# Patient Record
Sex: Male | Born: 1950 | Race: White | Hispanic: No | Marital: Married
Health system: Midwestern US, Academic
[De-identification: ages and names within clinical notes are randomized; demographics above are authoritative.]

---

## 2016-09-20 ENCOUNTER — Encounter: Admit: 2016-09-20 | Discharge: 2016-09-20 | Payer: MEDICARE

## 2016-10-08 ENCOUNTER — Encounter: Admit: 2016-10-08 | Discharge: 2016-10-08 | Payer: MEDICARE

## 2016-10-08 ENCOUNTER — Ambulatory Visit: Admit: 2016-10-08 | Discharge: 2016-10-09 | Payer: MEDICARE

## 2016-10-08 DIAGNOSIS — K760 Fatty (change of) liver, not elsewhere classified: ICD-10-CM

## 2016-10-08 DIAGNOSIS — I428 Other cardiomyopathies: ICD-10-CM

## 2016-10-08 DIAGNOSIS — M5416 Radiculopathy, lumbar region: ICD-10-CM

## 2016-10-08 DIAGNOSIS — G609 Hereditary and idiopathic neuropathy, unspecified: ICD-10-CM

## 2016-10-08 DIAGNOSIS — I2510 Atherosclerotic heart disease of native coronary artery without angina pectoris: ICD-10-CM

## 2016-10-08 DIAGNOSIS — I2699 Other pulmonary embolism without acute cor pulmonale: ICD-10-CM

## 2016-10-08 DIAGNOSIS — N529 Male erectile dysfunction, unspecified: ICD-10-CM

## 2016-10-08 DIAGNOSIS — E785 Hyperlipidemia, unspecified: ICD-10-CM

## 2016-10-08 DIAGNOSIS — E669 Obesity, unspecified: ICD-10-CM

## 2016-10-08 DIAGNOSIS — I10 Essential (primary) hypertension: ICD-10-CM

## 2016-10-08 DIAGNOSIS — M1990 Unspecified osteoarthritis, unspecified site: ICD-10-CM

## 2016-10-08 DIAGNOSIS — I712 Thoracic aortic aneurysm, without rupture: Principal | ICD-10-CM

## 2016-10-08 MED ORDER — TIZANIDINE 2 MG PO TAB
2 mg | ORAL_TABLET | Freq: Two times a day (BID) | ORAL | 4 refills | Status: AC | PRN
Start: 2016-10-08 — End: 2016-10-08

## 2016-10-08 MED ORDER — TIZANIDINE 2 MG PO TAB
2 mg | ORAL_TABLET | Freq: Two times a day (BID) | ORAL | 4 refills | Status: AC | PRN
Start: 2016-10-08 — End: 2017-01-14

## 2016-10-08 NOTE — Telephone Encounter
Reorder to correct pharmacy. [facility] does not carry this medication

## 2016-10-08 NOTE — Progress Notes
SPINE CENTER CLINIC NOTE  Subjective     SUBJECTIVE: Mr. Jelle presents in follow-up primary care regarding back and lower extremity pain.  Pain is in the bilateral lumbar region describes intermittently sharp and dull.  Mr. Falzon trialed a two-week period where spinal cord stimulator was often notes that he did worse with gait in terms of weakness and pain.  He has resumed stimulation therapy.  For pain relief he is taking Mobic twice daily and tramadol 2-3 times per day and notes his pain is moderate.         Review of Systems   Constitutional: Negative.    HENT: Negative.    Eyes: Negative.    Respiratory: Negative.    Cardiovascular: Negative.    Gastrointestinal: Negative.    Endocrine: Negative.    Genitourinary: Negative.    Musculoskeletal: Positive for arthralgias, back pain and gait problem.   Skin: Positive for color change.   Allergic/Immunologic: Negative.    Hematological: Negative.    Psychiatric/Behavioral: Negative.    All other systems reviewed and are negative.      Current Outpatient Prescriptions:   ???  carvedilol (COREG) 25 mg tablet, Take 1/2 tablet by mouth in the morning and 1 tablet in the evening with dinner., Disp: 135 Tab, Rfl: 3  ???  hydroxychloroquine (PLAQUENIL) 200 mg tablet, Take 1 tablet by mouth twice daily. Take with food., Disp: 180 tablet, Rfl: 1  ???  lisinopril (PRINIVIL; ZESTRIL) 10 mg tablet, Take 1 Tab by mouth daily., Disp: 90 Tab, Rfl: 3  ???  meloxicam (MOBIC) 15 mg tablet, Take 15 mg by mouth as Needed., Disp: , Rfl:   ???  metFORMIN (GLUCOPHAGE) 500 mg tablet, Take 500 mg by mouth twice daily., Disp: , Rfl:   ???  pregabalin (LYRICA) 150 mg capsule, Take 1 capsule by mouth twice daily., Disp: 180 capsule, Rfl: 0  ???  rivaroxaban (XARELTO) 20 mg tablet, Take 1 Tab by mouth daily with dinner., Disp: 90 Tab, Rfl: 3  ???  SIMVASTATIN (ZOCOR PO), Take 20 mg by mouth at bedtime daily., Disp: , Rfl:   ???  spironolactone (ALDACTONE) 25 mg tablet, Take 1 tablet by mouth daily., Disp: 90 tablet, Rfl: 3  ???  tiZANidine (ZANAFLEX) 2 mg tablet, Take 1 tablet by mouth twice daily as needed., Disp: 60 tablet, Rfl: 4  ???  traMADol (ULTRAM) 50 mg tablet, Take 1 tablet by mouth every 8 hours as needed for Pain., Disp: 90 tablet, Rfl: 3  Allergies   Allergen Reactions   ??? Other [Unclassified Drug] BLISTERS     dissolving stiches in mouth after dental surgery caused blisters   ??? Lipitor [Atorvastatin] FLUSHING (SKIN)   ??? Niaspan [Niacin] FLUSHING (SKIN)   ??? Rubber SEE COMMENTS     Neoprene rubber causes contact dermatitis (CAUSED BY THIOUREA IN NEOPRENE)     Physical Exam  Vitals:    10/08/16 1238   BP: 116/76   Pulse: 78   SpO2: 100%   Weight: 133.4 kg (294 lb)   Height: 177.8 cm (70)     Oswestry Total Score:: 34  Pain Score: Five  Body mass index is 42.18 kg/m???.  General: Alert and oriented, very pleasant male.   HEENT showed extraocular muscles were intact and no other abnormalities.  Unlabored breathing.  Regular rate and rhythm on CV exam.   5/5 strength in bilateral upper and lower extremities.    Sensation is intact to light touch and equal in the upper  and lower extremities.         IMPRESSION:  1. Lumbar radiculopathy          PLAN: Will organize physical therapy in the [facility] and a 2 month clinic follow-up.  I am adding tizanidine 2 mg twice per day.

## 2016-10-15 ENCOUNTER — Encounter: Admit: 2016-10-15 | Discharge: 2016-10-15 | Payer: MEDICARE

## 2016-10-15 MED ORDER — CARVEDILOL 25 MG PO TAB
ORAL_TABLET | ORAL | 3 refills | 90.00000 days | Status: AC
Start: 2016-10-15 — End: 2017-04-24

## 2016-10-22 ENCOUNTER — Encounter: Admit: 2016-10-22 | Discharge: 2016-11-06 | Payer: MEDICARE

## 2016-10-22 DIAGNOSIS — R760 Raised antibody titer: Secondary | ICD-10-CM

## 2016-10-22 DIAGNOSIS — M19139 Post-traumatic osteoarthritis, unspecified wrist: Secondary | ICD-10-CM

## 2016-10-23 ENCOUNTER — Encounter: Admit: 2016-10-23 | Discharge: 2016-10-23 | Payer: MEDICARE

## 2016-10-23 DIAGNOSIS — M5416 Radiculopathy, lumbar region: ICD-10-CM

## 2016-10-23 DIAGNOSIS — G609 Hereditary and idiopathic neuropathy, unspecified: Principal | ICD-10-CM

## 2016-10-23 MED ORDER — PREGABALIN 150 MG PO CAP
150 mg | ORAL_CAPSULE | Freq: Two times a day (BID) | ORAL | 1 refills | Status: AC
Start: 2016-10-23 — End: 2017-04-25

## 2016-10-23 NOTE — Telephone Encounter
Patient last saw Dr. Alric Ran on 08/06/16, rtc in 12 months, fuv scheduled for 08/06/17.  Continue lyrica as stated in clin note on 08/06/16.

## 2016-10-29 ENCOUNTER — Encounter: Admit: 2016-10-29 | Discharge: 2016-10-29 | Payer: MEDICARE

## 2016-10-29 DIAGNOSIS — M1990 Unspecified osteoarthritis, unspecified site: Secondary | ICD-10-CM

## 2016-10-29 DIAGNOSIS — R760 Raised antibody titer: Secondary | ICD-10-CM

## 2016-10-29 DIAGNOSIS — M19139 Post-traumatic osteoarthritis, unspecified wrist: Secondary | ICD-10-CM

## 2016-10-29 MED ORDER — RIVAROXABAN 20 MG PO TAB
20 mg | ORAL_TABLET | Freq: Every day | ORAL | 3 refills | 30.00000 days | Status: AC
Start: 2016-10-29 — End: 2017-11-04

## 2016-11-01 ENCOUNTER — Encounter: Admit: 2016-11-01 | Discharge: 2016-11-01 | Payer: MEDICARE

## 2016-11-02 ENCOUNTER — Encounter: Admit: 2016-11-02 | Discharge: 2016-11-02 | Payer: MEDICARE

## 2016-11-02 MED ORDER — MELOXICAM 15 MG PO TAB
15 mg | ORAL_TABLET | Freq: Every day | ORAL | 2 refills | 30.00000 days | Status: AC | PRN
Start: 2016-11-02 — End: 2017-06-07

## 2016-11-06 ENCOUNTER — Ambulatory Visit: Admit: 2016-11-06 | Discharge: 2016-11-06 | Payer: MEDICARE

## 2016-11-06 ENCOUNTER — Encounter: Admit: 2016-11-06 | Discharge: 2016-11-06 | Payer: MEDICARE

## 2016-11-06 DIAGNOSIS — M48061 Spinal stenosis, lumbar region without neurogenic claudication: Secondary | ICD-10-CM

## 2016-11-06 DIAGNOSIS — M5416 Radiculopathy, lumbar region: ICD-10-CM

## 2016-11-06 DIAGNOSIS — E785 Hyperlipidemia, unspecified: Secondary | ICD-10-CM

## 2016-11-06 DIAGNOSIS — M19139 Post-traumatic osteoarthritis, unspecified wrist: ICD-10-CM

## 2016-11-06 DIAGNOSIS — E669 Obesity, unspecified: ICD-10-CM

## 2016-11-06 DIAGNOSIS — I2510 Atherosclerotic heart disease of native coronary artery without angina pectoris: ICD-10-CM

## 2016-11-06 DIAGNOSIS — M545 Low back pain: Secondary | ICD-10-CM

## 2016-11-06 DIAGNOSIS — R768 Other specified abnormal immunological findings in serum: ICD-10-CM

## 2016-11-06 DIAGNOSIS — R760 Raised antibody titer: ICD-10-CM

## 2016-11-06 DIAGNOSIS — M1990 Unspecified osteoarthritis, unspecified site: ICD-10-CM

## 2016-11-06 DIAGNOSIS — G609 Hereditary and idiopathic neuropathy, unspecified: ICD-10-CM

## 2016-11-06 DIAGNOSIS — I428 Other cardiomyopathies: ICD-10-CM

## 2016-11-06 DIAGNOSIS — I2699 Other pulmonary embolism without acute cor pulmonale: ICD-10-CM

## 2016-11-06 DIAGNOSIS — I10 Essential (primary) hypertension: ICD-10-CM

## 2016-11-06 DIAGNOSIS — I712 Thoracic aortic aneurysm, without rupture: Principal | ICD-10-CM

## 2016-11-06 DIAGNOSIS — N529 Male erectile dysfunction, unspecified: ICD-10-CM

## 2016-11-06 DIAGNOSIS — K760 Fatty (change of) liver, not elsewhere classified: ICD-10-CM

## 2016-11-06 MED ORDER — HYDROXYCHLOROQUINE 200 MG PO TAB
200 mg | ORAL_TABLET | Freq: Two times a day (BID) | ORAL | 1 refills | 90.00000 days | Status: AC
Start: 2016-11-06 — End: 2017-03-20

## 2016-11-06 NOTE — Progress Notes
Date of Service: 11/06/2016        Subjective:             Victor Graham is a 66 y.o. male.    Rheum hx: (from the initial visit 03/30/16)    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    Patient reports that he has had arthritis in his wrists, knees, and lower back for several years.  He underwent L4-5 fusion in 2010 and had a spinal cord stimulator placed in 2016.  He also underwent bilateral knee arthroplasty.  Currently he complains of pain in bilateral wrists.  When he moves his right wrist, he also has pain in his index finger.  He endorse constant swelling over his right wrist.  These symptoms have been present for the last few months.  He had called and set up an appointment with Dr. Aundria Rud, an orthopedist in [place] and has an appointment on 04/13/16.  He denies pain in other joints.  He denies morning stiffness. He has no history of gout or pseudogout.  He takes meloxicam and tramadol for the pain, which have helped.    He has history of idiopathic peripheral neuropathy for which he follows with Dr. Sunday Corn in neurology clinic at [facility].  He states that he developed numbness starting in bilateral big toe after his back surgery in 2010.  However, over the last 2-3 years, it has progressed up to above his ankle level.  He notes numbness on the palmar aspect of his left thumb, which he attributes to previous saw injury.  The right thumb also occasionally feels numb.    Furthermore, he reports history of pulmonary embolism that was discovered incidentally on CT 2 years ago.  He had undergone AAA repair and underwent the CT for routine surveillance.  He reports that because there was no aggravating factor identified, he was recommended to stay on anticoagulation (xarelto) for life.  He has not been evaluated by hematology.    He saw Dr. Juanita Craver, an outside rheumatologist, in 12/2015.  He reports that he was prescribed Plaquenil as a trial/preventive measure for high ANA.  He has been taking it as prescribed, but has not noted any improvement.  He brought the lab results from Dr. Juanita Craver:  ANA 1:160, homogeneous, MPO/PR3 neg, ANCA neg, ESR 2, CRP 0.15, dsDNA neg, RF neg, anti-scl70 neg, anti smith neg, RNP neg, SSA neg, SSB neg, anti-CCP neg    He denies dry eyes, but complains of dry mouth.  He denies any recurrent oral ulcers, alopecia, pleuritic chest pain, photosensitivity, Raynaud's.  He denies other history of blood clot.    Past surgical history:  1.  Fasciotomy for compartment syndrome in the right calf in June 1993  2.  Arthroscopic surgery, remove torn meniscus in the right knee in November 2008  3.  L4-5 vertebral fusion because of herniated disc November 2009  4.  Ascending aortic aneurysm repair March 2000  5.  Right total knee replacement March 2012  6.  Laparoscopic double hernia repair October 2014  7.  Left total knee replacement December 2014  8.  Ganglion cyst removal, left breast March 2015  9.  [employer] spinal cord stimulator implant in lower back in September 2016  History of Present Illness    Patient reports that his joint symptoms have largely been unchanged since the last clinic visit.  He has good days and bad days.  The numbers of good days and bad days are about the same.  Today he complains of pain in his right wrist that has been present for 2 days.  He states that the pain is usually worse in his left wrist.  He received a cortisone injection to his left wrist from his outside orthopedist in 07/2016 and has noted great benefits.  He was also prescribed Voltaren gel.  He has not had any pain in L wrist since the injection.  He complains of lower back pain, but denies pain in other joints.  His morning stiffness lasts 10-15 minutes.  He takes tramadol qd-bid, wears braces and meloxicam daily for pain.  The skin discoloration over his shins has been about the same.    He is going to physical therapy for his back as recommended by Dr. Samara Deist.     Denies f/c, recent infection, weight loss, decreased appetite, SOB/cough, n/v/d/c, rash.     Meds:  - HCQ 200 mg bid (eye exam 07/19/16 at [facility] care -- outside records reviewed)   - tramadol 50 mg qd-bid  - meloxicam 15 mg daily        Review of Systems    HENT: Positive for tinnitus.    Musculoskeletal: Positive for arthralgias, back pain and gait problem.   Neurological: Positive for numbness.   All other systems reviewed and are negative.      Past Medical History:   Diagnosis Date   ??? Arthritis    ??? Ascending aortic aneurysm (HCC) 01/08/2008   ??? CAD (coronary artery disease)    ??? Dyslipidemia    ??? Erectile dysfunction    ??? Fatty liver    ??? HLD (hyperlipidemia) 01/08/2008   ??? Hypertension 01/08/2008   ??? Idiopathic polyneuropathy    ??? Lumbar radiculopathy    ??? NICM (nonischemic cardiomyopathy) (HCC)    ??? Obesity    ??? Pulmonary embolism [facility]) 2015 -- july 20th    takes xaralto anticoagulant -- treated at [place]     Past Surgical History:   Procedure Laterality Date   ??? FASCIOTOMY Right 1992   ??? SPINAL FUSION  01/2008    for low back pain   ??? HERNIA REPAIR     ??? HX JOINT REPLACEMENT     ??? KNEE SURGERY     ??? PR LAPAROSCOPY SURG RPR INITIAL INGUINAL HERNIA           MEDS:        ??? carvedilol (COREG) 25 mg tablet Take 1/2 tablet by mouth in the morning and 1 tablet in the evening with dinner.   ??? hydroxychloroquine (PLAQUENIL) 200 mg tablet Take 1 tablet by mouth twice daily. Take with food.   ??? lisinopril (PRINIVIL; ZESTRIL) 10 mg tablet Take 1 Tab by mouth daily.   ??? meloxicam (MOBIC) 15 mg tablet Take 1 tablet by mouth daily as needed. ??? metFORMIN (GLUCOPHAGE) 500 mg tablet Take 500 mg by mouth twice daily.   ??? pregabalin (LYRICA) 150 mg capsule Take 1 capsule by mouth twice daily.   ??? rivaroxaban (XARELTO) 20 mg tablet Take 1 tablet by mouth daily with dinner.   ??? SIMVASTATIN (ZOCOR PO) Take 20 mg by mouth at bedtime daily.   ??? spironolactone (ALDACTONE) 25 mg tablet Take 1 tablet by mouth daily.   ???  tiZANidine (ZANAFLEX) 2 mg tablet Take 1 tablet by mouth twice daily as needed.   ??? traMADol (ULTRAM) 50 mg tablet Take 1 tablet by mouth every 8 hours as needed for Pain.       Allergies:   Allergies   Allergen Reactions   ??? Other [Unclassified Drug] BLISTERS     dissolving stiches in mouth after dental surgery caused blisters   ??? Lipitor [Atorvastatin] FLUSHING (SKIN)   ??? Niaspan [Niacin] FLUSHING (SKIN)   ??? Rubber SEE COMMENTS     Neoprene rubber causes contact dermatitis (CAUSED BY THIOUREA IN NEOPRENE)       Social hx:  - Denies tobacco, Etoh and illicit drug use  - he does wood work for a hobby.     Family hx:  - Parents, brothers with arthritis  - Denies known family history of lupus or other autoimmune diseases.      Objective:   Vitals:    11/06/16 0826   BP: 112/54   Pulse: 68   Temp: 37.1 ???C (98.7 ???F)   TempSrc: Oral   SpO2: 98%   Weight: (!) 137 kg (302 lb)   Height: 177.8 cm (70)     Body mass index is 43.33 kg/m???. and falls within the category of Obesity 3 (>40); specialist visit only, referred back to Primary Care Provider for follow up.      Physical Exam    GEN: Well developed.  Well nourished.  Appropriate affect.  No apparent distress.  Alert.    HEENT: mildly dry mucous membranes.   No oral sores.  Normal exam of external ear. Normal hearing. No parotid or submandibular gland swelling.   EYES: normal conjunctiva.  EOMI.    RESP: Clear to auscultation bilaterally.  No wheezes.  No crackles.  Normal respiratory effort.   CV: regular rate and rhythm.  No murmurs.   EXT:  pitting edema B/L.  Warm. ABD: Soft.  Nontender.  No distension.  No rebound tenderness.      SKIN: +dark skin discoloration over the anterior aspect of R lower leg. No alopecia.  Normal nailfold capillaries. No sclerodactyly, telangiectasia, calcinosis.     LYMPH: No enlarged cervical lymph nodes  NEURO: decreased sensation in a stocking distribution up to the 1/3 lower leg B/L.   MsK:    Spine: Normal ROM of cervical spine.      Shoulders: FROM, no tenderness.     Elbows: FROM, no synovitis.     Wrists:  R wrist w/ swelling, TTP, pain w/ ROM and mild warmth. L wrist w/ no TTP, minimal swelling.      Hands: no synovitis.      Hips: Normal pain-free ROM    Knees: Normal pain-free ROM. No synovitis/effusion/warmth is noted bilaterally. Old TKA scar is noted on B/L knees.     Ankles: FROM, no synovitis.     Feet: no synovitis. No TTP (pt has neuropathy).       LABS:  08/2015 ANA>=1280*, RF neg  SPEP normal, no paraprotein   ESR 4    03/2016   RF neg, anti-centromere neg, scl70 neg  B2 glycoprotein IgG neg, IgM neg, anti-cardiolipin IgG neg, IgM 21.8*, DRVVT neg, hexagonal lupus anticoagulant 14*    06/2016 anti-cardiolipin IgG neg, IgM neg, hexagonal lupus anticoagulant 16*      IMAGING:   MRI L-spine (08/2013) 1. AT L1-L2 COMBINATION OF DEGENERATIVE CHANGES RESULTS IN MODERATE CENTRAL STENOSIS.   2. AT L2-L3 A COMBINATION OF DEGENERATIVE CHANGES  RESULTS IN MODERATE TO SEVERE CENTRAL STENOSIS, MODERATE BILATERAL FORAMINAL STENOSIS.   3. AT L3-L4 COMBINATION OF DEGENERATIVE CHANGES RESULTS IN SEVERE RIGHT LATERAL AND RIGHT FORAMINAL STENOSIS (FROM AND INTRAFORAMINAL DISC HERNIATION), MODERATE TO SEVERE CENTRAL STENOSIS, AND MODERATE LEFT FORAMINAL STENOSIS.   4. AT L4-L5 AND L5-S1 DISC BULGES RESULT IN MODERATE LEFT FORAMINAL STENOSIS.   5. CONGENITAL SPINAL STENOSIS FURTHER ACCENTUATES ALL OF THE ABOVE DEGENERATIVE FINDINGS.     XR pelvis (08/2013) MILD DEGENERATIVE CHANGES OF BILATERAL HIPS.       EMG (09/2015) 1.  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  There was no evidence for primary demyelination.  2.  There was also evidence suggestive of an active right low lumbar radiculopathy.  However, this needs to be clinically correlated since he has had prior lumbar surgery and subsequent EMG of paraspinals muscles has lower specificity.  3.  There was no clear evidence for left lumbar radiculopathy or other abnormal neuromuscular process.  This study was different from prior study in 07/2014 given its absence of sural responses and active lumbar radiculopathy.  Previous studies commented on bilateral chronic lumbar radiculopathy which I did not appreciate on this study.    Outside records reviewed  Labs 10/07/15: BUN 26, creatinine 0.98, AST 34, ALT 46*(0-44)  Hemoglobin A1c 6.4, PSA 1.6    Care Everywhere    CT L wrist (05/2015) There is a distal tuft fracture of the third digit. No radiopaque foreign body is identified. The wrist shows arthrosis and scapholunate advanced collapse.    XR B/L wrists (06/2013) 1. No acute fracture or dislocation.  2. Possible chronic avulsion fracture fragment of the radial styloid process in the left wrist in the area of interest as discussed above.   3. DJD.         Assessment and Plan:      Mr. 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 Pt returns for f/u.     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   - x-rays B/L wrists showed SLAC arthropathy which could be due to previous trauma or pseudogout. 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   - pt follows w/ Dr. Aundria Rud, an outside orthopedist and was recommended to have surgery for wrist fusion but pt declined.   - he was given IA steroid injection to L wrist by Dr. Aundria Rud and has noted substantial improvement.   - since only one joint is being affected and pt has responded well to a local IA injection, pt is advised to contact Dr. Aundria Rud for consideration of IA injection to R wrist if the  pain/swelling persists or worsens. Pt states that currently the pain in R wrist is relatively mild and he does not want an injection at this time.   - If patient develops joint pain/swelling involving multiple joints, he is asked to contact the clinic. Consider adding colchicine or sulfasalazine.  - Recommend that patient continue taking hydroxychloroquine for his inflammatory arthritis.   - pt is up to date on eye exam (06/2016 at [facility] care -- outside records reviewed.   - previously explained to the pt that his skin discoloration could be related to HCQ. Pt verbalized understanding and opted to continue HCQ.    - pt was noted to have persistently elevated hexagonal lupus anticoagulant.  Given history of unprovoked PE, it may be prudent to continue with lifelong anticoagulation as long as potential benefits outweigh risks.   - continue to follow w/ neurology for idiopathic peripheral neuropathy.       RTC 4-6 months w/ new physician     Orders Placed This Encounter ??? hydroxychloroquine (PLAQUENIL) 200 mg tablet

## 2016-11-07 ENCOUNTER — Encounter: Admit: 2016-11-07 | Discharge: 2016-11-07 | Payer: MEDICARE

## 2016-11-07 DIAGNOSIS — M5416 Radiculopathy, lumbar region: ICD-10-CM

## 2016-11-07 DIAGNOSIS — G609 Hereditary and idiopathic neuropathy, unspecified: ICD-10-CM

## 2016-11-07 DIAGNOSIS — N529 Male erectile dysfunction, unspecified: ICD-10-CM

## 2016-11-07 DIAGNOSIS — E669 Obesity, unspecified: ICD-10-CM

## 2016-11-07 DIAGNOSIS — I10 Essential (primary) hypertension: ICD-10-CM

## 2016-11-07 DIAGNOSIS — E785 Hyperlipidemia, unspecified: Secondary | ICD-10-CM

## 2016-11-07 DIAGNOSIS — I428 Other cardiomyopathies: ICD-10-CM

## 2016-11-07 DIAGNOSIS — K760 Fatty (change of) liver, not elsewhere classified: ICD-10-CM

## 2016-11-07 DIAGNOSIS — M1990 Unspecified osteoarthritis, unspecified site: ICD-10-CM

## 2016-11-07 DIAGNOSIS — I712 Thoracic aortic aneurysm, without rupture: Principal | ICD-10-CM

## 2016-11-07 DIAGNOSIS — I2699 Other pulmonary embolism without acute cor pulmonale: ICD-10-CM

## 2016-11-07 DIAGNOSIS — I2510 Atherosclerotic heart disease of native coronary artery without angina pectoris: ICD-10-CM

## 2016-11-12 ENCOUNTER — Encounter: Admit: 2016-11-12 | Discharge: 2016-12-07 | Payer: MEDICARE

## 2016-11-26 ENCOUNTER — Encounter: Admit: 2016-11-26 | Discharge: 2016-11-26 | Payer: MEDICARE

## 2016-11-26 DIAGNOSIS — G609 Hereditary and idiopathic neuropathy, unspecified: Secondary | ICD-10-CM

## 2016-12-06 ENCOUNTER — Encounter: Admit: 2016-12-06 | Discharge: 2016-12-06 | Payer: MEDICARE

## 2016-12-07 ENCOUNTER — Encounter: Admit: 2016-11-19 | Discharge: 2016-11-19 | Payer: MEDICARE

## 2016-12-07 DIAGNOSIS — G609 Hereditary and idiopathic neuropathy, unspecified: Secondary | ICD-10-CM

## 2016-12-07 DIAGNOSIS — M5416 Radiculopathy, lumbar region: Principal | ICD-10-CM

## 2016-12-07 DIAGNOSIS — M545 Low back pain: ICD-10-CM

## 2016-12-12 ENCOUNTER — Ambulatory Visit: Admit: 2016-12-12 | Discharge: 2016-12-13 | Payer: MEDICARE

## 2016-12-12 ENCOUNTER — Encounter: Admit: 2016-12-12 | Discharge: 2016-12-12 | Payer: MEDICARE

## 2016-12-12 DIAGNOSIS — M5116 Intervertebral disc disorders with radiculopathy, lumbar region: Principal | ICD-10-CM

## 2016-12-12 DIAGNOSIS — E669 Obesity, unspecified: ICD-10-CM

## 2016-12-12 DIAGNOSIS — M5416 Radiculopathy, lumbar region: ICD-10-CM

## 2016-12-12 DIAGNOSIS — I712 Thoracic aortic aneurysm, without rupture: Principal | ICD-10-CM

## 2016-12-12 DIAGNOSIS — K760 Fatty (change of) liver, not elsewhere classified: ICD-10-CM

## 2016-12-12 DIAGNOSIS — I2699 Other pulmonary embolism without acute cor pulmonale: ICD-10-CM

## 2016-12-12 DIAGNOSIS — G609 Hereditary and idiopathic neuropathy, unspecified: ICD-10-CM

## 2016-12-12 DIAGNOSIS — M1990 Unspecified osteoarthritis, unspecified site: ICD-10-CM

## 2016-12-12 DIAGNOSIS — E785 Hyperlipidemia, unspecified: Secondary | ICD-10-CM

## 2016-12-12 DIAGNOSIS — I10 Essential (primary) hypertension: ICD-10-CM

## 2016-12-12 DIAGNOSIS — I428 Other cardiomyopathies: ICD-10-CM

## 2016-12-12 DIAGNOSIS — I2510 Atherosclerotic heart disease of native coronary artery without angina pectoris: ICD-10-CM

## 2016-12-12 DIAGNOSIS — N529 Male erectile dysfunction, unspecified: ICD-10-CM

## 2016-12-12 NOTE — Progress Notes
SPINE CENTER CLINIC NOTE  Subjective     SUBJECTIVE: bilateral lumbar pain, the [employer] spinal cord stimulator is helping somewhat.  Victor Graham has made good strides with physical therapy.  He is scheduled to start aquatic therapy.  Walking and standing exacerbates the pain which is in the posterior thighs and calves.         Review of Systems   Constitutional: Negative.    HENT: Negative.    Eyes: Negative.    Respiratory: Negative.    Cardiovascular: Negative.    Gastrointestinal: Negative.    Endocrine: Negative.    Genitourinary: Negative.    Musculoskeletal: Negative.    Skin: Negative.    Allergic/Immunologic: Negative.    Neurological: Negative.    Hematological: Negative.    Psychiatric/Behavioral: Negative.    All other systems reviewed and are negative.      Current Outpatient Prescriptions:   ???  carvedilol (COREG) 25 mg tablet, Take 1/2 tablet by mouth in the morning and 1 tablet in the evening with dinner., Disp: 135 tablet, Rfl: 3  ???  hydroxychloroquine (PLAQUENIL) 200 mg tablet, Take 1 tablet by mouth twice daily. Take with food., Disp: 180 tablet, Rfl: 1  ???  lisinopril (PRINIVIL; ZESTRIL) 10 mg tablet, Take 1 Tab by mouth daily., Disp: 90 Tab, Rfl: 3  ???  meloxicam (MOBIC) 15 mg tablet, Take 1 tablet by mouth daily as needed., Disp: 90 tablet, Rfl: 2  ???  metFORMIN (GLUCOPHAGE) 500 mg tablet, Take 500 mg by mouth twice daily., Disp: , Rfl:   ???  pregabalin (LYRICA) 150 mg capsule, Take 1 capsule by mouth twice daily., Disp: 180 capsule, Rfl: 1  ???  rivaroxaban (XARELTO) 20 mg tablet, Take 1 tablet by mouth daily with dinner., Disp: 90 tablet, Rfl: 3  ???  SIMVASTATIN (ZOCOR PO), Take 20 mg by mouth at bedtime daily., Disp: , Rfl:   ???  spironolactone (ALDACTONE) 25 mg tablet, Take 1 tablet by mouth daily., Disp: 90 tablet, Rfl: 3  ???  tiZANidine (ZANAFLEX) 2 mg tablet, Take 1 tablet by mouth twice daily as needed., Disp: 60 tablet, Rfl: 4 ???  traMADol (ULTRAM) 50 mg tablet, Take 1 tablet by mouth every 8 hours as needed for Pain., Disp: 90 tablet, Rfl: 3  Allergies   Allergen Reactions   ??? Other [Unclassified Drug] BLISTERS     dissolving stiches in mouth after dental surgery caused blisters   ??? Lipitor [Atorvastatin] FLUSHING (SKIN)   ??? Niaspan [Niacin] FLUSHING (SKIN)   ??? Rubber SEE COMMENTS     Neoprene rubber causes contact dermatitis (CAUSED BY THIOUREA IN NEOPRENE)     Physical Exam  Vitals:    12/12/16 1222   BP: 121/72   Pulse: 74   Weight: 133.8 kg (295 lb)   Height: 177.8 cm (70)        Pain Score: Four  Body mass index is 42.33 kg/m???.  General: Alert and oriented, very pleasant male.   HEENT showed extraocular muscles were intact and no other abnormalities.  Unlabored breathing.  Regular rate and rhythm on CV exam.   5/5 strength in bilateral upper and lower extremities.    Sensation is intact to light touch and equal in the upper and lower extremities.  Bilateral lumbar tenderness to palpation       IMPRESSION:  1. Lumbar disc disease with radiculopathy    2. Lumbar radiculopathy          PLAN:  Will schedule a 2  month clinic follow up and continue with tramadol on a prn basis.  Lyrica 150 mg po bid does a good job of reducing his pain.

## 2016-12-13 DIAGNOSIS — Z7901 Long term (current) use of anticoagulants: ICD-10-CM

## 2016-12-13 DIAGNOSIS — Z7984 Long term (current) use of oral hypoglycemic drugs: ICD-10-CM

## 2016-12-17 ENCOUNTER — Encounter: Admit: 2016-12-17 | Discharge: 2017-01-06 | Payer: MEDICARE

## 2017-01-06 DIAGNOSIS — M545 Low back pain: ICD-10-CM

## 2017-01-06 DIAGNOSIS — G609 Hereditary and idiopathic neuropathy, unspecified: ICD-10-CM

## 2017-01-06 DIAGNOSIS — M5416 Radiculopathy, lumbar region: Principal | ICD-10-CM

## 2017-01-08 ENCOUNTER — Encounter: Admit: 2017-01-08 | Discharge: 2017-02-06 | Payer: MEDICARE

## 2017-01-14 ENCOUNTER — Encounter: Admit: 2017-01-14 | Discharge: 2017-01-14 | Payer: MEDICARE

## 2017-01-14 MED ORDER — TIZANIDINE 2 MG PO TAB
ORAL_TABLET | Freq: Two times a day (BID) | 4 refills | Status: AC | PRN
Start: 2017-01-14 — End: 2017-04-23

## 2017-01-14 NOTE — Telephone Encounter
TIZANIDINE HCL TABS 2MG   Will file in chart as: tiZANidine (ZANAFLEX) 2 mg tablet  TAKE 1 TABLET TWICE A DAY AS NEEDED       Disp: 60 tablet Refills: 4    Class: Normal Start: 01/14/2017   Originally ordered: 3 months ago by Clovis Cao, MD  Last refill: 10/09/2016  LOV 12/09/2016  FUV 02/18/17

## 2017-01-28 ENCOUNTER — Encounter: Admit: 2017-01-28 | Discharge: 2017-01-28 | Payer: MEDICARE

## 2017-01-28 DIAGNOSIS — I428 Other cardiomyopathies: Principal | ICD-10-CM

## 2017-01-28 MED ORDER — SPIRONOLACTONE 25 MG PO TAB
25 mg | ORAL_TABLET | Freq: Every day | ORAL | 0 refills | 46.00000 days | Status: AC
Start: 2017-01-28 — End: 2017-01-28

## 2017-01-28 MED ORDER — SPIRONOLACTONE 25 MG PO TAB
25 mg | ORAL_TABLET | Freq: Every day | ORAL | 0 refills | 90.00000 days | Status: AC
Start: 2017-01-28 — End: 2017-05-06

## 2017-01-28 NOTE — Telephone Encounter
Notified Victor Graham that refill had been sent.

## 2017-02-06 DIAGNOSIS — G609 Hereditary and idiopathic neuropathy, unspecified: ICD-10-CM

## 2017-02-06 DIAGNOSIS — M545 Low back pain: ICD-10-CM

## 2017-02-06 DIAGNOSIS — M5416 Radiculopathy, lumbar region: Principal | ICD-10-CM

## 2017-02-13 ENCOUNTER — Encounter: Admit: 2017-02-13 | Discharge: 2017-03-08 | Payer: MEDICARE

## 2017-02-13 DIAGNOSIS — M5416 Radiculopathy, lumbar region: Principal | ICD-10-CM

## 2017-02-20 ENCOUNTER — Encounter: Admit: 2017-02-20 | Discharge: 2017-02-20 | Payer: MEDICARE

## 2017-02-20 ENCOUNTER — Ambulatory Visit: Admit: 2017-02-20 | Discharge: 2017-02-21 | Payer: MEDICARE

## 2017-02-20 DIAGNOSIS — I428 Other cardiomyopathies: ICD-10-CM

## 2017-02-20 DIAGNOSIS — E785 Hyperlipidemia, unspecified: Secondary | ICD-10-CM

## 2017-02-20 DIAGNOSIS — G609 Hereditary and idiopathic neuropathy, unspecified: ICD-10-CM

## 2017-02-20 DIAGNOSIS — K760 Fatty (change of) liver, not elsewhere classified: ICD-10-CM

## 2017-02-20 DIAGNOSIS — M1990 Unspecified osteoarthritis, unspecified site: ICD-10-CM

## 2017-02-20 DIAGNOSIS — I2510 Atherosclerotic heart disease of native coronary artery without angina pectoris: ICD-10-CM

## 2017-02-20 DIAGNOSIS — E669 Obesity, unspecified: ICD-10-CM

## 2017-02-20 DIAGNOSIS — I2699 Other pulmonary embolism without acute cor pulmonale: ICD-10-CM

## 2017-02-20 DIAGNOSIS — I712 Thoracic aortic aneurysm, without rupture: Principal | ICD-10-CM

## 2017-02-20 DIAGNOSIS — I10 Essential (primary) hypertension: ICD-10-CM

## 2017-02-20 DIAGNOSIS — M5416 Radiculopathy, lumbar region: ICD-10-CM

## 2017-02-20 DIAGNOSIS — N529 Male erectile dysfunction, unspecified: ICD-10-CM

## 2017-02-20 NOTE — Progress Notes
[facility] CLINIC NOTE  Subjective     SUBJECTIVE: Victor Graham has a history of bilateral lumbar pain, the Abbott spinal cord stimulator is helping somewhat.  Victor Graham has made good strides with physical therapy.  He is scheduled to start aquatic therapy.  Walking and standing exacerbates the pain which is in the posterior thighs and calves.  Victor Graham notes progressing weakness and numbness in the bilateral lower extremities right greater than left and is having difficulty ambulating         Review of Systems   All other systems reviewed and are negative.      Current Outpatient Prescriptions:   ???  carvedilol (COREG) 25 mg tablet, Take 1/2 tablet by mouth in the morning and 1 tablet in the evening with dinner., Disp: 135 tablet, Rfl: 3  ???  hydroxychloroquine (PLAQUENIL) 200 mg tablet, Take 1 tablet by mouth twice daily. Take with food., Disp: 180 tablet, Rfl: 1  ???  lisinopril (PRINIVIL; ZESTRIL) 10 mg tablet, Take 1 Tab by mouth daily., Disp: 90 Tab, Rfl: 3  ???  meloxicam (MOBIC) 15 mg tablet, Take 1 tablet by mouth daily as needed., Disp: 90 tablet, Rfl: 2  ???  metFORMIN (GLUCOPHAGE) 500 mg tablet, Take 500 mg by mouth twice daily., Disp: , Rfl:   ???  pregabalin (LYRICA) 150 mg capsule, Take 1 capsule by mouth twice daily., Disp: 180 capsule, Rfl: 1  ???  rivaroxaban (XARELTO) 20 mg tablet, Take 1 tablet by mouth daily with dinner., Disp: 90 tablet, Rfl: 3  ???  SIMVASTATIN (ZOCOR PO), Take 20 mg by mouth at bedtime daily., Disp: , Rfl:   ???  spironolactone (ALDACTONE) 25 mg tablet, Take one tablet by mouth daily. Please call to schedule office visit with Dr Iver Nestle, Disp: 90 tablet, Rfl: 0  ???  tiZANidine (ZANAFLEX) 2 mg tablet, TAKE 1 TABLET TWICE A DAY AS NEEDED, Disp: 60 tablet, Rfl: 4  ???  traMADol (ULTRAM) 50 mg tablet, Take 1 tablet by mouth every 8 hours as needed for Pain., Disp: 90 tablet, Rfl: 3  Allergies   Allergen Reactions   ??? Other [Unclassified Drug] BLISTERS dissolving stiches in mouth after dental surgery caused blisters   ??? Lipitor [Atorvastatin] FLUSHING (SKIN)   ??? Niaspan [Niacin] FLUSHING (SKIN)   ??? Rubber SEE COMMENTS     Neoprene rubber causes contact dermatitis (CAUSED BY THIOUREA IN NEOPRENE)     Physical Exam  Vitals:    02/20/17 0807   BP: 102/60   Pulse: 72   Resp: 22   Temp: 36.7 ???C (98 ???F)   TempSrc: Oral   SpO2: 94%   Weight: 133.4 kg (294 lb)   Height: 177.8 cm (70)     Oswestry Total Score:: (P) 44  Pain Score: Three  Body mass index is 42.18 kg/m???.  General: Alert and oriented, very pleasant male.   HEENT showed extraocular muscles were intact and no other abnormalities.  Unlabored breathing.  Regular rate and rhythm on CV exam.   5/5 strength in bilateral upper and lower extremities, except the right hip which is 4/5.    Sensation is intact to light touch and equal in the upper and lower extremities.  Examination of low back reveals bilateral tenderness right greater than left       IMPRESSION:  1. Lumbar radiculopathy    2. Lumbar disc disease with radiculopathy          PLAN: Will obtain an MRI of the lumbar  spine and surgery consultation with Dr. Lisette Grinder regarding progressing pain weakness and numbness in the back and lower extremities.  In the meantime Victor Graham will continue with tramadol on a as needed basis.

## 2017-02-20 NOTE — Progress Notes
Interventional Radiology Outpatient Scheduling Checklist      1.  Procedure:   L-spine myelo post CT      2.  Date of Procedure:   02/26/17      3.  Arrival Time:   1000      4.  Procedure Time:  1275      1.  Correct Procedural Room Assignment:  CA2      6.  Blood Thinners Triaged and instructed per protocol: Instructed to hold Xarelto 1 day prior to procedure      7.  Order Verified: Yes      8.  Patient informed regarding procedure: Yes      9.  Patient instructed to have a driver:  Yes     10.  Patient instructed on NPO status: No solids after 0300, clear liquids until 0900, NPO from 0900 until after procedure     11.  Specimen needed: Y/N: NA     12.  Allergies Verified:  Y/N:  Yes     13.  Iodine Allergy: Y/N:  If yes and receiving Iodine has the patient been premedicated: Y/N: No     14.  Does the patient have labs according to IR procedural policy: Y/N: Yes     15.  Will the patient need to be admitted/possible admission: Y/N:   NA     16.  If YES to possible admission has the patient been instructed: Y/N: NA     17.  Patient States Understanding:  Y/N  Yes     18.  Hx OSA:  Y/N: Pt instructed to bring PAP Machine:NA

## 2017-02-21 DIAGNOSIS — M5416 Radiculopathy, lumbar region: Principal | ICD-10-CM

## 2017-02-21 DIAGNOSIS — M5116 Intervertebral disc disorders with radiculopathy, lumbar region: Secondary | ICD-10-CM

## 2017-02-22 ENCOUNTER — Encounter: Admit: 2017-02-22 | Discharge: 2017-02-22 | Payer: MEDICARE

## 2017-02-26 ENCOUNTER — Encounter: Admit: 2017-02-26 | Discharge: 2017-02-26 | Payer: MEDICARE

## 2017-02-26 ENCOUNTER — Ambulatory Visit: Admit: 2017-02-26 | Discharge: 2017-02-26 | Payer: MEDICARE

## 2017-02-26 ENCOUNTER — Ambulatory Visit: Admit: 2017-02-26 | Discharge: 2017-02-27 | Payer: MEDICARE

## 2017-02-26 DIAGNOSIS — I712 Thoracic aortic aneurysm, without rupture: Principal | ICD-10-CM

## 2017-02-26 DIAGNOSIS — M1990 Unspecified osteoarthritis, unspecified site: ICD-10-CM

## 2017-02-26 DIAGNOSIS — Z7902 Long term (current) use of antithrombotics/antiplatelets: ICD-10-CM

## 2017-02-26 DIAGNOSIS — R7309 Other abnormal glucose: ICD-10-CM

## 2017-02-26 DIAGNOSIS — M5116 Intervertebral disc disorders with radiculopathy, lumbar region: Principal | ICD-10-CM

## 2017-02-26 DIAGNOSIS — I10 Essential (primary) hypertension: ICD-10-CM

## 2017-02-26 DIAGNOSIS — I428 Other cardiomyopathies: ICD-10-CM

## 2017-02-26 DIAGNOSIS — Z86711 Personal history of pulmonary embolism: ICD-10-CM

## 2017-02-26 DIAGNOSIS — K760 Fatty (change of) liver, not elsewhere classified: ICD-10-CM

## 2017-02-26 DIAGNOSIS — M4726 Other spondylosis with radiculopathy, lumbar region: ICD-10-CM

## 2017-02-26 DIAGNOSIS — Z7984 Long term (current) use of oral hypoglycemic drugs: ICD-10-CM

## 2017-02-26 DIAGNOSIS — Z981 Arthrodesis status: ICD-10-CM

## 2017-02-26 DIAGNOSIS — E785 Hyperlipidemia, unspecified: ICD-10-CM

## 2017-02-26 DIAGNOSIS — N529 Male erectile dysfunction, unspecified: ICD-10-CM

## 2017-02-26 DIAGNOSIS — Z87891 Personal history of nicotine dependence: ICD-10-CM

## 2017-02-26 DIAGNOSIS — M5416 Radiculopathy, lumbar region: Principal | ICD-10-CM

## 2017-02-26 DIAGNOSIS — G609 Hereditary and idiopathic neuropathy, unspecified: ICD-10-CM

## 2017-02-26 DIAGNOSIS — I2699 Other pulmonary embolism without acute cor pulmonale: ICD-10-CM

## 2017-02-26 DIAGNOSIS — I2510 Atherosclerotic heart disease of native coronary artery without angina pectoris: ICD-10-CM

## 2017-02-26 DIAGNOSIS — E669 Obesity, unspecified: ICD-10-CM

## 2017-02-26 MED ORDER — TRAMADOL 50 MG PO TAB
50 mg | Freq: Once | ORAL | 0 refills | Status: CP
Start: 2017-02-26 — End: ?
  Administered 2017-02-26: 17:00:00 50 mg via ORAL

## 2017-02-26 MED ORDER — IOPAMIDOL 41 % IT SOLN
15 mL | Freq: Once | INTRATHECAL | 0 refills | Status: CP
Start: 2017-02-26 — End: ?
  Administered 2017-02-26: 18:00:00 15 mL via INTRATHECAL

## 2017-02-26 MED ORDER — DIAZEPAM 5 MG PO TAB
5 mg | Freq: Once | ORAL | 0 refills | Status: CP
Start: 2017-02-26 — End: ?
  Administered 2017-02-26: 16:00:00 5 mg via ORAL

## 2017-02-26 NOTE — H&P (View-Only)
Pre-Procedure History and Physical/Sedation Plan    Procedure Date: 02/26/2017     Planned Procedure(s):  Myelogram     Indication:  Lumbar radiculopathy     Chief Complaint:  Back pain and weakness    History of Present Illness: Victor Graham is a 66 y.o. male with a history significant for back pain and lower extremity weakness who presents today for procedure.    Patient Active Problem List    Diagnosis Date Noted   ??? Abnormal glucose tolerance test 02/03/2016   ??? Positive ANA (antinuclear antibody) 02/03/2016   ??? Lumbar radiculopathy 09/30/2015   ??? 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 Idiopathic polyneuropathy 08/30/2015   ??? Asymptomatic PVCs 03/11/2015   ??? Pulmonary embolism (HCC) 09/17/2014   ??? Spondylosis of lumbar region without myelopathy or radiculopathy 04/22/2014   ??? Bilateral low back pain 04/22/2014   ??? 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 Orchalgia 12/29/2013   ??? Obesity 10/28/2013   ??? Atypical chest pain 11/25/2012   ??? OA (osteoarthritis) of knee 03/01/2010   ??? Cardiomyopathy, idiopathic (HCC) 06/06/2009   ??? Status post thoracic aortic aneurysm repair 07/12/2008   ??? Hypertension 01/08/2008   ??? HLD (hyperlipidemia) 01/08/2008   ??? CAD (coronary artery disease) 01/08/2008     Past Medical History:   Diagnosis Date   ??? Arthritis    ??? Ascending aortic aneurysm (HCC) 01/08/2008   ??? CAD (coronary artery disease)    ??? Dyslipidemia    ??? Erectile dysfunction    ??? Fatty liver    ??? HLD (hyperlipidemia) 01/08/2008   ??? Hypertension 01/08/2008   ??? Idiopathic polyneuropathy    ??? Lumbar radiculopathy    ??? NICM (nonischemic cardiomyopathy) (HCC)    ??? Obesity    ??? Pulmonary embolism [facility]) 2015 -- july 20th    takes xaralto anticoagulant -- treated at [place]      Past Surgical History:   Procedure Laterality Date   ??? FASCIOTOMY Right 1992   ??? SPINAL FUSION  01/2008    for low back pain   ??? HERNIA REPAIR     ??? HX JOINT REPLACEMENT     ??? KNEE SURGERY ??? PR LAPAROSCOPY SURG RPR INITIAL INGUINAL HERNIA        Prescriptions Prior to Admission   Medication Sig Dispense Refill Last Dose   ??? carvedilol (COREG) 25 mg tablet Take 1/2 tablet by mouth in the morning and 1 tablet in the evening with dinner. 135 tablet 3 02/26/2017   ??? hydroxychloroquine (PLAQUENIL) 200 mg tablet Take 1 tablet by mouth twice daily. Take with food. 180 tablet 1 02/26/2017   ??? lisinopril (PRINIVIL; ZESTRIL) 10 mg tablet Take 1 Tab by mouth daily. 90 Tab 3 02/26/2017   ??? meloxicam (MOBIC) 15 mg tablet Take 1 tablet by mouth daily as needed. 90 tablet 2 Past Week   ??? metFORMIN (GLUCOPHAGE) 500 mg tablet Take 500 mg by mouth twice daily.   02/25/2017   ??? pregabalin (LYRICA) 150 mg capsule Take 1 capsule by mouth twice daily. 180 capsule 1 02/26/2017   ??? rivaroxaban (XARELTO) 20 mg tablet Take 1 tablet by mouth daily with dinner. 90 tablet 3 Past Week   ??? SIMVASTATIN (ZOCOR PO) Take 20 mg by mouth at bedtime daily.   02/25/2017   ??? spironolactone (ALDACTONE) 25 mg tablet Take one tablet by mouth daily. Please call to schedule office visit with Dr Iver Nestle 90 tablet 0 02/26/2017   ???  tiZANidine (ZANAFLEX) 2 mg tablet TAKE 1 TABLET TWICE A DAY AS NEEDED 60 tablet 4 Past Week   ??? traMADol (ULTRAM) 50 mg tablet Take 1 tablet by mouth every 8 hours as needed for Pain. 90 tablet 3 Past Week     Allergies   Allergen Reactions   ??? Other [Unclassified Drug] BLISTERS     dissolving stiches in mouth after dental surgery caused blisters   ??? Lipitor [Atorvastatin] FLUSHING (SKIN)   ??? Niaspan [Niacin] FLUSHING (SKIN)   ??? Rubber SEE COMMENTS     Neoprene rubber causes contact dermatitis (CAUSED BY THIOUREA IN NEOPRENE)       Social History:   Social History   Substance Use Topics   ??? Smoking status: Former Smoker     Years: 20.00     Quit date: 08/24/1991   ??? Smokeless tobacco: Never Used   ??? Alcohol use No      Family History   Problem Relation Age of Onset   ??? DVT Mother    ??? Cancer Father    ??? DVT Brother ??? Stroke Paternal Grandfather         Review of Systems  A comprehensive review of systems was negative except for: Constitutional: positive for back pain    Previous Personal Anesthetic/Sedation History:  Denies adverse events related to sedation/anesthesia.     Previous Family Anesthetic/Sedation History: Denies adverse events related to sedation/anesthesia.    Physical Exam:  Vital Signs: Last Filed In 24 Hours Vital Signs: 24 Hour Range         Intensity Pain Scale (Self Report): 6 (02/26/17 0945)      General appearance: alert and no distress noted.  Neurologic: Grossly normal.  Lungs: Non labored.  Heart: regular rate and rhythm      Airway: airway assessment performed  Mallampati II (soft palate, uvula, fauces visible)  Head and Neck: no abnormalities noted  Mouth: no abnormalities noted  NPO status: Acceptable  Pregnancy Status: Not Pregnant  Anesthesia Classification:  ASA III (A patient with a severe systemic disease that limits activity, but is not incapacitating)  Sedation/Medication Plan: Valium and home med tramadol  Discussion/Reviews:  Physician has discussed risks and alternatives of this type of sedation and above planned procedures with patient    Lab/Radiology/Other Diagnostic Tests:  Labs:  Pertinent labs reviewed         Xarelto held x 1 day per protocol    Suzan Slick, APRN  Pager [phone]

## 2017-02-26 NOTE — Other
Immediate Post Procedure Note    Date:  02/26/2017                                         Attending Physician:   Noralee Space, MD  Performing Provider:  Kennith Gain, MD    Consent:  Consent obtained from patient.  Time out performed: Consent obtained, correct patient verified, correct procedure verified, correct site verified, patient marked as necessary.  Pre/Post Procedure Diagnosis:  Lumbar pain  Indications:  Pain    Anesthesia: Local 10 mL 1% lidocaine without epinephrine  Procedure(s):  L spine myelogram  Findings:  Clear aspirate. 83ml Isoview 200M injected.     Estimated Blood Loss:  None/Negligible  Specimen(s) Removed/Disposition:  None  Complications: None  Patient Tolerated Procedure: Well  Post-Procedure Condition:  stable    Kennith Gain, MD  Pager [phone]

## 2017-02-26 NOTE — Progress Notes
Pt to go to pre post for recovery until CT can do post myelogram scan.

## 2017-03-06 ENCOUNTER — Encounter: Admit: 2017-03-06 | Discharge: 2017-03-06 | Payer: MEDICARE

## 2017-03-06 DIAGNOSIS — M545 Low back pain: Principal | ICD-10-CM

## 2017-03-08 DIAGNOSIS — G609 Hereditary and idiopathic neuropathy, unspecified: Secondary | ICD-10-CM

## 2017-03-08 DIAGNOSIS — M545 Low back pain: ICD-10-CM

## 2017-03-08 DIAGNOSIS — G8929 Other chronic pain: ICD-10-CM

## 2017-03-18 NOTE — Progress Notes
Rheumatology Follow Up Visit   Victor Graham is a 66 y.o. 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    BRIEF SUMMARY:   Patient has a history of osteoarthritis s/p bilateral knee arthroplasty, chronic back pain with radiculopathy s/p L4-L5 fusion (2010), spinal cord stimulator (placed 2016), idiopathic peripheral neuropathy that started after back surgery in 2010 (following with Dr. 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     Previously established with his rhuematologist, Dr Juanita Craver (12/2015). Patient then established with Dr. Darrick Meigs in 03/2016, with ongoing pain and swelling of bilateral wrists which had responded to intraarticular steroid injections by his outside orthopedist in [place], Dr. Aundria Rud. 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 Samara Deist who manages his back)    - Last eye exam 12/2016 [facility] (previously 06/2016). Awaiting records.  - Antibodies: ANA > 1280 (homogenous/speckled), hexagonal lupus anticoagulant 14 then 16 on recheck 3 months later. Cardiolipin 21.8(H) on 03/2016, then 12.1 in 06/25/2016.  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  - Plain films 03/30/2016: Bilateral hand and pelvis films with degenerative changes.    INTERVAL HISTORY:   Today the patient reports that his arthritis in his wrists for the most part is well controlled, denies warmth, redness, swelling. Previously his wrists had been the most bothersome and these have not been bothering him since he started the HCQ. He continues to receive steroid injections in his wrists by his outside orthopedic surgeon (one injection in each wrist in the last year) and these continue to help. Denies stiffness or swelling. Currently he is also on Meloxicam and tramadol as needed, prescribed by Dr. Samara Deist for his chronic back pain. Chronic back pain is associated with weakness of his right leg, and this is worse with movement and with certain positions. He follows with Dr. Samara Deist. He has seen neurosurgery and was told that he should not have surgery. He confirms that imaging of his back has     His left shoulder starting hurting when he pulled something off a shelf roughly 2 months ago. Voltaren gel helps. Denies redness, warmth. Pain is with range of motion, namely with abducting his arm above 90 degrees and externally rotating it.     Denies rashes, photosensitivity, Raynaud's, patchy alopecia, inflammatory eye disease, hearing loss.          REVIEW OF SYSTEMS:  Review of Systems   Constitutional: Negative for chills, fatigue and fever.   HENT: Negative for trouble swallowing.    Cardiovascular: Positive for leg swelling (chronic right leg swelling). Negative for chest pain.   Gastrointestinal: Negative for abdominal pain, blood in stool, nausea and vomiting.   Genitourinary: Negative for dysuria.   Musculoskeletal: Positive for back pain.   Skin: Negative for rash.   Neurological: Positive for  weakness (chronic right leg weakness associated with back pain and neuropathy).   Hematological: Negative for adenopathy.       MEDICATIONS:  ??? carvedilol (COREG) 25 mg tablet Take 1/2 tablet by mouth in the morning and 1 tablet in the evening with dinner.   ??? hydroxychloroquine (PLAQUENIL) 200 mg tablet Take one tablet by mouth twice daily. Take with food.   ??? lisinopril (PRINIVIL; ZESTRIL) 10 mg tablet Take 1 Tab by mouth daily.   ??? meloxicam (MOBIC) 15 mg tablet Take 1 tablet by mouth daily as needed.   ??? metFORMIN (GLUCOPHAGE) 500 mg tablet Take 500 mg by mouth twice daily.   ??? pregabalin (LYRICA) 150 mg capsule Take 1 capsule by mouth twice daily.   ??? rivaroxaban (XARELTO) 20 mg tablet Take 1 tablet by mouth daily with dinner.   ??? SIMVASTATIN (ZOCOR PO) Take 20 mg by mouth at bedtime daily.   ??? spironolactone (ALDACTONE) 25 mg tablet Take one tablet by mouth daily. Please call to schedule office visit with Dr Iver Nestle   ??? tiZANidine (ZANAFLEX) 2 mg tablet TAKE 1 TABLET TWICE A DAY AS NEEDED   ??? traMADol (ULTRAM) 50 mg tablet Take 1 tablet by mouth every 8 hours as needed for Pain.          PHYSICAL EXAM:  Vitals:    03/20/17 1354   BP: 132/70   Pulse: 98   Resp: 20   Temp: 36.5 ???C (97.7 ???F)   TempSrc: Oral   SpO2: 94%   Weight: (!) 138.3 kg (305 lb)   Height: 177.8 cm (70)     body mass index is 43.76 kg/m???.     Physical Exam   Constitutional: He is oriented to person, place, and time. He appears well-developed and well-nourished. No distress.   obese Caucasian male in NAD   HENT:   Head: Normocephalic and atraumatic.   Right Ear: External ear normal.   Left Ear: External ear normal.   Nose: Nose normal.   Mouth/Throat: Oropharynx is clear and moist. No oropharyngeal exudate.   Eyes: Conjunctivae and EOM are normal. Pupils are equal, round, and reactive to light. Right eye exhibits no discharge. Left eye exhibits no discharge. No scleral icterus.   Neck: Neck supple. No tracheal deviation present. No thyromegaly present. Cardiovascular: Normal rate, regular rhythm, normal heart sounds and intact distal pulses. Exam reveals no gallop and no friction rub.   No murmur heard.  Pulmonary/Chest: Effort normal and breath sounds normal. No stridor. No respiratory distress. He has no wheezes. He has no rales.   Abdominal: Soft. Bowel sounds are normal. He exhibits no distension. There is no tenderness.   Genitourinary:   Genitourinary Comments: Deferred   Musculoskeletal: He exhibits tenderness (lower back tenderness with palpation). He exhibits no edema or deformity.   - no synovitis, effusions, TTP or decreased ROM on 28-point joint exam, including bilateral wrists.  - tenderness to palpation of right paraspinal mm     Lymphadenopathy:     He has no cervical adenopathy.   Neurological: He is alert and oriented to person, place, and time. He exhibits normal muscle tone. Coordination normal.   Skin: Skin is warm and dry. No rash noted. He is not diaphoretic.   Psychiatric: He has a normal mood and affect. His behavior is normal. Judgment and thought content normal.   Nursing note and vitals reviewed.      LABS:  CBC w/Diff    Lab Results   Component  Value Date/Time    WBC 6.7 03/20/2017 03:40 PM    RBC 5.27 03/20/2017 03:40 PM    HGB 15.6 03/20/2017 03:40 PM    HCT 47.5 03/20/2017 03:40 PM    MCV 90.1 03/20/2017 03:40 PM    MCH 29.6 03/20/2017 03:40 PM    MCHC 32.8 03/20/2017 03:40 PM    RDW 15.2 (H) 03/20/2017 03:40 PM    PLTCT 174 03/20/2017 03:40 PM    MPV 9.1 03/20/2017 03:40 PM    Lab Results   Component Value Date/Time    NEUT 60 03/20/2017 03:40 PM    ANC 4.10 03/20/2017 03:40 PM    LYMA 27 03/20/2017 03:40 PM    ALC 1.80 03/20/2017 03:40 PM    MONA 10 03/20/2017 03:40 PM    AMC 0.60 03/20/2017 03:40 PM    EOSA 2 03/20/2017 03:40 PM    AEC 0.10 03/20/2017 03:40 PM    BASA 1 03/20/2017 03:40 PM    ABC 0.00 03/20/2017 03:40 PM        Comprehensive Metabolic Profile    Lab Results   Component Value Date/Time NA 136 (L) 03/20/2017 03:40 PM    K 4.3 03/20/2017 03:40 PM    CL 100 03/20/2017 03:40 PM    CO2 26 03/20/2017 03:40 PM    GAP 10 03/20/2017 03:40 PM    BUN 29 (H) 03/20/2017 03:40 PM    CR 0.78 03/20/2017 03:40 PM    GLU 83 03/20/2017 03:40 PM    Lab Results   Component Value Date/Time    CA 10.5 03/20/2017 03:40 PM    PO4 3.5 03/30/2016 04:26 PM    ALBUMIN 4.7 03/20/2017 03:40 PM    TOTPROT 7.5 03/20/2017 03:40 PM    ALKPHOS 32 03/20/2017 03:40 PM    AST 35 03/20/2017 03:40 PM    ALT 43 03/20/2017 03:40 PM    TOTBILI 1.4 (H) 03/20/2017 03:40 PM    GFR >60 03/20/2017 03:40 PM    GFRAA >60 03/20/2017 03:40 PM        Discussed patient's BMI with him.  The body mass index is 43.76 kg/m???. and falls within the category of Obesity 3 (>40); encouraged ongoing activity to promote weight loss .      IMAGING: Pertinent Imaging Reviewed    ASSESSMENT:   1. Polyarthropathy    2. Therapeutic drug monitoring    3. Positive ANA (antinuclear antibody)        IMPRESSION: DAYSON SPEIER is a 66 y.o. male who presents today for follow up on polyarthropathy, positive ANA on hydroxychloroquine. The differential for this patient includes CPPD arthropathy vs seronegative inflammatory arthropathy. UCTD remains on differential as well. The patient's arthritis has predominantly affected his wrists and his symptoms (pain, swelling) have remained controlled on hydroxychloroquine. He reports being up to date on his eye exam, however we will need to obtain this record. 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 The patient is on lifelong anticoagulation given history of pulmonary embolism and positive hexagonal lupus anticoagulant. This is managed by his cardiologist. The patient's back issues are degenerative in nature, and are managed by our spine center.    PLAN:   1. Continue hydroxychloroquine. Obtain outside records for eye exam. 2. Lab monitoring as below.   3. Continue anticoagulation as managed by cardiology.  4. Defer to spine center for management of degenerative back issues. Continue  meloxicam and tramadol as per Dr. Samara Deist.  5. RTC 6 months.    Orders Placed This Encounter   ??? URINALYSIS DIPSTICK REFLEX TO CULTURE   ??? URINALYSIS MICROSCOPIC REFLEX TO CULTURE   ??? UA REFLEX CULTURE LABEL   ??? URINE-SPOT PROT/Cr RATIO today   ??? COMPREHENSIVE METABOLIC PANEL   ??? CBC AND DIFF today   ??? SED RATE today   ??? C REACTIVE PROTEIN (CRP)   ??? ANTI-DNA DOUBLE STRAND today   ??? C4 COMPLEMENT 4 today   ??? C3 COMPLEMENT 3  today   ??? hydroxychloroquine (PLAQUENIL) 200 mg tablet     Patient Instructions     Dear Mr. Humm,  It was nice to see you today. Thank you for coming into clinic.    The following was discussed today-  ??? Please continue to use your voltaren gel for your shoulder. If this does not improve, please let us know. This is something that we can manage with physical therapy or injections.   ??? Please continue your hydroxychloroquine at current dose. We will obtain your eye doctor's recent records.   ??? We will need labs today to monitor your disease - we will be in touch with you about your results.  ??? The duration of your hydroxychloroquine treatment is something that we will plan to discuss further at your next visit, in part depending on how you continue to do.   ??? Please continue to stay active and continue efforts at weight loss as your back permits. Please continue to follow with Dr. Samara Deist for your back issues.     The following testing / referrals has been ordered today:  Orders Placed This Encounter   ??? URINALYSIS DIPSTICK REFLEX TO CULTURE   ??? URINALYSIS MICROSCOPIC REFLEX TO CULTURE   ??? UA REFLEX CULTURE LABEL   ??? URINE-SPOT PROT/Cr RATIO today   ??? COMPREHENSIVE METABOLIC PANEL   ??? CBC AND DIFF today   ??? SED RATE today   ??? C REACTIVE PROTEIN (CRP)   ??? ANTI-DNA DOUBLE STRAND today   ??? C4 COMPLEMENT 4 today ??? C3 COMPLEMENT 3  today   ??? hydroxychloroquine (PLAQUENIL) 200 mg tablet       Please make an appointment: Return in about 6 months (around 09/18/2017).  Please don't hesitate to call if you have any problems or questions, we are available at [phone]. Our fax number is [phone].    Sincerely,  Sherren Kerns, MD  Rheumatology Fellow  Division of Allergy,Immunology and Rheumatology  [facility]      Future Appointments   Date Time Provider Department Center   04/24/2017  8:45 AM Jesse Sans, MD [facility]   08/06/2017 10:45 AM Tandy Gaw, DO [facility] SPINE   08/21/2017  8:00 AM Liborio Nixon, MD [facility]   09/11/2017  2:00 PM Zerita Boers, MD [facility]     Visit Disposition     Disposition Check-out Note    Return in about 6 months (around 09/18/2017). with Penni Bombard, 3 months            Patient seen and discussed with Dr. Dora Sims, MD  Rheumatology Fellow, PGY4  [facility]  Division of Rheumatology  [address]  [place], [place] [address]

## 2017-03-19 ENCOUNTER — Encounter: Admit: 2017-03-19 | Discharge: 2017-03-19 | Payer: MEDICARE

## 2017-03-19 ENCOUNTER — Ambulatory Visit: Admit: 2017-03-19 | Discharge: 2017-03-19 | Payer: MEDICARE

## 2017-03-19 DIAGNOSIS — I2510 Atherosclerotic heart disease of native coronary artery without angina pectoris: ICD-10-CM

## 2017-03-19 DIAGNOSIS — M545 Low back pain: Principal | ICD-10-CM

## 2017-03-19 DIAGNOSIS — I10 Essential (primary) hypertension: ICD-10-CM

## 2017-03-19 DIAGNOSIS — K760 Fatty (change of) liver, not elsewhere classified: ICD-10-CM

## 2017-03-19 DIAGNOSIS — E669 Obesity, unspecified: ICD-10-CM

## 2017-03-19 DIAGNOSIS — N529 Male erectile dysfunction, unspecified: ICD-10-CM

## 2017-03-19 DIAGNOSIS — E785 Hyperlipidemia, unspecified: Secondary | ICD-10-CM

## 2017-03-19 DIAGNOSIS — M5136 Other intervertebral disc degeneration, lumbar region: ICD-10-CM

## 2017-03-19 DIAGNOSIS — M5416 Radiculopathy, lumbar region: ICD-10-CM

## 2017-03-19 DIAGNOSIS — M1990 Unspecified osteoarthritis, unspecified site: ICD-10-CM

## 2017-03-19 DIAGNOSIS — I428 Other cardiomyopathies: ICD-10-CM

## 2017-03-19 DIAGNOSIS — I2699 Other pulmonary embolism without acute cor pulmonale: ICD-10-CM

## 2017-03-19 DIAGNOSIS — M48061 Spinal stenosis, lumbar region without neurogenic claudication: ICD-10-CM

## 2017-03-19 DIAGNOSIS — G609 Hereditary and idiopathic neuropathy, unspecified: ICD-10-CM

## 2017-03-19 DIAGNOSIS — I712 Thoracic aortic aneurysm, without rupture: Principal | ICD-10-CM

## 2017-03-19 NOTE — Progress Notes
Victor A. Clydene Pugh, MD [facility]  Spinal Surgery Consultation      CHIEF COMPLAINT     Chief Complaint   Patient presents with   ??? Lower Back - Pain       Subjective   HISTORY OF PRESENT ILLNESS   Victor Graham is a 66 y.o. male.  Patient presents for evaluation of back and bilateral lower extremity pain.  He has pain that radiates down the posterior aspect of both of his legs.  He has 70% back pain and 30% leg pain.  He does have a significant history with his back including a multiple rounds of injections several years ago followed by a lumbar spine fusion performed by Dr. Crisoforo Graham in 2009.  This procedure was performed for back and leg pain.  Both of these symptoms were dramatically improved and he has been quite satisfied with his result from that surgery.  More recently he has had increasing back pain such that he can stand erect and is having difficulty standing and walking for long periods of time.  Pain improves when he sits down however more recently when he lays down and seems to have significant back pain as well.  When he lays on his side either right or left it makes it feel better on his back.    NSAIDS: Yes  PT: Yes  Pain medications: Yes Tramadol  Chiropractic: No  Activity modification: Yes  Injections: Yes  How many?  11 July 2007 - Lumbar spine fusion, Dr. Ricki Graham  September 2016 - Spine stimulator placement        PAST MEDICAL HISTORY     Past Medical History:   Diagnosis Date   ??? Arthritis    ??? Ascending aortic aneurysm (HCC) 01/08/2008   ??? CAD (coronary artery disease)    ??? Dyslipidemia    ??? Erectile dysfunction    ??? Fatty liver    ??? HLD (hyperlipidemia) 01/08/2008   ??? Hypertension 01/08/2008   ??? Idiopathic polyneuropathy    ??? Lumbar radiculopathy    ??? NICM (nonischemic cardiomyopathy) (HCC)    ??? Obesity    ??? Pulmonary embolism [facility]) 2015 -- july 20th    takes xaralto anticoagulant -- treated at [place]       PAST SURGICAL HISTORY     Past Surgical History: Procedure Laterality Date   ??? FASCIOTOMY Right 1992   ??? SPINAL FUSION  01/2008    for low back pain   ??? HERNIA REPAIR     ??? HX JOINT REPLACEMENT     ??? KNEE SURGERY     ??? PR LAPAROSCOPY SURG RPR INITIAL INGUINAL HERNIA         FAMILY HISTORY   family history includes Cancer in his father; DVT in his brother and mother; Stroke in his paternal grandfather.    SOCIAL HISTORY     Social History     Socioeconomic History   ??? Marital status: Married     Spouse name: Not on file   ??? Number of children: Not on file   ??? Years of education: Not on file   ??? Highest education level: Not on file   Social Needs   ??? Financial resource strain: Not on file   ??? Food insecurity - worry: Not on file   ??? Food insecurity - inability: Not on file   ??? Transportation needs - medical: Not on file   ??? Transportation needs - non-medical: Not on file  Occupational History   ??? Not on file   Tobacco Use   ??? Smoking status: Former Smoker     Years: 20.00     Last attempt to quit: 08/24/1991     Years since quitting: 25.5   ??? Smokeless tobacco: Never Used   Substance and Sexual Activity   ??? Alcohol use: No     Alcohol/week: 0.0 oz   ??? Drug use: No   ??? Sexual activity: Not Currently     Partners: Female   Other Topics Concern   ??? 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 Blood Transfusions Not Asked   ??? Caffeine Concern Not Asked   ??? Occupational Exposure Not Asked   ??? Hobby Hazards Not Asked   ??? Sleep Concern Not Asked   ??? Stress Concern Not Asked   ??? Weight Concern Not Asked   ??? Special Diet Not Asked   ??? Back Care Not Asked   ??? Exercise Not Asked   ??? Bike Helmet Not Asked   ??? Seat Graham Not Asked   ??? Self-Exams Not Asked   Social History Narrative   ??? Not on file       ALLERGIES     Allergies   Allergen Reactions   ??? Other [Unclassified Drug] BLISTERS     dissolving stiches in mouth after dental surgery caused blisters   ??? Lipitor [Atorvastatin] FLUSHING (SKIN)   ??? Niaspan [Niacin] FLUSHING (SKIN)   ??? Rubber SEE COMMENTS Neoprene rubber causes contact dermatitis (CAUSED BY THIOUREA IN NEOPRENE)       MEDICATIONS     Current Outpatient Medications:   ???  carvedilol (COREG) 25 mg tablet, Take 1/2 tablet by mouth in the morning and 1 tablet in the evening with dinner., Disp: 135 tablet, Rfl: 3  ???  hydroxychloroquine (PLAQUENIL) 200 mg tablet, Take 1 tablet by mouth twice daily. Take with food., Disp: 180 tablet, Rfl: 1  ???  lisinopril (PRINIVIL; ZESTRIL) 10 mg tablet, Take 1 Tab by mouth daily., Disp: 90 Tab, Rfl: 3  ???  meloxicam (MOBIC) 15 mg tablet, Take 1 tablet by mouth daily as needed., Disp: 90 tablet, Rfl: 2  ???  metFORMIN (GLUCOPHAGE) 500 mg tablet, Take 500 mg by mouth twice daily., Disp: , Rfl:   ???  pregabalin (LYRICA) 150 mg capsule, Take 1 capsule by mouth twice daily., Disp: 180 capsule, Rfl: 1  ???  rivaroxaban (XARELTO) 20 mg tablet, Take 1 tablet by mouth daily with dinner., Disp: 90 tablet, Rfl: 3  ???  SIMVASTATIN (ZOCOR PO), Take 20 mg by mouth at bedtime daily., Disp: , Rfl:   ???  spironolactone (ALDACTONE) 25 mg tablet, Take one tablet by mouth daily. Please call to schedule office visit with Dr Victor Graham, Disp: 90 tablet, Rfl: 0  ???  tiZANidine (ZANAFLEX) 2 mg tablet, TAKE 1 TABLET TWICE A DAY AS NEEDED, Disp: 60 tablet, Rfl: 4  ???  traMADol (ULTRAM) 50 mg tablet, Take 1 tablet by mouth every 8 hours as needed for Pain., Disp: 90 tablet, Rfl: 3    REVIEW OF SYSTEMS   Review of Systems   Constitutional: Positive for activity change.   Genitourinary: Positive for testicular pain.   Musculoskeletal: Positive for arthralgias, back pain and gait problem.   Neurological: Positive for weakness and numbness.   All other systems reviewed and are negative.    A 10- point ROS was performed and negative except for that listed  in the HPI    PHYSICAL EXAM   Blood pressure 124/68, pulse 74, resp. rate 18, height 177.8 cm (70), weight 136.1 kg (300 lb), SpO2 100 %.  Body mass index is 43.05 kg/m???Victor Graham Total Score:: (P) 44 Pain Score: Three    Constitutional: Alert, NAD  Psychiatric: Mood and affect appropriate  Eyes: EOMI  Respiratory: Unlabored respirations  Cardiovascular: Palpable radial and pedal pulses distally.  Skin: No rashes or lesions  Musculoskeletal:  Spine Exam:    Gait: Ambulates with a normal gait. Able to heel and toe walk without difficulty.   Stance: Balanced in the coronal and sagittal planes.     BACK:   Midline scar well healed.    PALPATION:  No pain in the midline or paraspinals. No pain at the SI joint or sciatic notch.    ROM:  Pain with Extension    MOTOR:  Lower Ext. Hip Flex Quads Hamstrings Plantarflex Dorsiflex EHL   Right 5 5 5 5 5 4    Left 5 5 5 5 5 4      SENSATION:  Lower extremity: Sensation intact to light touch in L3-S1 distributions. Decreased sensations in the toes bilaterally - chronic.    REFLEXES:   Patellar Achilles   Right mute mute   Left mute mute     -Negative straight leg raise bilaterally  -Babinski's plantar bilaterally  -No clonus        RADIOGRAPHIC EVALUATION   PA lateral scoliosis radiographs demonstrate multilevel degenerative disc disease throughout the lumbar spine.  There is previous instrumentation and fusion from L4-L5.  This appears to be well-healed.  This appears to be old instrumentation.  He has severe disc collapse at L3-4 and L2-3.  He is flattening of the lumbar spine and the proximal aspect.  He does appear to be compensating through his pelvis however I am not measured all of his pelvic parameters.    CT myelogram is also reviewed.  These demonstrate severe stenosis at L3-4.  This is the adjacent segment just proximal to his previous fusion.  There is vacuum signs in the L3 for L2-3 and L1-2 disc spaces.  There is severe degeneration of the disks at L2-3 and L3-4.     ASSESSMENT / PLAN     1. DDD (degenerative disc disease), lumbar     2. H/O spinal fusion     3. Spinal stenosis of lumbar region with radiculopathy 4. Chronic midline low back pain with sciatica, sciatica laterality unspecified       I talked with him at length about treatment options.  I think that if he has 70% back and 30% leg pain he certainly would be a candidate for a fusion procedure.  He has significant disc degeneration as well as stenosis in his lumbar spine.  This stenosis is at 3 4 primarily but there is a small degree of stenosis at L2-3.  I think given these levels as well as the disc degeneration at these levels I would recommend most likely an L2-5 fusion.  With decompression at L3-4 and L2-3.  Given the vacuum signs he would likely be a good candidate for lateral interbody fusions.  We would take care to measure his pelvic parameters to ensure that we restore his sagittal alignment.    Currently his BMI is 43.  I discussed with him the risk factors involved with being obese.  I think that he would need to lose weight before I consider surgical  operation on him.  He is willing to try this.  He will come back and see [place] after his BMI is down below 40.  I gave him a target goal of 225-250 pound weight I think that this would dramatically impact his surgical outcomes.       [place] B. Lisette Grinder, MD, MPH  Spinal Surgery  [place]. Clydene Pugh, MD [facility]  Nurse: Laverle Patter, BSN, RN, CNOR   [phone]  -  [email or web address]

## 2017-03-20 ENCOUNTER — Ambulatory Visit: Admit: 2017-03-20 | Discharge: 2017-03-20 | Payer: MEDICARE

## 2017-03-20 ENCOUNTER — Encounter: Admit: 2017-03-20 | Discharge: 2017-03-20 | Payer: MEDICARE

## 2017-03-20 DIAGNOSIS — R768 Other specified abnormal immunological findings in serum: ICD-10-CM

## 2017-03-20 DIAGNOSIS — I428 Other cardiomyopathies: ICD-10-CM

## 2017-03-20 DIAGNOSIS — I2510 Atherosclerotic heart disease of native coronary artery without angina pectoris: ICD-10-CM

## 2017-03-20 DIAGNOSIS — I2699 Other pulmonary embolism without acute cor pulmonale: ICD-10-CM

## 2017-03-20 DIAGNOSIS — R7989 Other specified abnormal findings of blood chemistry: ICD-10-CM

## 2017-03-20 DIAGNOSIS — K760 Fatty (change of) liver, not elsewhere classified: ICD-10-CM

## 2017-03-20 DIAGNOSIS — E669 Obesity, unspecified: ICD-10-CM

## 2017-03-20 DIAGNOSIS — I712 Thoracic aortic aneurysm, without rupture: Principal | ICD-10-CM

## 2017-03-20 DIAGNOSIS — Z5181 Encounter for therapeutic drug level monitoring: ICD-10-CM

## 2017-03-20 DIAGNOSIS — M1990 Unspecified osteoarthritis, unspecified site: ICD-10-CM

## 2017-03-20 DIAGNOSIS — G609 Hereditary and idiopathic neuropathy, unspecified: ICD-10-CM

## 2017-03-20 DIAGNOSIS — E785 Hyperlipidemia, unspecified: Secondary | ICD-10-CM

## 2017-03-20 DIAGNOSIS — M130 Polyarthritis, unspecified: Principal | ICD-10-CM

## 2017-03-20 DIAGNOSIS — M5416 Radiculopathy, lumbar region: ICD-10-CM

## 2017-03-20 DIAGNOSIS — N529 Male erectile dysfunction, unspecified: ICD-10-CM

## 2017-03-20 DIAGNOSIS — I10 Essential (primary) hypertension: ICD-10-CM

## 2017-03-20 LAB — URINALYSIS MICROSCOPIC REFLEX TO CULTURE

## 2017-03-20 LAB — URINALYSIS DIPSTICK REFLEX TO CULTURE
Lab: 1 % (ref 1.003–1.035)
Lab: 6 % (ref 5.0–8.0)
Lab: NEGATIVE
Lab: NEGATIVE % (ref 0–2)
Lab: NEGATIVE % (ref 0–5)
Lab: NEGATIVE % (ref 4–12)
Lab: NEGATIVE 10*3/uL (ref 0–0.45)
Lab: NEGATIVE 10*3/uL (ref 1.0–4.8)
Lab: NEGATIVE 10*3/uL (ref 1.8–7.0)
Lab: NEGATIVE K/UL (ref 0–0.20)

## 2017-03-20 LAB — SED RATE: Lab: 10 mm/h — ABNORMAL HIGH (ref 0–20)

## 2017-03-20 LAB — C4 COMPLEMENT 4: Lab: 41 mg/dL — ABNORMAL HIGH (ref 10–49)

## 2017-03-20 LAB — COMPREHENSIVE METABOLIC PANEL
Lab: 0.7 mg/dL (ref 0.4–1.24)
Lab: 136 MMOL/L — ABNORMAL LOW (ref 137–147)
Lab: 4.3 MMOL/L (ref 3.5–5.1)
Lab: 4.7 g/dL (ref 3.5–5.0)
Lab: 7.5 g/dL (ref 6.0–8.0)
Lab: 83 mg/dL (ref 70–100)

## 2017-03-20 LAB — CBC AND DIFF
Lab: 29 pg (ref >60)
Lab: 5.2 M/UL (ref 4.4–5.5)
Lab: 6.7 K/UL (ref 4.5–11.0)

## 2017-03-20 LAB — PROTEIN/CR RATIO,UR RAN
Lab: 0.1 FL (ref 80–100)
Lab: 5 mg/dL (ref 13.5–16.5)
Lab: 79 mg/dL (ref 40–50)

## 2017-03-20 LAB — C3 COMPLEMENT 3: Lab: 131 mg/dL (ref 88–200)

## 2017-03-20 MED ORDER — HYDROXYCHLOROQUINE 200 MG PO TAB
200 mg | ORAL_TABLET | Freq: Two times a day (BID) | ORAL | 1 refills | 90.00000 days | Status: AC
Start: 2017-03-20 — End: 2017-10-23

## 2017-03-21 ENCOUNTER — Encounter: Admit: 2017-03-21 | Discharge: 2017-03-21 | Payer: MEDICARE

## 2017-03-21 LAB — ANTI-DNA DOUBLE STRAND: Lab: <10 {titer} (ref <10)

## 2017-03-21 NOTE — Telephone Encounter
Pt lvm inquiring about standing labs as he stated that he was not aware that he needed these drawn monthly.     Called pt and informed him that I would return call with recommendations. Pt stated understanding.     Routing to Dr. Delilah Shan for recommendations.

## 2017-03-21 NOTE — Progress Notes
ATTESTATION    I personally performed the key portions of the E/M visit, discussed the case with the Rheumatology fellow, Dr. Kyra Searles, and concur with her documentation of history, physical exam, assessment, and treatment plan unless otherwise noted.    Victor Graham returns for follow-up of inflammatory arthritis.  Laboratory testing has included a positive ANA.  Wrist involvement has been prominent and x-rays demonstrated evidence of SLAC arthropathy.  The differential in the past has thought to include rheumatoid arthritis as well as CPPD arthropathy.  Victor Graham is unclear how much change symptomatically there has been with hydroxychloroquine.  At this point, elected to continue hydroxychloroquine with ongoing monitoring of inflammatory arthritis symptoms.    Staff Name: Allyson Sabal, MD

## 2017-03-22 ENCOUNTER — Encounter: Admit: 2017-03-22 | Discharge: 2017-03-22 | Payer: MEDICARE

## 2017-03-22 DIAGNOSIS — G609 Hereditary and idiopathic neuropathy, unspecified: ICD-10-CM

## 2017-03-22 DIAGNOSIS — I712 Thoracic aortic aneurysm, without rupture: Principal | ICD-10-CM

## 2017-03-22 DIAGNOSIS — I10 Essential (primary) hypertension: ICD-10-CM

## 2017-03-22 DIAGNOSIS — I2699 Other pulmonary embolism without acute cor pulmonale: ICD-10-CM

## 2017-03-22 DIAGNOSIS — I428 Other cardiomyopathies: ICD-10-CM

## 2017-03-22 DIAGNOSIS — E669 Obesity, unspecified: ICD-10-CM

## 2017-03-22 DIAGNOSIS — E785 Hyperlipidemia, unspecified: ICD-10-CM

## 2017-03-22 DIAGNOSIS — M1990 Unspecified osteoarthritis, unspecified site: ICD-10-CM

## 2017-03-22 DIAGNOSIS — K760 Fatty (change of) liver, not elsewhere classified: ICD-10-CM

## 2017-03-22 DIAGNOSIS — N529 Male erectile dysfunction, unspecified: ICD-10-CM

## 2017-03-22 DIAGNOSIS — M5416 Radiculopathy, lumbar region: ICD-10-CM

## 2017-03-22 DIAGNOSIS — I2510 Atherosclerotic heart disease of native coronary artery without angina pectoris: ICD-10-CM

## 2017-03-22 NOTE — Telephone Encounter
The patient does not need monthly labs.   He does need to repeat his BMP in 2 weeks.     I have sent patient a mychart message notifying him of what he needs to do from a lab standpoint.

## 2017-03-22 NOTE — Progress Notes
Labs reviewed.   Results released to MyChart    Mild bump in creatinine. No protein/cells/casts in urine. Other labs stable at his baseline.   Will repeat labs in 2 weeks. If creatinine remains elevated will plan to stop meloxicam (prescribed by spine center).    MyChart to patient as follows:  Dear Mr. Housand,   Thank you for coming in last Wednesday. Your labs are all stable at your baseline with the exception of your kidney function. Your kidney function has decreased slightly since your last set of labs. We would like to recheck this in 2 weeks, and you can come in to get these labs done at any point during that time. Please avoid dehydration (you do not need to fast). Do not take any additional pain medications (like ibuprofen, naproxen, etc) other than your meloxicam.     Otherwise, you do not need any repeat labs from a rheumatologic standpoint.     Thank you,   Kyra Searles, MD

## 2017-03-26 ENCOUNTER — Encounter: Admit: 2017-03-26 | Discharge: 2017-03-26 | Payer: MEDICARE

## 2017-04-15 ENCOUNTER — Encounter: Admit: 2017-04-15 | Discharge: 2017-04-15 | Payer: MEDICARE

## 2017-04-15 ENCOUNTER — Ambulatory Visit: Admit: 2017-04-15 | Discharge: 2017-04-16 | Payer: MEDICARE

## 2017-04-15 DIAGNOSIS — E785 Hyperlipidemia, unspecified: Secondary | ICD-10-CM

## 2017-04-15 DIAGNOSIS — M5416 Radiculopathy, lumbar region: ICD-10-CM

## 2017-04-15 DIAGNOSIS — I2699 Other pulmonary embolism without acute cor pulmonale: ICD-10-CM

## 2017-04-15 DIAGNOSIS — N529 Male erectile dysfunction, unspecified: ICD-10-CM

## 2017-04-15 DIAGNOSIS — I712 Thoracic aortic aneurysm, without rupture: Principal | ICD-10-CM

## 2017-04-15 DIAGNOSIS — E669 Obesity, unspecified: ICD-10-CM

## 2017-04-15 DIAGNOSIS — I428 Other cardiomyopathies: ICD-10-CM

## 2017-04-15 DIAGNOSIS — M1990 Unspecified osteoarthritis, unspecified site: ICD-10-CM

## 2017-04-15 DIAGNOSIS — I2510 Atherosclerotic heart disease of native coronary artery without angina pectoris: ICD-10-CM

## 2017-04-15 DIAGNOSIS — G609 Hereditary and idiopathic neuropathy, unspecified: ICD-10-CM

## 2017-04-15 DIAGNOSIS — K760 Fatty (change of) liver, not elsewhere classified: ICD-10-CM

## 2017-04-15 DIAGNOSIS — I10 Essential (primary) hypertension: ICD-10-CM

## 2017-04-15 MED ORDER — HYDROCODONE-ACETAMINOPHEN 5-325 MG PO TAB
1 | ORAL_TABLET | Freq: Two times a day (BID) | ORAL | 0 refills | 15.00000 days | Status: AC | PRN
Start: 2017-04-15 — End: 2017-04-30

## 2017-04-16 DIAGNOSIS — M5137 Other intervertebral disc degeneration, lumbosacral region: Principal | ICD-10-CM

## 2017-04-18 ENCOUNTER — Encounter: Admit: 2017-04-18 | Discharge: 2017-04-18 | Payer: MEDICARE

## 2017-04-18 ENCOUNTER — Ambulatory Visit: Admit: 2017-04-18 | Discharge: 2017-04-19 | Payer: MEDICARE

## 2017-04-18 DIAGNOSIS — I2699 Other pulmonary embolism without acute cor pulmonale: ICD-10-CM

## 2017-04-18 DIAGNOSIS — I712 Thoracic aortic aneurysm, without rupture: Principal | ICD-10-CM

## 2017-04-18 DIAGNOSIS — G609 Hereditary and idiopathic neuropathy, unspecified: ICD-10-CM

## 2017-04-18 DIAGNOSIS — I10 Essential (primary) hypertension: ICD-10-CM

## 2017-04-18 DIAGNOSIS — M1990 Unspecified osteoarthritis, unspecified site: ICD-10-CM

## 2017-04-18 DIAGNOSIS — E669 Obesity, unspecified: ICD-10-CM

## 2017-04-18 DIAGNOSIS — I428 Other cardiomyopathies: ICD-10-CM

## 2017-04-18 DIAGNOSIS — I2510 Atherosclerotic heart disease of native coronary artery without angina pectoris: ICD-10-CM

## 2017-04-18 DIAGNOSIS — M5416 Radiculopathy, lumbar region: Principal | ICD-10-CM

## 2017-04-18 DIAGNOSIS — E785 Hyperlipidemia, unspecified: Secondary | ICD-10-CM

## 2017-04-18 DIAGNOSIS — N529 Male erectile dysfunction, unspecified: ICD-10-CM

## 2017-04-18 DIAGNOSIS — K760 Fatty (change of) liver, not elsewhere classified: ICD-10-CM

## 2017-04-18 MED ORDER — IOPAMIDOL 41 % IT SOLN
2.5 mL | Freq: Once | EPIDURAL | 0 refills | Status: CP
Start: 2017-04-18 — End: ?
  Administered 2017-04-18: 17:00:00 2.5 mL via EPIDURAL

## 2017-04-18 MED ORDER — TRIAMCINOLONE ACETONIDE 40 MG/ML IJ SUSP
80 mg | Freq: Once | EPIDURAL | 0 refills | Status: CP
Start: 2017-04-18 — End: ?
  Administered 2017-04-18: 17:00:00 80 mg via EPIDURAL

## 2017-04-19 DIAGNOSIS — M5137 Other intervertebral disc degeneration, lumbosacral region: Principal | ICD-10-CM

## 2017-04-19 DIAGNOSIS — M5416 Radiculopathy, lumbar region: Secondary | ICD-10-CM

## 2017-04-19 DIAGNOSIS — I10 Essential (primary) hypertension: ICD-10-CM

## 2017-04-19 DIAGNOSIS — E785 Hyperlipidemia, unspecified: ICD-10-CM

## 2017-04-19 DIAGNOSIS — I2510 Atherosclerotic heart disease of native coronary artery without angina pectoris: ICD-10-CM

## 2017-04-19 DIAGNOSIS — Z87891 Personal history of nicotine dependence: ICD-10-CM

## 2017-04-20 ENCOUNTER — Encounter: Admit: 2017-04-20 | Discharge: 2017-04-20 | Payer: MEDICARE

## 2017-04-23 ENCOUNTER — Encounter: Admit: 2017-04-23 | Discharge: 2017-04-23 | Payer: MEDICARE

## 2017-04-23 MED ORDER — TIZANIDINE 2 MG PO TAB
ORAL_TABLET | Freq: Two times a day (BID) | 4 refills | Status: AC | PRN
Start: 2017-04-23 — End: 2017-10-16

## 2017-04-24 ENCOUNTER — Encounter: Admit: 2017-04-24 | Discharge: 2017-04-24 | Payer: MEDICARE

## 2017-04-24 ENCOUNTER — Ambulatory Visit: Admit: 2017-04-24 | Discharge: 2017-04-24 | Payer: MEDICARE

## 2017-04-24 DIAGNOSIS — N529 Male erectile dysfunction, unspecified: ICD-10-CM

## 2017-04-24 DIAGNOSIS — I2510 Atherosclerotic heart disease of native coronary artery without angina pectoris: Principal | ICD-10-CM

## 2017-04-24 DIAGNOSIS — M1990 Unspecified osteoarthritis, unspecified site: ICD-10-CM

## 2017-04-24 DIAGNOSIS — E6609 Other obesity due to excess calories: ICD-10-CM

## 2017-04-24 DIAGNOSIS — Z8249 Family history of ischemic heart disease and other diseases of the circulatory system: ICD-10-CM

## 2017-04-24 DIAGNOSIS — I428 Other cardiomyopathies: ICD-10-CM

## 2017-04-24 DIAGNOSIS — E785 Hyperlipidemia, unspecified: Secondary | ICD-10-CM

## 2017-04-24 DIAGNOSIS — M5416 Radiculopathy, lumbar region: ICD-10-CM

## 2017-04-24 DIAGNOSIS — I10 Essential (primary) hypertension: ICD-10-CM

## 2017-04-24 DIAGNOSIS — F17200 Nicotine dependence, unspecified, uncomplicated: ICD-10-CM

## 2017-04-24 DIAGNOSIS — I712 Thoracic aortic aneurysm, without rupture: Principal | ICD-10-CM

## 2017-04-24 DIAGNOSIS — I351 Nonrheumatic aortic (valve) insufficiency: ICD-10-CM

## 2017-04-24 DIAGNOSIS — E669 Obesity, unspecified: ICD-10-CM

## 2017-04-24 DIAGNOSIS — K760 Fatty (change of) liver, not elsewhere classified: ICD-10-CM

## 2017-04-24 DIAGNOSIS — I493 Ventricular premature depolarization: ICD-10-CM

## 2017-04-24 DIAGNOSIS — I2699 Other pulmonary embolism without acute cor pulmonale: ICD-10-CM

## 2017-04-24 DIAGNOSIS — R0789 Other chest pain: ICD-10-CM

## 2017-04-24 DIAGNOSIS — G609 Hereditary and idiopathic neuropathy, unspecified: ICD-10-CM

## 2017-04-24 MED ORDER — CARVEDILOL 12.5 MG PO TAB
12.5 mg | ORAL_TABLET | Freq: Two times a day (BID) | ORAL | 3 refills | 90.00000 days | Status: AC
Start: 2017-04-24 — End: 2017-10-21

## 2017-04-25 ENCOUNTER — Encounter: Admit: 2017-04-25 | Discharge: 2017-04-25 | Payer: MEDICARE

## 2017-04-25 DIAGNOSIS — G609 Hereditary and idiopathic neuropathy, unspecified: Principal | ICD-10-CM

## 2017-04-25 DIAGNOSIS — M5416 Radiculopathy, lumbar region: ICD-10-CM

## 2017-04-25 MED ORDER — PREGABALIN 150 MG PO CAP
150 mg | ORAL_CAPSULE | Freq: Two times a day (BID) | ORAL | 1 refills | Status: AC
Start: 2017-04-25 — End: 2017-05-01

## 2017-04-30 ENCOUNTER — Encounter: Admit: 2017-04-30 | Discharge: 2017-04-30 | Payer: MEDICARE

## 2017-04-30 MED ORDER — HYDROCODONE-ACETAMINOPHEN 5-325 MG PO TAB
1 | ORAL_TABLET | Freq: Two times a day (BID) | ORAL | 0 refills | 15.00000 days | Status: AC | PRN
Start: 2017-04-30 — End: 2017-05-01

## 2017-05-01 ENCOUNTER — Encounter: Admit: 2017-05-01 | Discharge: 2017-05-01 | Payer: MEDICARE

## 2017-05-01 DIAGNOSIS — M5416 Radiculopathy, lumbar region: ICD-10-CM

## 2017-05-01 DIAGNOSIS — G609 Hereditary and idiopathic neuropathy, unspecified: Principal | ICD-10-CM

## 2017-05-01 MED ORDER — HYDROCODONE-ACETAMINOPHEN 5-325 MG PO TAB
1 | ORAL_TABLET | Freq: Two times a day (BID) | ORAL | 0 refills | 30.00000 days | Status: AC | PRN
Start: 2017-05-01 — End: 2017-07-18

## 2017-05-01 MED ORDER — PREGABALIN 150 MG PO CAP
150 mg | ORAL_CAPSULE | Freq: Two times a day (BID) | ORAL | 1 refills | Status: AC
Start: 2017-05-01 — End: 2017-08-08

## 2017-05-06 ENCOUNTER — Encounter: Admit: 2017-05-06 | Discharge: 2017-05-06 | Payer: MEDICARE

## 2017-05-06 DIAGNOSIS — I428 Other cardiomyopathies: Principal | ICD-10-CM

## 2017-05-06 MED ORDER — SPIRONOLACTONE 25 MG PO TAB
25 mg | ORAL_TABLET | Freq: Every day | ORAL | 3 refills | 90.00000 days | Status: AC
Start: 2017-05-06 — End: 2018-05-06

## 2017-05-10 ENCOUNTER — Encounter: Admit: 2017-05-10 | Discharge: 2017-05-10 | Payer: MEDICARE

## 2017-05-24 ENCOUNTER — Encounter: Admit: 2017-05-24 | Discharge: 2017-05-24 | Payer: MEDICARE

## 2017-06-06 ENCOUNTER — Encounter: Admit: 2017-06-06 | Discharge: 2017-06-06 | Payer: MEDICARE

## 2017-06-07 MED ORDER — TRAMADOL 50 MG PO TAB
50 mg | ORAL_TABLET | ORAL | 3 refills | Status: AC | PRN
Start: 2017-06-07 — End: 2017-07-18

## 2017-06-07 MED ORDER — MELOXICAM 15 MG PO TAB
15 mg | ORAL_TABLET | Freq: Every day | ORAL | 2 refills | 30.00000 days | Status: AC | PRN
Start: 2017-06-07 — End: 2017-06-11

## 2017-06-11 ENCOUNTER — Encounter: Admit: 2017-06-11 | Discharge: 2017-06-11 | Payer: MEDICARE

## 2017-06-11 MED ORDER — MELOXICAM 15 MG PO TAB
15 mg | ORAL_TABLET | Freq: Every day | ORAL | 2 refills | 30.00000 days | Status: AC | PRN
Start: 2017-06-11 — End: 2018-07-03

## 2017-07-18 ENCOUNTER — Encounter: Admit: 2017-07-18 | Discharge: 2017-07-18 | Payer: MEDICARE

## 2017-07-18 ENCOUNTER — Ambulatory Visit: Admit: 2017-07-18 | Discharge: 2017-07-19 | Payer: MEDICARE

## 2017-07-18 DIAGNOSIS — G609 Hereditary and idiopathic neuropathy, unspecified: ICD-10-CM

## 2017-07-18 DIAGNOSIS — I428 Other cardiomyopathies: ICD-10-CM

## 2017-07-18 DIAGNOSIS — M5416 Radiculopathy, lumbar region: ICD-10-CM

## 2017-07-18 DIAGNOSIS — I712 Thoracic aortic aneurysm, without rupture: Principal | ICD-10-CM

## 2017-07-18 DIAGNOSIS — M5137 Other intervertebral disc degeneration, lumbosacral region: Principal | ICD-10-CM

## 2017-07-18 DIAGNOSIS — K760 Fatty (change of) liver, not elsewhere classified: ICD-10-CM

## 2017-07-18 DIAGNOSIS — N529 Male erectile dysfunction, unspecified: ICD-10-CM

## 2017-07-18 DIAGNOSIS — E669 Obesity, unspecified: ICD-10-CM

## 2017-07-18 DIAGNOSIS — I2510 Atherosclerotic heart disease of native coronary artery without angina pectoris: ICD-10-CM

## 2017-07-18 DIAGNOSIS — I10 Essential (primary) hypertension: ICD-10-CM

## 2017-07-18 DIAGNOSIS — I2699 Other pulmonary embolism without acute cor pulmonale: ICD-10-CM

## 2017-07-18 DIAGNOSIS — E785 Hyperlipidemia, unspecified: ICD-10-CM

## 2017-07-18 DIAGNOSIS — M1990 Unspecified osteoarthritis, unspecified site: ICD-10-CM

## 2017-07-18 MED ORDER — TRAMADOL 50 MG PO TAB
50 mg | ORAL_TABLET | ORAL | 3 refills | Status: AC | PRN
Start: 2017-07-18 — End: 2017-10-16

## 2017-08-08 ENCOUNTER — Ambulatory Visit: Admit: 2017-08-08 | Discharge: 2017-08-09 | Payer: MEDICARE

## 2017-08-08 ENCOUNTER — Encounter: Admit: 2017-08-08 | Discharge: 2017-08-08 | Payer: MEDICARE

## 2017-08-08 DIAGNOSIS — E785 Hyperlipidemia, unspecified: Secondary | ICD-10-CM

## 2017-08-08 DIAGNOSIS — I428 Other cardiomyopathies: ICD-10-CM

## 2017-08-08 DIAGNOSIS — I712 Thoracic aortic aneurysm, without rupture: Principal | ICD-10-CM

## 2017-08-08 DIAGNOSIS — M5416 Radiculopathy, lumbar region: ICD-10-CM

## 2017-08-08 DIAGNOSIS — M1990 Unspecified osteoarthritis, unspecified site: ICD-10-CM

## 2017-08-08 DIAGNOSIS — I2510 Atherosclerotic heart disease of native coronary artery without angina pectoris: ICD-10-CM

## 2017-08-08 DIAGNOSIS — G609 Hereditary and idiopathic neuropathy, unspecified: ICD-10-CM

## 2017-08-08 DIAGNOSIS — N529 Male erectile dysfunction, unspecified: ICD-10-CM

## 2017-08-08 DIAGNOSIS — I10 Essential (primary) hypertension: ICD-10-CM

## 2017-08-08 DIAGNOSIS — E669 Obesity, unspecified: ICD-10-CM

## 2017-08-08 DIAGNOSIS — K760 Fatty (change of) liver, not elsewhere classified: ICD-10-CM

## 2017-08-08 DIAGNOSIS — I2699 Other pulmonary embolism without acute cor pulmonale: ICD-10-CM

## 2017-08-08 MED ORDER — PREGABALIN 200 MG PO CAP
200 mg | ORAL_CAPSULE | Freq: Two times a day (BID) | ORAL | 0 refills | Status: AC
Start: 2017-08-08 — End: 2017-08-08

## 2017-08-08 MED ORDER — PREGABALIN 200 MG PO CAP
200 mg | ORAL_CAPSULE | Freq: Two times a day (BID) | ORAL | 0 refills | Status: AC
Start: 2017-08-08 — End: 2017-10-23

## 2017-08-09 DIAGNOSIS — G609 Hereditary and idiopathic neuropathy, unspecified: Principal | ICD-10-CM

## 2017-08-09 DIAGNOSIS — M5416 Radiculopathy, lumbar region: ICD-10-CM

## 2017-08-13 ENCOUNTER — Encounter: Admit: 2017-08-13 | Discharge: 2017-08-13 | Payer: MEDICARE

## 2017-08-16 ENCOUNTER — Encounter: Admit: 2017-08-16 | Discharge: 2017-08-16 | Payer: MEDICARE

## 2017-08-19 ENCOUNTER — Ambulatory Visit: Admit: 2017-08-19 | Discharge: 2017-08-19 | Payer: MEDICARE

## 2017-08-19 ENCOUNTER — Ambulatory Visit: Admit: 2017-08-19 | Discharge: 2017-08-20 | Payer: MEDICARE

## 2017-08-19 ENCOUNTER — Encounter: Admit: 2017-08-19 | Discharge: 2017-08-19 | Payer: MEDICARE

## 2017-08-19 DIAGNOSIS — M1990 Unspecified osteoarthritis, unspecified site: ICD-10-CM

## 2017-08-19 DIAGNOSIS — I2699 Other pulmonary embolism without acute cor pulmonale: ICD-10-CM

## 2017-08-19 DIAGNOSIS — R768 Other specified abnormal immunological findings in serum: ICD-10-CM

## 2017-08-19 DIAGNOSIS — I2510 Atherosclerotic heart disease of native coronary artery without angina pectoris: ICD-10-CM

## 2017-08-19 DIAGNOSIS — I10 Essential (primary) hypertension: ICD-10-CM

## 2017-08-19 DIAGNOSIS — Z0000 Encounter for general adult medical examination without abnormal findings: ICD-10-CM

## 2017-08-19 DIAGNOSIS — E785 Hyperlipidemia, unspecified: ICD-10-CM

## 2017-08-19 DIAGNOSIS — K760 Fatty (change of) liver, not elsewhere classified: ICD-10-CM

## 2017-08-19 DIAGNOSIS — N401 Enlarged prostate with lower urinary tract symptoms: ICD-10-CM

## 2017-08-19 DIAGNOSIS — G609 Hereditary and idiopathic neuropathy, unspecified: ICD-10-CM

## 2017-08-19 DIAGNOSIS — I493 Ventricular premature depolarization: ICD-10-CM

## 2017-08-19 DIAGNOSIS — M130 Polyarthritis, unspecified: ICD-10-CM

## 2017-08-19 DIAGNOSIS — Z9889 Other specified postprocedural states: ICD-10-CM

## 2017-08-19 DIAGNOSIS — I428 Other cardiomyopathies: ICD-10-CM

## 2017-08-19 DIAGNOSIS — M5416 Radiculopathy, lumbar region: ICD-10-CM

## 2017-08-19 DIAGNOSIS — N529 Male erectile dysfunction, unspecified: ICD-10-CM

## 2017-08-19 DIAGNOSIS — Z5181 Encounter for therapeutic drug level monitoring: ICD-10-CM

## 2017-08-19 DIAGNOSIS — I351 Nonrheumatic aortic (valve) insufficiency: ICD-10-CM

## 2017-08-19 DIAGNOSIS — N50819 Testicular pain, unspecified: ICD-10-CM

## 2017-08-19 DIAGNOSIS — I712 Thoracic aortic aneurysm, without rupture: Principal | ICD-10-CM

## 2017-08-19 DIAGNOSIS — E669 Obesity, unspecified: ICD-10-CM

## 2017-08-19 LAB — COMPREHENSIVE METABOLIC PANEL
Lab: 0.7 mg/dL (ref 0.4–1.24)
Lab: 104 MMOL/L (ref 98–110)
Lab: 136 MMOL/L — ABNORMAL LOW (ref 137–147)
Lab: 4.6 MMOL/L (ref 3.5–5.1)
Lab: 7.3 g/dL (ref 6.0–8.0)
Lab: 8 K/UL (ref 3–12)
Lab: 9.9 mg/dL (ref 8.5–10.6)
Lab: 99 mg/dL (ref 70–100)
Lab: >60 mL/min (ref >60)
Lab: >60 mL/min (ref >60)

## 2017-08-19 LAB — LIPID PROFILE
Lab: 100 mg/dL (ref 7–56)
Lab: 144 mg/dL — ABNORMAL HIGH (ref <200)
Lab: 18 mg/dL (ref 21–30)
Lab: 44 mg/dL (ref >40)
Lab: 91 mg/dL (ref <150)
Lab: 94 mg/dL (ref <100)

## 2017-08-19 LAB — PROSTATIC SPECIFIC ANTIGEN-PSA: Lab: 1.2 ng/mL — ABNORMAL HIGH (ref <4.01)

## 2017-08-19 LAB — CBC AND DIFF
Lab: 15 g/dL (ref 13.5–16.5)
Lab: 5.3 M/UL (ref 4.4–5.5)
Lab: 6.9 10*3/uL (ref 4.5–11.0)

## 2017-08-21 ENCOUNTER — Encounter: Admit: 2017-08-21 | Discharge: 2017-08-21 | Payer: MEDICARE

## 2017-08-21 ENCOUNTER — Ambulatory Visit: Admit: 2017-08-21 | Discharge: 2017-08-21 | Payer: MEDICARE

## 2017-08-21 DIAGNOSIS — E785 Hyperlipidemia, unspecified: Secondary | ICD-10-CM

## 2017-08-21 DIAGNOSIS — N390 Urinary tract infection, site not specified: Principal | ICD-10-CM

## 2017-08-21 DIAGNOSIS — I2699 Other pulmonary embolism without acute cor pulmonale: ICD-10-CM

## 2017-08-21 DIAGNOSIS — N50819 Testicular pain, unspecified: ICD-10-CM

## 2017-08-21 DIAGNOSIS — K760 Fatty (change of) liver, not elsewhere classified: ICD-10-CM

## 2017-08-21 DIAGNOSIS — I2510 Atherosclerotic heart disease of native coronary artery without angina pectoris: ICD-10-CM

## 2017-08-21 DIAGNOSIS — I428 Other cardiomyopathies: ICD-10-CM

## 2017-08-21 DIAGNOSIS — I10 Essential (primary) hypertension: ICD-10-CM

## 2017-08-21 DIAGNOSIS — I351 Nonrheumatic aortic (valve) insufficiency: ICD-10-CM

## 2017-08-21 DIAGNOSIS — E669 Obesity, unspecified: ICD-10-CM

## 2017-08-21 DIAGNOSIS — I712 Thoracic aortic aneurysm, without rupture: Principal | ICD-10-CM

## 2017-08-21 DIAGNOSIS — I493 Ventricular premature depolarization: ICD-10-CM

## 2017-08-21 DIAGNOSIS — N529 Male erectile dysfunction, unspecified: ICD-10-CM

## 2017-08-21 DIAGNOSIS — G609 Hereditary and idiopathic neuropathy, unspecified: ICD-10-CM

## 2017-08-21 DIAGNOSIS — M5416 Radiculopathy, lumbar region: ICD-10-CM

## 2017-08-21 DIAGNOSIS — R768 Other specified abnormal immunological findings in serum: ICD-10-CM

## 2017-08-21 DIAGNOSIS — M1990 Unspecified osteoarthritis, unspecified site: ICD-10-CM

## 2017-08-21 LAB — URINALYSIS, MICROSCOPIC

## 2017-08-29 ENCOUNTER — Encounter: Admit: 2017-08-29 | Discharge: 2017-08-29 | Payer: MEDICARE

## 2017-09-11 ENCOUNTER — Ambulatory Visit: Admit: 2017-09-11 | Discharge: 2017-09-11 | Payer: MEDICARE

## 2017-09-11 ENCOUNTER — Encounter: Admit: 2017-09-11 | Discharge: 2017-09-11 | Payer: MEDICARE

## 2017-09-11 DIAGNOSIS — I712 Thoracic aortic aneurysm, without rupture: Principal | ICD-10-CM

## 2017-09-11 DIAGNOSIS — M1990 Unspecified osteoarthritis, unspecified site: ICD-10-CM

## 2017-09-11 DIAGNOSIS — G8929 Other chronic pain: ICD-10-CM

## 2017-09-11 DIAGNOSIS — E785 Hyperlipidemia, unspecified: ICD-10-CM

## 2017-09-11 DIAGNOSIS — I493 Ventricular premature depolarization: ICD-10-CM

## 2017-09-11 DIAGNOSIS — M5441 Lumbago with sciatica, right side: Principal | ICD-10-CM

## 2017-09-11 DIAGNOSIS — N529 Male erectile dysfunction, unspecified: ICD-10-CM

## 2017-09-11 DIAGNOSIS — G609 Hereditary and idiopathic neuropathy, unspecified: ICD-10-CM

## 2017-09-11 DIAGNOSIS — I2510 Atherosclerotic heart disease of native coronary artery without angina pectoris: ICD-10-CM

## 2017-09-11 DIAGNOSIS — M5416 Radiculopathy, lumbar region: ICD-10-CM

## 2017-09-11 DIAGNOSIS — I2699 Other pulmonary embolism without acute cor pulmonale: ICD-10-CM

## 2017-09-11 DIAGNOSIS — I10 Essential (primary) hypertension: ICD-10-CM

## 2017-09-11 DIAGNOSIS — R768 Other specified abnormal immunological findings in serum: ICD-10-CM

## 2017-09-11 DIAGNOSIS — K760 Fatty (change of) liver, not elsewhere classified: ICD-10-CM

## 2017-09-11 DIAGNOSIS — E669 Obesity, unspecified: ICD-10-CM

## 2017-09-11 DIAGNOSIS — M130 Polyarthritis, unspecified: ICD-10-CM

## 2017-09-11 DIAGNOSIS — I428 Other cardiomyopathies: ICD-10-CM

## 2017-09-11 DIAGNOSIS — I351 Nonrheumatic aortic (valve) insufficiency: ICD-10-CM

## 2017-09-11 DIAGNOSIS — Z5181 Encounter for therapeutic drug level monitoring: ICD-10-CM

## 2017-09-12 LAB — ANTI SSA ANTI SSB AB

## 2017-09-12 LAB — ANTI SMITH(SM) ANTI RNP AB

## 2017-10-14 ENCOUNTER — Encounter: Admit: 2017-10-14 | Discharge: 2017-10-14 | Payer: MEDICARE

## 2017-10-14 MED ORDER — SIMVASTATIN 20 MG PO TAB
20 mg | ORAL_TABLET | Freq: Every evening | ORAL | 1 refills | Status: AC
Start: 2017-10-14 — End: 2018-05-06

## 2017-10-16 ENCOUNTER — Encounter: Admit: 2017-10-16 | Discharge: 2017-10-16 | Payer: MEDICARE

## 2017-10-16 ENCOUNTER — Ambulatory Visit: Admit: 2017-10-16 | Discharge: 2017-10-17 | Payer: MEDICARE

## 2017-10-16 DIAGNOSIS — M5416 Radiculopathy, lumbar region: ICD-10-CM

## 2017-10-16 DIAGNOSIS — R768 Other specified abnormal immunological findings in serum: ICD-10-CM

## 2017-10-16 DIAGNOSIS — I428 Other cardiomyopathies: ICD-10-CM

## 2017-10-16 DIAGNOSIS — E785 Hyperlipidemia, unspecified: ICD-10-CM

## 2017-10-16 DIAGNOSIS — I493 Ventricular premature depolarization: ICD-10-CM

## 2017-10-16 DIAGNOSIS — I10 Essential (primary) hypertension: ICD-10-CM

## 2017-10-16 DIAGNOSIS — I2510 Atherosclerotic heart disease of native coronary artery without angina pectoris: ICD-10-CM

## 2017-10-16 DIAGNOSIS — G609 Hereditary and idiopathic neuropathy, unspecified: ICD-10-CM

## 2017-10-16 DIAGNOSIS — I2699 Other pulmonary embolism without acute cor pulmonale: ICD-10-CM

## 2017-10-16 DIAGNOSIS — M1990 Unspecified osteoarthritis, unspecified site: ICD-10-CM

## 2017-10-16 DIAGNOSIS — I712 Thoracic aortic aneurysm, without rupture: Principal | ICD-10-CM

## 2017-10-16 DIAGNOSIS — E669 Obesity, unspecified: ICD-10-CM

## 2017-10-16 DIAGNOSIS — I351 Nonrheumatic aortic (valve) insufficiency: ICD-10-CM

## 2017-10-16 DIAGNOSIS — N529 Male erectile dysfunction, unspecified: ICD-10-CM

## 2017-10-16 DIAGNOSIS — M47817 Spondylosis without myelopathy or radiculopathy, lumbosacral region: Principal | ICD-10-CM

## 2017-10-16 DIAGNOSIS — K760 Fatty (change of) liver, not elsewhere classified: ICD-10-CM

## 2017-10-16 MED ORDER — TIZANIDINE 2 MG PO TAB
2-4 mg | ORAL_TABLET | Freq: Three times a day (TID) | ORAL | 3 refills | Status: AC | PRN
Start: 2017-10-16 — End: 2018-10-29

## 2017-10-16 MED ORDER — TRAMADOL 50 MG PO TAB
50 mg | ORAL_TABLET | Freq: Two times a day (BID) | ORAL | 2 refills | Status: AC | PRN
Start: 2017-10-16 — End: ?

## 2017-10-16 MED ORDER — PREGABALIN 150 MG PO CAP
150 mg | ORAL_CAPSULE | Freq: Every day | ORAL | 2 refills | Status: AC
Start: 2017-10-16 — End: 2017-10-23

## 2017-10-17 DIAGNOSIS — Z9689 Presence of other specified functional implants: ICD-10-CM

## 2017-10-17 DIAGNOSIS — M47819 Spondylosis without myelopathy or radiculopathy, site unspecified: ICD-10-CM

## 2017-10-17 DIAGNOSIS — M62838 Other muscle spasm: ICD-10-CM

## 2017-10-17 DIAGNOSIS — M7918 Myalgia, other site: ICD-10-CM

## 2017-10-21 ENCOUNTER — Encounter: Admit: 2017-10-21 | Discharge: 2017-10-21 | Payer: MEDICARE

## 2017-10-21 MED ORDER — CARVEDILOL 12.5 MG PO TAB
12.5 mg | ORAL_TABLET | Freq: Two times a day (BID) | ORAL | 3 refills | 90.00000 days | Status: AC
Start: 2017-10-21 — End: 2018-04-21

## 2017-10-25 ENCOUNTER — Ambulatory Visit: Admit: 2017-10-25 | Discharge: 2017-10-25 | Payer: MEDICARE

## 2017-10-25 ENCOUNTER — Encounter: Admit: 2017-10-25 | Discharge: 2017-10-25 | Payer: MEDICARE

## 2017-10-25 DIAGNOSIS — E669 Obesity, unspecified: ICD-10-CM

## 2017-10-25 DIAGNOSIS — I2510 Atherosclerotic heart disease of native coronary artery without angina pectoris: ICD-10-CM

## 2017-10-25 DIAGNOSIS — G609 Hereditary and idiopathic neuropathy, unspecified: ICD-10-CM

## 2017-10-25 DIAGNOSIS — I2699 Other pulmonary embolism without acute cor pulmonale: ICD-10-CM

## 2017-10-25 DIAGNOSIS — I428 Other cardiomyopathies: ICD-10-CM

## 2017-10-25 DIAGNOSIS — I493 Ventricular premature depolarization: ICD-10-CM

## 2017-10-25 DIAGNOSIS — I351 Nonrheumatic aortic (valve) insufficiency: ICD-10-CM

## 2017-10-25 DIAGNOSIS — N529 Male erectile dysfunction, unspecified: ICD-10-CM

## 2017-10-25 DIAGNOSIS — I712 Thoracic aortic aneurysm, without rupture: Principal | ICD-10-CM

## 2017-10-25 DIAGNOSIS — K760 Fatty (change of) liver, not elsewhere classified: ICD-10-CM

## 2017-10-25 DIAGNOSIS — M5416 Radiculopathy, lumbar region: Principal | ICD-10-CM

## 2017-10-25 DIAGNOSIS — E785 Hyperlipidemia, unspecified: Secondary | ICD-10-CM

## 2017-10-25 DIAGNOSIS — R768 Other specified abnormal immunological findings in serum: ICD-10-CM

## 2017-10-25 DIAGNOSIS — M47817 Spondylosis without myelopathy or radiculopathy, lumbosacral region: Principal | ICD-10-CM

## 2017-10-25 DIAGNOSIS — I10 Essential (primary) hypertension: ICD-10-CM

## 2017-10-25 DIAGNOSIS — M1990 Unspecified osteoarthritis, unspecified site: ICD-10-CM

## 2017-10-25 MED ORDER — BUPIVACAINE (PF) 0.25 % (2.5 MG/ML) IJ SOLN
6 mL | Freq: Once | INTRAMUSCULAR | 0 refills | Status: AC
Start: 2017-10-25 — End: ?

## 2017-10-28 ENCOUNTER — Encounter: Admit: 2017-10-28 | Discharge: 2017-10-28 | Payer: MEDICARE

## 2017-10-29 ENCOUNTER — Ambulatory Visit: Admit: 2017-10-29 | Discharge: 2017-10-29 | Payer: MEDICARE

## 2017-10-29 ENCOUNTER — Encounter: Admit: 2017-10-29 | Discharge: 2017-10-29 | Payer: MEDICARE

## 2017-10-29 DIAGNOSIS — I2699 Other pulmonary embolism without acute cor pulmonale: ICD-10-CM

## 2017-10-29 DIAGNOSIS — M1990 Unspecified osteoarthritis, unspecified site: ICD-10-CM

## 2017-10-29 DIAGNOSIS — I428 Other cardiomyopathies: ICD-10-CM

## 2017-10-29 DIAGNOSIS — I10 Essential (primary) hypertension: ICD-10-CM

## 2017-10-29 DIAGNOSIS — N529 Male erectile dysfunction, unspecified: ICD-10-CM

## 2017-10-29 DIAGNOSIS — K760 Fatty (change of) liver, not elsewhere classified: ICD-10-CM

## 2017-10-29 DIAGNOSIS — I351 Nonrheumatic aortic (valve) insufficiency: ICD-10-CM

## 2017-10-29 DIAGNOSIS — E669 Obesity, unspecified: ICD-10-CM

## 2017-10-29 DIAGNOSIS — I493 Ventricular premature depolarization: ICD-10-CM

## 2017-10-29 DIAGNOSIS — I712 Thoracic aortic aneurysm, without rupture: Principal | ICD-10-CM

## 2017-10-29 DIAGNOSIS — I2510 Atherosclerotic heart disease of native coronary artery without angina pectoris: ICD-10-CM

## 2017-10-29 DIAGNOSIS — M5416 Radiculopathy, lumbar region: ICD-10-CM

## 2017-10-29 DIAGNOSIS — R768 Other specified abnormal immunological findings in serum: ICD-10-CM

## 2017-10-29 DIAGNOSIS — G609 Hereditary and idiopathic neuropathy, unspecified: ICD-10-CM

## 2017-10-29 DIAGNOSIS — E785 Hyperlipidemia, unspecified: Secondary | ICD-10-CM

## 2017-10-29 MED ORDER — PREGABALIN 150 MG PO CAP
150 mg | ORAL_CAPSULE | Freq: Two times a day (BID) | ORAL | 2 refills | Status: AC
Start: 2017-10-29 — End: 2017-11-11

## 2017-10-29 MED ORDER — PERFLUTREN LIPID MICROSPHERES 1.1 MG/ML IV SUSP
1-20 mL | Freq: Once | INTRAVENOUS | 0 refills | Status: CP | PRN
Start: 2017-10-29 — End: ?
  Administered 2017-10-29: 15:00:00 2 mL via INTRAVENOUS

## 2017-11-04 ENCOUNTER — Encounter: Admit: 2017-11-04 | Discharge: 2017-11-04 | Payer: MEDICARE

## 2017-11-04 MED ORDER — RIVAROXABAN 20 MG PO TAB
20 mg | ORAL_TABLET | Freq: Every day | ORAL | 3 refills | 30.00000 days | Status: AC
Start: 2017-11-04 — End: 2018-11-18

## 2017-11-08 ENCOUNTER — Encounter: Admit: 2017-11-08 | Discharge: 2017-11-08 | Payer: MEDICARE

## 2017-11-08 ENCOUNTER — Ambulatory Visit: Admit: 2017-11-08 | Discharge: 2017-11-08 | Payer: MEDICARE

## 2017-11-08 ENCOUNTER — Ambulatory Visit: Admit: 2017-11-08 | Discharge: 2017-11-09 | Payer: MEDICARE

## 2017-11-08 DIAGNOSIS — I2699 Other pulmonary embolism without acute cor pulmonale: ICD-10-CM

## 2017-11-08 DIAGNOSIS — I428 Other cardiomyopathies: ICD-10-CM

## 2017-11-08 DIAGNOSIS — R768 Other specified abnormal immunological findings in serum: ICD-10-CM

## 2017-11-08 DIAGNOSIS — I493 Ventricular premature depolarization: ICD-10-CM

## 2017-11-08 DIAGNOSIS — I2510 Atherosclerotic heart disease of native coronary artery without angina pectoris: ICD-10-CM

## 2017-11-08 DIAGNOSIS — M1990 Unspecified osteoarthritis, unspecified site: ICD-10-CM

## 2017-11-08 DIAGNOSIS — M5416 Radiculopathy, lumbar region: ICD-10-CM

## 2017-11-08 DIAGNOSIS — N529 Male erectile dysfunction, unspecified: ICD-10-CM

## 2017-11-08 DIAGNOSIS — I10 Essential (primary) hypertension: ICD-10-CM

## 2017-11-08 DIAGNOSIS — K760 Fatty (change of) liver, not elsewhere classified: ICD-10-CM

## 2017-11-08 DIAGNOSIS — I712 Thoracic aortic aneurysm, without rupture: Principal | ICD-10-CM

## 2017-11-08 DIAGNOSIS — E669 Obesity, unspecified: ICD-10-CM

## 2017-11-08 DIAGNOSIS — E785 Hyperlipidemia, unspecified: Secondary | ICD-10-CM

## 2017-11-08 DIAGNOSIS — G609 Hereditary and idiopathic neuropathy, unspecified: ICD-10-CM

## 2017-11-08 DIAGNOSIS — I351 Nonrheumatic aortic (valve) insufficiency: ICD-10-CM

## 2017-11-08 MED ORDER — BUPIVACAINE (PF) 0.5 % (5 MG/ML) IJ SOLN
10 mL | Freq: Once | INTRAMUSCULAR | 0 refills | Status: CP
Start: 2017-11-08 — End: ?

## 2017-11-08 MED ORDER — LIDOCAINE (PF) 10 MG/ML (1 %) IJ SOLN
2 mL | Freq: Once | INTRAMUSCULAR | 0 refills | Status: CP
Start: 2017-11-08 — End: ?

## 2017-11-11 ENCOUNTER — Encounter: Admit: 2017-11-11 | Discharge: 2017-11-11 | Payer: MEDICARE

## 2017-11-11 DIAGNOSIS — M5116 Intervertebral disc disorders with radiculopathy, lumbar region: Principal | ICD-10-CM

## 2017-11-11 DIAGNOSIS — M47817 Spondylosis without myelopathy or radiculopathy, lumbosacral region: ICD-10-CM

## 2017-11-11 MED ORDER — PREGABALIN 150 MG PO CAP
150 mg | ORAL_CAPSULE | Freq: Two times a day (BID) | ORAL | 2 refills | Status: AC
Start: 2017-11-11 — End: 2018-05-06

## 2017-11-12 ENCOUNTER — Encounter: Admit: 2017-11-12 | Discharge: 2017-11-12 | Payer: MEDICARE

## 2017-11-25 ENCOUNTER — Encounter: Admit: 2017-11-25 | Discharge: 2017-11-25 | Payer: MEDICARE

## 2017-12-10 ENCOUNTER — Encounter: Admit: 2017-12-10 | Discharge: 2017-12-10 | Payer: MEDICARE

## 2017-12-10 DIAGNOSIS — E785 Hyperlipidemia, unspecified: Secondary | ICD-10-CM

## 2017-12-10 DIAGNOSIS — M1990 Unspecified osteoarthritis, unspecified site: ICD-10-CM

## 2017-12-10 DIAGNOSIS — N529 Male erectile dysfunction, unspecified: ICD-10-CM

## 2017-12-10 DIAGNOSIS — R768 Other specified abnormal immunological findings in serum: ICD-10-CM

## 2017-12-10 DIAGNOSIS — I10 Essential (primary) hypertension: Principal | ICD-10-CM

## 2017-12-10 DIAGNOSIS — I2699 Other pulmonary embolism without acute cor pulmonale: ICD-10-CM

## 2017-12-10 DIAGNOSIS — I428 Other cardiomyopathies: ICD-10-CM

## 2017-12-10 DIAGNOSIS — K760 Fatty (change of) liver, not elsewhere classified: ICD-10-CM

## 2017-12-10 DIAGNOSIS — I2510 Atherosclerotic heart disease of native coronary artery without angina pectoris: ICD-10-CM

## 2017-12-10 DIAGNOSIS — I493 Ventricular premature depolarization: ICD-10-CM

## 2017-12-10 DIAGNOSIS — I351 Nonrheumatic aortic (valve) insufficiency: ICD-10-CM

## 2017-12-10 DIAGNOSIS — G609 Hereditary and idiopathic neuropathy, unspecified: ICD-10-CM

## 2017-12-10 DIAGNOSIS — E669 Obesity, unspecified: ICD-10-CM

## 2017-12-10 DIAGNOSIS — M5416 Radiculopathy, lumbar region: ICD-10-CM

## 2017-12-10 DIAGNOSIS — I712 Thoracic aortic aneurysm, without rupture: Principal | ICD-10-CM

## 2017-12-10 MED ORDER — LISINOPRIL 10 MG PO TAB
10 mg | ORAL_TABLET | Freq: Every day | ORAL | 1 refills | Status: AC
Start: 2017-12-10 — End: 2018-06-10

## 2017-12-13 ENCOUNTER — Encounter: Admit: 2017-12-13 | Discharge: 2017-12-13 | Payer: MEDICARE

## 2017-12-13 ENCOUNTER — Ambulatory Visit: Admit: 2017-12-13 | Discharge: 2017-12-13 | Payer: MEDICARE

## 2017-12-13 DIAGNOSIS — I2699 Other pulmonary embolism without acute cor pulmonale: ICD-10-CM

## 2017-12-13 DIAGNOSIS — I351 Nonrheumatic aortic (valve) insufficiency: ICD-10-CM

## 2017-12-13 DIAGNOSIS — R768 Other specified abnormal immunological findings in serum: ICD-10-CM

## 2017-12-13 DIAGNOSIS — M1990 Unspecified osteoarthritis, unspecified site: ICD-10-CM

## 2017-12-13 DIAGNOSIS — N529 Male erectile dysfunction, unspecified: ICD-10-CM

## 2017-12-13 DIAGNOSIS — I428 Other cardiomyopathies: ICD-10-CM

## 2017-12-13 DIAGNOSIS — M47817 Spondylosis without myelopathy or radiculopathy, lumbosacral region: Principal | ICD-10-CM

## 2017-12-13 DIAGNOSIS — I2510 Atherosclerotic heart disease of native coronary artery without angina pectoris: ICD-10-CM

## 2017-12-13 DIAGNOSIS — K760 Fatty (change of) liver, not elsewhere classified: ICD-10-CM

## 2017-12-13 DIAGNOSIS — I712 Thoracic aortic aneurysm, without rupture: Principal | ICD-10-CM

## 2017-12-13 DIAGNOSIS — E669 Obesity, unspecified: ICD-10-CM

## 2017-12-13 DIAGNOSIS — I493 Ventricular premature depolarization: ICD-10-CM

## 2017-12-13 DIAGNOSIS — E785 Hyperlipidemia, unspecified: ICD-10-CM

## 2017-12-13 DIAGNOSIS — M5416 Radiculopathy, lumbar region: ICD-10-CM

## 2017-12-13 DIAGNOSIS — G609 Hereditary and idiopathic neuropathy, unspecified: ICD-10-CM

## 2017-12-13 DIAGNOSIS — I10 Essential (primary) hypertension: ICD-10-CM

## 2017-12-13 MED ORDER — MIDAZOLAM 1 MG/ML IJ SOLN
1-2 mg | Freq: Once | INTRAVENOUS | 0 refills | Status: CP
Start: 2017-12-13 — End: ?

## 2017-12-13 MED ORDER — LACTATED RINGERS IV SOLP
1000 mL | INTRAVENOUS | 0 refills | Status: AC
Start: 2017-12-13 — End: ?

## 2017-12-13 MED ORDER — FENTANYL CITRATE (PF) 50 MCG/ML IJ SOLN
25 ug | INTRAVENOUS | 0 refills | Status: DC | PRN
Start: 2017-12-13 — End: 2018-02-03

## 2017-12-13 MED ORDER — LIDOCAINE (PF) 10 MG/ML (1 %) IJ SOLN
.1-2 mL | INTRAMUSCULAR | 0 refills | Status: DC | PRN
Start: 2017-12-13 — End: 2018-02-03

## 2017-12-29 ENCOUNTER — Encounter: Admit: 2017-12-29 | Discharge: 2017-12-29 | Payer: MEDICARE

## 2017-12-31 ENCOUNTER — Encounter: Admit: 2017-12-31 | Discharge: 2017-12-31 | Payer: MEDICARE

## 2017-12-31 DIAGNOSIS — E669 Obesity, unspecified: Principal | ICD-10-CM

## 2018-01-03 ENCOUNTER — Encounter: Admit: 2018-01-03 | Discharge: 2018-01-03 | Payer: MEDICARE

## 2018-01-03 MED ORDER — METFORMIN 500 MG PO TAB
500 mg | ORAL_TABLET | Freq: Every day | ORAL | 1 refills | Status: AC
Start: 2018-01-03 — End: 2018-06-30

## 2018-01-16 ENCOUNTER — Ambulatory Visit: Admit: 2018-01-16 | Discharge: 2018-01-16 | Payer: MEDICARE

## 2018-01-16 ENCOUNTER — Encounter: Admit: 2018-01-16 | Discharge: 2018-01-16 | Payer: MEDICARE

## 2018-01-16 DIAGNOSIS — T85192D Other mechanical complication of implanted electronic neurostimulator (electrode) of spinal cord, subsequent encounter: ICD-10-CM

## 2018-01-16 DIAGNOSIS — K760 Fatty (change of) liver, not elsewhere classified: ICD-10-CM

## 2018-01-16 DIAGNOSIS — N529 Male erectile dysfunction, unspecified: ICD-10-CM

## 2018-01-16 DIAGNOSIS — E669 Obesity, unspecified: ICD-10-CM

## 2018-01-16 DIAGNOSIS — G609 Hereditary and idiopathic neuropathy, unspecified: ICD-10-CM

## 2018-01-16 DIAGNOSIS — M5416 Radiculopathy, lumbar region: ICD-10-CM

## 2018-01-16 DIAGNOSIS — I351 Nonrheumatic aortic (valve) insufficiency: ICD-10-CM

## 2018-01-16 DIAGNOSIS — Z9689 Presence of other specified functional implants: Principal | ICD-10-CM

## 2018-01-16 DIAGNOSIS — R768 Other specified abnormal immunological findings in serum: ICD-10-CM

## 2018-01-16 DIAGNOSIS — I2699 Other pulmonary embolism without acute cor pulmonale: ICD-10-CM

## 2018-01-16 DIAGNOSIS — M1990 Unspecified osteoarthritis, unspecified site: ICD-10-CM

## 2018-01-16 DIAGNOSIS — E785 Hyperlipidemia, unspecified: ICD-10-CM

## 2018-01-16 DIAGNOSIS — I2510 Atherosclerotic heart disease of native coronary artery without angina pectoris: ICD-10-CM

## 2018-01-16 DIAGNOSIS — I10 Essential (primary) hypertension: ICD-10-CM

## 2018-01-16 DIAGNOSIS — I493 Ventricular premature depolarization: ICD-10-CM

## 2018-01-16 DIAGNOSIS — I712 Thoracic aortic aneurysm, without rupture: Principal | ICD-10-CM

## 2018-01-16 DIAGNOSIS — I428 Other cardiomyopathies: ICD-10-CM

## 2018-01-22 ENCOUNTER — Ambulatory Visit: Admit: 2018-01-22 | Discharge: 2018-01-22 | Payer: MEDICARE

## 2018-01-22 ENCOUNTER — Encounter: Admit: 2018-01-22 | Discharge: 2018-01-22 | Payer: MEDICARE

## 2018-01-22 DIAGNOSIS — M5416 Radiculopathy, lumbar region: Principal | ICD-10-CM

## 2018-01-22 DIAGNOSIS — Z9689 Presence of other specified functional implants: ICD-10-CM

## 2018-01-22 DIAGNOSIS — T85192D Other mechanical complication of implanted electronic neurostimulator (electrode) of spinal cord, subsequent encounter: ICD-10-CM

## 2018-01-30 ENCOUNTER — Ambulatory Visit: Admit: 2018-01-30 | Discharge: 2018-01-31 | Payer: MEDICARE

## 2018-01-30 ENCOUNTER — Encounter: Admit: 2018-01-30 | Discharge: 2018-01-30 | Payer: MEDICARE

## 2018-01-30 DIAGNOSIS — N529 Male erectile dysfunction, unspecified: ICD-10-CM

## 2018-01-30 DIAGNOSIS — I2699 Other pulmonary embolism without acute cor pulmonale: ICD-10-CM

## 2018-01-30 DIAGNOSIS — I10 Essential (primary) hypertension: ICD-10-CM

## 2018-01-30 DIAGNOSIS — M5416 Radiculopathy, lumbar region: ICD-10-CM

## 2018-01-30 DIAGNOSIS — I2510 Atherosclerotic heart disease of native coronary artery without angina pectoris: ICD-10-CM

## 2018-01-30 DIAGNOSIS — K760 Fatty (change of) liver, not elsewhere classified: ICD-10-CM

## 2018-01-30 DIAGNOSIS — I712 Thoracic aortic aneurysm, without rupture: Principal | ICD-10-CM

## 2018-01-30 DIAGNOSIS — R768 Other specified abnormal immunological findings in serum: ICD-10-CM

## 2018-01-30 DIAGNOSIS — I493 Ventricular premature depolarization: ICD-10-CM

## 2018-01-30 DIAGNOSIS — E669 Obesity, unspecified: ICD-10-CM

## 2018-01-30 DIAGNOSIS — E785 Hyperlipidemia, unspecified: ICD-10-CM

## 2018-01-30 DIAGNOSIS — G609 Hereditary and idiopathic neuropathy, unspecified: ICD-10-CM

## 2018-01-30 DIAGNOSIS — I428 Other cardiomyopathies: ICD-10-CM

## 2018-01-30 DIAGNOSIS — M1990 Unspecified osteoarthritis, unspecified site: ICD-10-CM

## 2018-01-30 DIAGNOSIS — I351 Nonrheumatic aortic (valve) insufficiency: ICD-10-CM

## 2018-01-31 DIAGNOSIS — M5416 Radiculopathy, lumbar region: Principal | ICD-10-CM

## 2018-02-03 ENCOUNTER — Encounter: Admit: 2018-02-03 | Discharge: 2018-02-03 | Payer: MEDICARE

## 2018-02-03 DIAGNOSIS — I493 Ventricular premature depolarization: ICD-10-CM

## 2018-02-03 DIAGNOSIS — K760 Fatty (change of) liver, not elsewhere classified: ICD-10-CM

## 2018-02-03 DIAGNOSIS — N529 Male erectile dysfunction, unspecified: ICD-10-CM

## 2018-02-03 DIAGNOSIS — I2699 Other pulmonary embolism without acute cor pulmonale: ICD-10-CM

## 2018-02-03 DIAGNOSIS — I10 Essential (primary) hypertension: ICD-10-CM

## 2018-02-03 DIAGNOSIS — I428 Other cardiomyopathies: ICD-10-CM

## 2018-02-03 DIAGNOSIS — R768 Other specified abnormal immunological findings in serum: ICD-10-CM

## 2018-02-03 DIAGNOSIS — M1990 Unspecified osteoarthritis, unspecified site: ICD-10-CM

## 2018-02-03 DIAGNOSIS — M5116 Intervertebral disc disorders with radiculopathy, lumbar region: Principal | ICD-10-CM

## 2018-02-03 DIAGNOSIS — I712 Thoracic aortic aneurysm, without rupture: Principal | ICD-10-CM

## 2018-02-03 DIAGNOSIS — I2510 Atherosclerotic heart disease of native coronary artery without angina pectoris: ICD-10-CM

## 2018-02-03 DIAGNOSIS — E669 Obesity, unspecified: ICD-10-CM

## 2018-02-03 DIAGNOSIS — E785 Hyperlipidemia, unspecified: Secondary | ICD-10-CM

## 2018-02-03 DIAGNOSIS — M5416 Radiculopathy, lumbar region: ICD-10-CM

## 2018-02-03 DIAGNOSIS — I351 Nonrheumatic aortic (valve) insufficiency: ICD-10-CM

## 2018-02-03 DIAGNOSIS — G609 Hereditary and idiopathic neuropathy, unspecified: ICD-10-CM

## 2018-02-03 MED ORDER — IOPAMIDOL 41 % IT SOLN
2.5 mL | Freq: Once | EPIDURAL | 0 refills | Status: CP
Start: 2018-02-03 — End: ?
  Administered 2018-02-03: 17:00:00 2.5 mL via EPIDURAL

## 2018-02-03 MED ORDER — TRIAMCINOLONE ACETONIDE 40 MG/ML IJ SUSP
80 mg | Freq: Once | EPIDURAL | 0 refills | Status: CP
Start: 2018-02-03 — End: ?
  Administered 2018-02-03: 17:00:00 80 mg via EPIDURAL

## 2018-02-04 ENCOUNTER — Ambulatory Visit: Admit: 2018-02-03 | Discharge: 2018-02-04 | Payer: MEDICARE

## 2018-02-04 DIAGNOSIS — M5116 Intervertebral disc disorders with radiculopathy, lumbar region: Principal | ICD-10-CM

## 2018-02-04 DIAGNOSIS — Z87891 Personal history of nicotine dependence: ICD-10-CM

## 2018-02-04 DIAGNOSIS — Z86711 Personal history of pulmonary embolism: ICD-10-CM

## 2018-02-04 DIAGNOSIS — Z981 Arthrodesis status: ICD-10-CM

## 2018-02-13 ENCOUNTER — Encounter: Admit: 2018-02-13 | Discharge: 2018-03-08 | Payer: MEDICARE

## 2018-02-18 ENCOUNTER — Ambulatory Visit: Admit: 2018-02-18 | Discharge: 2018-02-18 | Payer: MEDICARE

## 2018-02-18 ENCOUNTER — Encounter: Admit: 2018-02-18 | Discharge: 2018-02-18 | Payer: MEDICARE

## 2018-02-18 DIAGNOSIS — L300 Nummular dermatitis: ICD-10-CM

## 2018-02-18 DIAGNOSIS — Z23 Encounter for immunization: Principal | ICD-10-CM

## 2018-02-18 DIAGNOSIS — I2699 Other pulmonary embolism without acute cor pulmonale: ICD-10-CM

## 2018-02-18 DIAGNOSIS — R739 Hyperglycemia, unspecified: Secondary | ICD-10-CM

## 2018-02-18 DIAGNOSIS — I428 Other cardiomyopathies: ICD-10-CM

## 2018-02-18 DIAGNOSIS — N529 Male erectile dysfunction, unspecified: ICD-10-CM

## 2018-02-18 DIAGNOSIS — E785 Hyperlipidemia, unspecified: ICD-10-CM

## 2018-02-18 DIAGNOSIS — I351 Nonrheumatic aortic (valve) insufficiency: ICD-10-CM

## 2018-02-18 DIAGNOSIS — I493 Ventricular premature depolarization: ICD-10-CM

## 2018-02-18 DIAGNOSIS — R768 Other specified abnormal immunological findings in serum: ICD-10-CM

## 2018-02-18 DIAGNOSIS — K760 Fatty (change of) liver, not elsewhere classified: ICD-10-CM

## 2018-02-18 DIAGNOSIS — I712 Thoracic aortic aneurysm, without rupture: Principal | ICD-10-CM

## 2018-02-18 DIAGNOSIS — G609 Hereditary and idiopathic neuropathy, unspecified: ICD-10-CM

## 2018-02-18 DIAGNOSIS — M1990 Unspecified osteoarthritis, unspecified site: ICD-10-CM

## 2018-02-18 DIAGNOSIS — I2510 Atherosclerotic heart disease of native coronary artery without angina pectoris: ICD-10-CM

## 2018-02-18 DIAGNOSIS — I10 Essential (primary) hypertension: ICD-10-CM

## 2018-02-18 DIAGNOSIS — E669 Obesity, unspecified: ICD-10-CM

## 2018-02-18 DIAGNOSIS — M5416 Radiculopathy, lumbar region: ICD-10-CM

## 2018-02-18 LAB — CBC AND DIFF
Lab: 15 % — ABNORMAL HIGH (ref 11–15)
Lab: 15 g/dL — ABNORMAL HIGH (ref 13.5–16.5)
Lab: 157 K/UL (ref 150–400)
Lab: 22 % — ABNORMAL LOW (ref 24–44)
Lab: 29 pg (ref 26–34)
Lab: 33 g/dL (ref 32.0–36.0)
Lab: 46 % (ref 40–50)
Lab: 5.2 M/UL (ref 4.4–5.5)
Lab: 67 % (ref 41–77)
Lab: 8.1 K/UL (ref 4.5–11.0)
Lab: 89 FL (ref 80–100)
Lab: 9 % (ref 4–12)
Lab: 9.6 FL (ref 7–11)

## 2018-02-18 LAB — COMPREHENSIVE METABOLIC PANEL
Lab: 136 MMOL/L — ABNORMAL LOW (ref >40)
Lab: 4.7 MMOL/L — ABNORMAL HIGH (ref <100)

## 2018-02-18 LAB — LIPID PROFILE
Lab: 154 mg/dL — ABNORMAL HIGH (ref <150)
Lab: 180 mg/dL (ref <200)

## 2018-02-18 LAB — HEMOGLOBIN A1C: Lab: 6.2 % — ABNORMAL HIGH (ref >60)

## 2018-02-18 MED ORDER — FLUOCINONIDE 0.05 % TP CREA
Freq: Two times a day (BID) | 0 refills | Status: AC
Start: 2018-02-18 — End: 2018-10-31

## 2018-02-28 ENCOUNTER — Ambulatory Visit: Admit: 2018-02-28 | Discharge: 2018-03-01 | Payer: MEDICARE

## 2018-02-28 ENCOUNTER — Encounter: Admit: 2018-02-28 | Discharge: 2018-02-28 | Payer: MEDICARE

## 2018-02-28 DIAGNOSIS — G609 Hereditary and idiopathic neuropathy, unspecified: ICD-10-CM

## 2018-02-28 DIAGNOSIS — N529 Male erectile dysfunction, unspecified: ICD-10-CM

## 2018-02-28 DIAGNOSIS — I351 Nonrheumatic aortic (valve) insufficiency: ICD-10-CM

## 2018-02-28 DIAGNOSIS — R768 Other specified abnormal immunological findings in serum: ICD-10-CM

## 2018-02-28 DIAGNOSIS — M1990 Unspecified osteoarthritis, unspecified site: ICD-10-CM

## 2018-02-28 DIAGNOSIS — I712 Thoracic aortic aneurysm, without rupture: Principal | ICD-10-CM

## 2018-02-28 DIAGNOSIS — I2699 Other pulmonary embolism without acute cor pulmonale: ICD-10-CM

## 2018-02-28 DIAGNOSIS — I2510 Atherosclerotic heart disease of native coronary artery without angina pectoris: ICD-10-CM

## 2018-02-28 DIAGNOSIS — I428 Other cardiomyopathies: ICD-10-CM

## 2018-02-28 DIAGNOSIS — K760 Fatty (change of) liver, not elsewhere classified: ICD-10-CM

## 2018-02-28 DIAGNOSIS — I10 Essential (primary) hypertension: ICD-10-CM

## 2018-02-28 DIAGNOSIS — E785 Hyperlipidemia, unspecified: ICD-10-CM

## 2018-02-28 DIAGNOSIS — E669 Obesity, unspecified: ICD-10-CM

## 2018-02-28 DIAGNOSIS — I493 Ventricular premature depolarization: ICD-10-CM

## 2018-02-28 DIAGNOSIS — M5416 Radiculopathy, lumbar region: ICD-10-CM

## 2018-03-01 DIAGNOSIS — Z79899 Other long term (current) drug therapy: ICD-10-CM

## 2018-03-01 DIAGNOSIS — M130 Polyarthritis, unspecified: Principal | ICD-10-CM

## 2018-03-01 DIAGNOSIS — Z86711 Personal history of pulmonary embolism: ICD-10-CM

## 2018-03-01 DIAGNOSIS — G8929 Other chronic pain: ICD-10-CM

## 2018-03-01 DIAGNOSIS — M5440 Lumbago with sciatica, unspecified side: Secondary | ICD-10-CM

## 2018-03-01 DIAGNOSIS — R768 Other specified abnormal immunological findings in serum: ICD-10-CM

## 2018-03-02 ENCOUNTER — Encounter: Admit: 2018-03-02 | Discharge: 2018-03-02 | Payer: MEDICARE

## 2018-03-02 DIAGNOSIS — G609 Hereditary and idiopathic neuropathy, unspecified: ICD-10-CM

## 2018-03-02 DIAGNOSIS — I2510 Atherosclerotic heart disease of native coronary artery without angina pectoris: ICD-10-CM

## 2018-03-02 DIAGNOSIS — R768 Other specified abnormal immunological findings in serum: ICD-10-CM

## 2018-03-02 DIAGNOSIS — I10 Essential (primary) hypertension: ICD-10-CM

## 2018-03-02 DIAGNOSIS — E669 Obesity, unspecified: ICD-10-CM

## 2018-03-02 DIAGNOSIS — K760 Fatty (change of) liver, not elsewhere classified: ICD-10-CM

## 2018-03-02 DIAGNOSIS — I2699 Other pulmonary embolism without acute cor pulmonale: ICD-10-CM

## 2018-03-02 DIAGNOSIS — I712 Thoracic aortic aneurysm, without rupture: Principal | ICD-10-CM

## 2018-03-02 DIAGNOSIS — N529 Male erectile dysfunction, unspecified: ICD-10-CM

## 2018-03-02 DIAGNOSIS — I493 Ventricular premature depolarization: ICD-10-CM

## 2018-03-02 DIAGNOSIS — M5416 Radiculopathy, lumbar region: ICD-10-CM

## 2018-03-02 DIAGNOSIS — I351 Nonrheumatic aortic (valve) insufficiency: ICD-10-CM

## 2018-03-02 DIAGNOSIS — E785 Hyperlipidemia, unspecified: Secondary | ICD-10-CM

## 2018-03-02 DIAGNOSIS — M1990 Unspecified osteoarthritis, unspecified site: ICD-10-CM

## 2018-03-02 DIAGNOSIS — I428 Other cardiomyopathies: ICD-10-CM

## 2018-03-03 ENCOUNTER — Encounter: Admit: 2018-03-03 | Discharge: 2018-03-03 | Payer: MEDICARE

## 2018-03-08 DIAGNOSIS — M5416 Radiculopathy, lumbar region: Principal | ICD-10-CM

## 2018-03-18 ENCOUNTER — Encounter: Admit: 2018-03-18 | Discharge: 2018-03-18 | Payer: MEDICARE

## 2018-03-19 ENCOUNTER — Encounter: Admit: 2018-03-19 | Discharge: 2018-04-08 | Payer: MEDICARE

## 2018-03-28 ENCOUNTER — Encounter: Admit: 2018-03-28 | Discharge: 2018-03-28 | Payer: MEDICARE

## 2018-03-28 ENCOUNTER — Ambulatory Visit: Admit: 2018-03-28 | Discharge: 2018-03-29 | Payer: MEDICARE

## 2018-03-28 DIAGNOSIS — M1990 Unspecified osteoarthritis, unspecified site: ICD-10-CM

## 2018-03-28 DIAGNOSIS — I493 Ventricular premature depolarization: ICD-10-CM

## 2018-03-28 DIAGNOSIS — G609 Hereditary and idiopathic neuropathy, unspecified: ICD-10-CM

## 2018-03-28 DIAGNOSIS — E669 Obesity, unspecified: ICD-10-CM

## 2018-03-28 DIAGNOSIS — I351 Nonrheumatic aortic (valve) insufficiency: ICD-10-CM

## 2018-03-28 DIAGNOSIS — M5416 Radiculopathy, lumbar region: ICD-10-CM

## 2018-03-28 DIAGNOSIS — I2510 Atherosclerotic heart disease of native coronary artery without angina pectoris: ICD-10-CM

## 2018-03-28 DIAGNOSIS — K760 Fatty (change of) liver, not elsewhere classified: ICD-10-CM

## 2018-03-28 DIAGNOSIS — M5137 Other intervertebral disc degeneration, lumbosacral region: Secondary | ICD-10-CM

## 2018-03-28 DIAGNOSIS — R768 Other specified abnormal immunological findings in serum: ICD-10-CM

## 2018-03-28 DIAGNOSIS — E785 Hyperlipidemia, unspecified: ICD-10-CM

## 2018-03-28 DIAGNOSIS — I428 Other cardiomyopathies: ICD-10-CM

## 2018-03-28 DIAGNOSIS — N529 Male erectile dysfunction, unspecified: ICD-10-CM

## 2018-03-28 DIAGNOSIS — I2699 Other pulmonary embolism without acute cor pulmonale: ICD-10-CM

## 2018-03-28 DIAGNOSIS — I10 Essential (primary) hypertension: ICD-10-CM

## 2018-03-28 DIAGNOSIS — I712 Thoracic aortic aneurysm, without rupture: Principal | ICD-10-CM

## 2018-03-28 MED ORDER — PREGABALIN 200 MG PO CAP
200 mg | ORAL_CAPSULE | Freq: Two times a day (BID) | ORAL | 2 refills | Status: AC
Start: 2018-03-28 — End: ?

## 2018-03-29 DIAGNOSIS — M5416 Radiculopathy, lumbar region: Principal | ICD-10-CM

## 2018-03-29 DIAGNOSIS — M47819 Spondylosis without myelopathy or radiculopathy, site unspecified: ICD-10-CM

## 2018-03-29 DIAGNOSIS — Z9689 Presence of other specified functional implants: Secondary | ICD-10-CM

## 2018-04-08 DIAGNOSIS — M5416 Radiculopathy, lumbar region: Principal | ICD-10-CM

## 2018-04-14 ENCOUNTER — Encounter: Admit: 2018-04-14 | Discharge: 2018-05-09 | Payer: MEDICARE

## 2018-04-18 ENCOUNTER — Encounter: Admit: 2018-04-18 | Discharge: 2018-04-18 | Payer: MEDICARE

## 2018-04-19 ENCOUNTER — Encounter: Admit: 2018-04-19 | Discharge: 2018-04-19 | Payer: MEDICARE

## 2018-04-21 ENCOUNTER — Encounter: Admit: 2018-04-21 | Discharge: 2018-04-21 | Payer: MEDICARE

## 2018-04-21 MED ORDER — CARVEDILOL 12.5 MG PO TAB
12.5 mg | ORAL_TABLET | Freq: Two times a day (BID) | ORAL | 3 refills | 90.00000 days | Status: AC
Start: 2018-04-21 — End: 2018-11-11

## 2018-04-22 ENCOUNTER — Encounter: Admit: 2018-04-22 | Discharge: 2018-04-22 | Payer: MEDICARE

## 2018-04-29 ENCOUNTER — Encounter: Admit: 2018-04-29 | Discharge: 2018-04-29 | Payer: MEDICARE

## 2018-05-06 ENCOUNTER — Ambulatory Visit: Admit: 2018-05-06 | Discharge: 2018-05-07 | Payer: MEDICARE

## 2018-05-06 ENCOUNTER — Encounter: Admit: 2018-05-06 | Discharge: 2018-05-06 | Payer: MEDICARE

## 2018-05-06 DIAGNOSIS — I351 Nonrheumatic aortic (valve) insufficiency: Secondary | ICD-10-CM

## 2018-05-06 DIAGNOSIS — I712 Thoracic aortic aneurysm, without rupture: Secondary | ICD-10-CM

## 2018-05-06 DIAGNOSIS — E785 Hyperlipidemia, unspecified: Secondary | ICD-10-CM

## 2018-05-06 DIAGNOSIS — R768 Other specified abnormal immunological findings in serum: Secondary | ICD-10-CM

## 2018-05-06 DIAGNOSIS — I2510 Atherosclerotic heart disease of native coronary artery without angina pectoris: Secondary | ICD-10-CM

## 2018-05-06 DIAGNOSIS — I10 Essential (primary) hypertension: Secondary | ICD-10-CM

## 2018-05-06 DIAGNOSIS — G609 Hereditary and idiopathic neuropathy, unspecified: Secondary | ICD-10-CM

## 2018-05-06 DIAGNOSIS — M5416 Radiculopathy, lumbar region: Secondary | ICD-10-CM

## 2018-05-06 DIAGNOSIS — N529 Male erectile dysfunction, unspecified: Secondary | ICD-10-CM

## 2018-05-06 DIAGNOSIS — E669 Obesity, unspecified: Secondary | ICD-10-CM

## 2018-05-06 DIAGNOSIS — M1990 Unspecified osteoarthritis, unspecified site: Secondary | ICD-10-CM

## 2018-05-06 DIAGNOSIS — I428 Other cardiomyopathies: Secondary | ICD-10-CM

## 2018-05-06 DIAGNOSIS — I2699 Other pulmonary embolism without acute cor pulmonale: Secondary | ICD-10-CM

## 2018-05-06 DIAGNOSIS — I493 Ventricular premature depolarization: Secondary | ICD-10-CM

## 2018-05-06 DIAGNOSIS — K760 Fatty (change of) liver, not elsewhere classified: Secondary | ICD-10-CM

## 2018-05-06 MED ORDER — SIMVASTATIN 20 MG PO TAB
40 mg | ORAL_TABLET | Freq: Every evening | ORAL | 1 refills | Status: AC
Start: 2018-05-06 — End: 2018-09-02

## 2018-05-06 MED ORDER — SPIRONOLACTONE 25 MG PO TAB
25 mg | ORAL_TABLET | Freq: Every day | ORAL | 3 refills | 90.00000 days | Status: AC
Start: 2018-05-06 — End: 2019-05-04

## 2018-05-07 DIAGNOSIS — E785 Hyperlipidemia, unspecified: Secondary | ICD-10-CM

## 2018-05-07 DIAGNOSIS — I493 Ventricular premature depolarization: Secondary | ICD-10-CM

## 2018-05-07 DIAGNOSIS — I10 Essential (primary) hypertension: Secondary | ICD-10-CM

## 2018-05-07 DIAGNOSIS — Z86711 Personal history of pulmonary embolism: Secondary | ICD-10-CM

## 2018-05-07 DIAGNOSIS — I2510 Atherosclerotic heart disease of native coronary artery without angina pectoris: Secondary | ICD-10-CM

## 2018-05-07 DIAGNOSIS — R0789 Other chest pain: Secondary | ICD-10-CM

## 2018-05-08 ENCOUNTER — Encounter: Admit: 2018-05-08 | Discharge: 2018-05-08 | Payer: MEDICARE

## 2018-05-09 ENCOUNTER — Ambulatory Visit: Admit: 2018-05-09 | Discharge: 2018-05-09 | Payer: MEDICARE

## 2018-05-09 DIAGNOSIS — I2510 Atherosclerotic heart disease of native coronary artery without angina pectoris: Secondary | ICD-10-CM

## 2018-05-09 DIAGNOSIS — E785 Hyperlipidemia, unspecified: Secondary | ICD-10-CM

## 2018-05-09 DIAGNOSIS — M5416 Radiculopathy, lumbar region: Principal | ICD-10-CM

## 2018-05-09 DIAGNOSIS — R0789 Other chest pain: Secondary | ICD-10-CM

## 2018-05-09 MED ORDER — NITROGLYCERIN 0.4 MG SL SUBL
.4 mg | SUBLINGUAL | 0 refills | Status: AC | PRN
Start: 2018-05-09 — End: ?

## 2018-05-09 MED ORDER — SODIUM CHLORIDE 0.9 % IV SOLP
250 mL | INTRAVENOUS | 0 refills | Status: AC | PRN
Start: 2018-05-09 — End: ?

## 2018-05-09 MED ORDER — AMINOPHYLLINE 500 MG/20 ML IV SOLN
50 mg | INTRAVENOUS | 0 refills | Status: AC | PRN
Start: 2018-05-09 — End: ?

## 2018-05-09 MED ORDER — REGADENOSON 0.4 MG/5 ML IV SYRG
.4 mg | Freq: Once | INTRAVENOUS | 0 refills | Status: CP
Start: 2018-05-09 — End: ?
  Administered 2018-05-09: 14:00:00 0.4 mg via INTRAVENOUS

## 2018-05-09 MED ORDER — BENZOCAINE-MENTHOL 6-10 MG MM LOZG
1 | Freq: Once | ORAL | 0 refills | Status: AC | PRN
Start: 2018-05-09 — End: ?

## 2018-05-09 MED ORDER — ALBUTEROL SULFATE 90 MCG/ACTUATION IN HFAA
2 | RESPIRATORY_TRACT | 0 refills | Status: DC | PRN
Start: 2018-05-09 — End: 2018-05-14

## 2018-05-19 ENCOUNTER — Encounter: Admit: 2018-05-19 | Discharge: 2018-05-19 | Payer: MEDICARE

## 2018-05-19 ENCOUNTER — Ambulatory Visit: Admit: 2018-05-19 | Discharge: 2018-05-19 | Payer: MEDICARE

## 2018-05-19 DIAGNOSIS — N529 Male erectile dysfunction, unspecified: ICD-10-CM

## 2018-05-19 DIAGNOSIS — L821 Other seborrheic keratosis: ICD-10-CM

## 2018-05-19 DIAGNOSIS — I2510 Atherosclerotic heart disease of native coronary artery without angina pectoris: ICD-10-CM

## 2018-05-19 DIAGNOSIS — M1990 Unspecified osteoarthritis, unspecified site: ICD-10-CM

## 2018-05-19 DIAGNOSIS — J3089 Other allergic rhinitis: Principal | ICD-10-CM

## 2018-05-19 DIAGNOSIS — E785 Hyperlipidemia, unspecified: ICD-10-CM

## 2018-05-19 DIAGNOSIS — K760 Fatty (change of) liver, not elsewhere classified: ICD-10-CM

## 2018-05-19 DIAGNOSIS — I428 Other cardiomyopathies: ICD-10-CM

## 2018-05-19 DIAGNOSIS — I712 Thoracic aortic aneurysm, without rupture: Principal | ICD-10-CM

## 2018-05-19 DIAGNOSIS — M5416 Radiculopathy, lumbar region: ICD-10-CM

## 2018-05-19 DIAGNOSIS — I493 Ventricular premature depolarization: ICD-10-CM

## 2018-05-19 DIAGNOSIS — R0789 Other chest pain: ICD-10-CM

## 2018-05-19 DIAGNOSIS — R768 Other specified abnormal immunological findings in serum: ICD-10-CM

## 2018-05-19 DIAGNOSIS — I2699 Other pulmonary embolism without acute cor pulmonale: ICD-10-CM

## 2018-05-19 DIAGNOSIS — G609 Hereditary and idiopathic neuropathy, unspecified: ICD-10-CM

## 2018-05-19 DIAGNOSIS — E669 Obesity, unspecified: ICD-10-CM

## 2018-05-19 DIAGNOSIS — I351 Nonrheumatic aortic (valve) insufficiency: ICD-10-CM

## 2018-05-19 DIAGNOSIS — I10 Essential (primary) hypertension: ICD-10-CM

## 2018-05-19 LAB — LIVER FUNCTION PANEL
Lab: 0.3 mg/dL — ABNORMAL LOW (ref <0.4)
Lab: 1.1 mg/dL (ref 0.3–1.2)

## 2018-05-19 LAB — LIPID PROFILE
Lab: 160 mg/dL (ref <200)
Lab: 161 mg/dL — ABNORMAL HIGH (ref <150)

## 2018-05-19 MED ORDER — MONTELUKAST 10 MG PO TAB
10 mg | ORAL_TABLET | Freq: Every evening | ORAL | 3 refills | 90.00000 days | Status: AC
Start: 2018-05-19 — End: 2019-01-06

## 2018-05-19 MED ORDER — METHYLPREDNISOLONE 4 MG PO DSPK
ORAL_TABLET | 0 refills | Status: AC
Start: 2018-05-19 — End: 2018-09-12

## 2018-05-23 ENCOUNTER — Ambulatory Visit: Admit: 2018-05-23 | Discharge: 2018-05-24 | Payer: MEDICARE

## 2018-05-23 ENCOUNTER — Encounter: Admit: 2018-05-23 | Discharge: 2018-05-23 | Payer: MEDICARE

## 2018-05-23 DIAGNOSIS — R768 Other specified abnormal immunological findings in serum: ICD-10-CM

## 2018-05-23 DIAGNOSIS — I428 Other cardiomyopathies: ICD-10-CM

## 2018-05-23 DIAGNOSIS — I493 Ventricular premature depolarization: ICD-10-CM

## 2018-05-23 DIAGNOSIS — I712 Thoracic aortic aneurysm, without rupture: Principal | ICD-10-CM

## 2018-05-23 DIAGNOSIS — M1990 Unspecified osteoarthritis, unspecified site: ICD-10-CM

## 2018-05-23 DIAGNOSIS — K760 Fatty (change of) liver, not elsewhere classified: ICD-10-CM

## 2018-05-23 DIAGNOSIS — E785 Hyperlipidemia, unspecified: ICD-10-CM

## 2018-05-23 DIAGNOSIS — M5416 Radiculopathy, lumbar region: Principal | ICD-10-CM

## 2018-05-23 DIAGNOSIS — I2699 Other pulmonary embolism without acute cor pulmonale: ICD-10-CM

## 2018-05-23 DIAGNOSIS — I351 Nonrheumatic aortic (valve) insufficiency: ICD-10-CM

## 2018-05-23 DIAGNOSIS — E669 Obesity, unspecified: ICD-10-CM

## 2018-05-23 DIAGNOSIS — I2510 Atherosclerotic heart disease of native coronary artery without angina pectoris: ICD-10-CM

## 2018-05-23 DIAGNOSIS — I10 Essential (primary) hypertension: ICD-10-CM

## 2018-05-23 DIAGNOSIS — G609 Hereditary and idiopathic neuropathy, unspecified: ICD-10-CM

## 2018-05-23 DIAGNOSIS — N529 Male erectile dysfunction, unspecified: ICD-10-CM

## 2018-05-23 NOTE — Progress Notes
Date of Service: 05/23/2018     Subjective:               Victor Graham is a 68 y.o. male.        History of Present Illness    He continues to have back and leg pain.  Prior EMG showed sensorimotor polyneuropathy and right lumbar radiculopathy.  Lyrica has helped.  He also had an epidural injection in November which he says is wearing off.  He has not had another one.  He is planning right shoulder surgery in March.    He saw Dr. Lisette Grinder.  They discussed surgery for lumbar radiculopathy but he was told he need to lose significant amount of weight before surgery can be done.    He previously did physical therapy which she thought helped.    He thinks balance and walking is worse.  He has had a few falls.  He denies tingling or burning of the feet.  There is still numb.  He is not interested and does not want to add any new medication.  Tramadol helps as needed.    He denies other new neurological complaint or concern.       Review of Systems   Constitutional: Negative.    HENT: Positive for postnasal drip, tinnitus and voice change.    Eyes: Negative.    Respiratory: Negative.    Cardiovascular: Negative.    Gastrointestinal: Negative.    Endocrine: Negative.    Genitourinary: Positive for urgency.   Musculoskeletal: Positive for back pain and gait problem.   Skin: Negative.    Allergic/Immunologic: Negative.    Neurological:        Negative   Hematological: Negative.    Psychiatric/Behavioral: Negative.        Chief Complaint:  Chief Complaint   Patient presents with   ??? Back Pain     Still the same   ??? Leg Pain     still the same   ??? Balance Problems     still the same       Past Medical History:  Medical History:   Diagnosis Date   ??? Arthritis    ??? Ascending aortic aneurysm (HCC) 01/08/2008   ??? Asymptomatic PVCs 03/11/2015    03/2015 per pt, has had them for a long time & does not feel them usually    ??? CAD (coronary artery disease)    ??? Dyslipidemia    ??? Erectile dysfunction    ??? Fatty liver ??? HLD (hyperlipidemia) 01/08/2008   ??? Hypertension 01/08/2008   ??? Idiopathic polyneuropathy    ??? Lumbar radiculopathy    ??? NICM (nonischemic cardiomyopathy) (HCC)    ??? 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 Obesity    ??? Positive ANA (antinuclear antibody) 02/03/2016   ??? Pulmonary embolism [facility]) 2015 -- july 20th    takes xaralto anticoagulant -- treated at [facility]       Surgical History:  Surgical History:   Procedure Laterality Date   ??? FASCIOTOMY Right 1992   ??? SPINAL FUSION  01/2008    for low back pain   ??? HERNIA REPAIR     ??? HX JOINT REPLACEMENT     ??? KNEE SURGERY     ??? PR LAPAROSCOPY SURG RPR INITIAL INGUINAL HERNIA         Social History:   Social History     Tobacco Use   ???  Smoking status: Former Smoker     Years: 20.00     Last attempt to quit: 08/24/1991     Years since quitting: 26.7   ??? Smokeless tobacco: Never Used   Substance Use Topics   ??? Alcohol use: No     Alcohol/week: 0.0 standard drinks   ??? Drug use: No       Family History:  Family History   Problem Relation Age of Onset   ??? DVT Mother    ??? Cancer Father    ??? DVT Brother    ??? Stroke Paternal Grandfather        Allergies:  Allergies   Allergen Reactions   ??? Other [Unclassified Drug] BLISTERS     dissolving stiches in mouth after dental surgery caused blisters   ??? Lipitor [Atorvastatin] FLUSHING (SKIN)   ??? Niaspan [Niacin] FLUSHING (SKIN)   ??? Rubber SEE COMMENTS     Neoprene rubber causes contact dermatitis (CAUSED BY THIOUREA IN NEOPRENE)       Objective:         ??? carvedilol (COREG) 12.5 mg tablet Take one tablet by mouth twice daily.   ??? fluocinonide (LIDEX) 0.05 % topical cream Apply to affected area twice a day   ??? lisinopril (PRINIVIL; ZESTRIL) 10 mg tablet Take one tablet by mouth daily.   ??? meloxicam (MOBIC) 15 mg tablet Take one tablet by mouth daily as needed.   ??? metFORMIN (GLUCOPHAGE) 500 mg tablet Take one tablet by mouth daily. 1.  Continue Lyrica 200 mg twice daily  2.  Patient refuses new medication.  Consider Cymbalta, mexiletine or tricyclic antidepressant.  He does not want to start new medicine now.  3.  Follow-up with pain management.  4.  Fall precautions and prevention emphasized  5.  Follow-up with Dr. Lisette Grinder.  Ongoing weight loss and consideration of surgical intervention for lumbar radiculopathy.  6.  Follow-up PCP for monitoring of glycemic levels.  Follow-up PCP and/or rheumatology with positive ANA historically.  7.  RTC 9 months or as needed sooner.

## 2018-05-24 DIAGNOSIS — G609 Hereditary and idiopathic neuropathy, unspecified: ICD-10-CM

## 2018-06-03 ENCOUNTER — Encounter: Admit: 2018-06-03 | Discharge: 2018-06-03 | Payer: MEDICARE

## 2018-06-09 ENCOUNTER — Encounter: Admit: 2018-06-09 | Discharge: 2018-06-09 | Payer: MEDICARE

## 2018-06-09 DIAGNOSIS — E559 Vitamin D deficiency, unspecified: Principal | ICD-10-CM

## 2018-06-10 ENCOUNTER — Encounter: Admit: 2018-06-10 | Discharge: 2018-06-10 | Payer: MEDICARE

## 2018-06-10 DIAGNOSIS — I10 Essential (primary) hypertension: Principal | ICD-10-CM

## 2018-06-10 MED ORDER — CHOLECALCIFEROL (VITAMIN D3) 1,250 MCG (50,000 UNIT) PO CAP
50000 [IU] | ORAL_CAPSULE | ORAL | 0 refills | 84.00000 days | Status: AC
Start: 2018-06-10 — End: 2018-10-02

## 2018-06-10 MED ORDER — LISINOPRIL 10 MG PO TAB
10 mg | ORAL_TABLET | Freq: Every day | ORAL | 0 refills | Status: AC
Start: 2018-06-10 — End: 2018-09-12

## 2018-06-13 ENCOUNTER — Encounter: Admit: 2018-06-13 | Discharge: 2018-06-13 | Payer: MEDICARE

## 2018-06-13 NOTE — Telephone Encounter
Left a message regarding infomation colonoscopy to see if it already done or need to be schedule

## 2018-06-19 ENCOUNTER — Encounter: Admit: 2018-06-19 | Discharge: 2018-06-19 | Payer: MEDICARE

## 2018-06-30 ENCOUNTER — Encounter: Admit: 2018-06-30 | Discharge: 2018-06-30 | Payer: MEDICARE

## 2018-06-30 DIAGNOSIS — E669 Obesity, unspecified: Principal | ICD-10-CM

## 2018-06-30 MED ORDER — METFORMIN 500 MG PO TAB
500 mg | ORAL_TABLET | Freq: Every day | ORAL | 1 refills | Status: AC
Start: 2018-06-30 — End: 2018-12-16

## 2018-07-03 ENCOUNTER — Encounter: Admit: 2018-07-03 | Discharge: 2018-07-03 | Payer: MEDICARE

## 2018-07-03 MED ORDER — MELOXICAM 15 MG PO TAB
15 mg | ORAL_TABLET | Freq: Every day | ORAL | 2 refills | 30.00000 days | Status: AC | PRN
Start: 2018-07-03 — End: 2019-01-06

## 2018-07-13 ENCOUNTER — Encounter: Admit: 2018-07-13 | Discharge: 2018-07-13 | Payer: MEDICARE

## 2018-07-14 MED ORDER — PREGABALIN 200 MG PO CAP
200 mg | ORAL_CAPSULE | Freq: Two times a day (BID) | ORAL | 0 refills | Status: DC
Start: 2018-07-14 — End: 2018-10-17

## 2018-08-04 ENCOUNTER — Encounter: Admit: 2018-08-04 | Discharge: 2018-08-04 | Payer: MEDICARE

## 2018-08-06 ENCOUNTER — Encounter: Admit: 2018-08-06 | Discharge: 2018-08-06 | Payer: MEDICARE

## 2018-08-07 ENCOUNTER — Encounter: Admit: 2018-08-07 | Discharge: 2018-08-07 | Payer: MEDICARE

## 2018-08-07 ENCOUNTER — Ambulatory Visit: Admit: 2018-08-07 | Discharge: 2018-08-08 | Payer: MEDICARE

## 2018-08-07 DIAGNOSIS — M5416 Radiculopathy, lumbar region: ICD-10-CM

## 2018-08-07 DIAGNOSIS — I428 Other cardiomyopathies: ICD-10-CM

## 2018-08-07 DIAGNOSIS — E785 Hyperlipidemia, unspecified: Secondary | ICD-10-CM

## 2018-08-07 DIAGNOSIS — K760 Fatty (change of) liver, not elsewhere classified: ICD-10-CM

## 2018-08-07 DIAGNOSIS — I493 Ventricular premature depolarization: ICD-10-CM

## 2018-08-07 DIAGNOSIS — I2699 Other pulmonary embolism without acute cor pulmonale: ICD-10-CM

## 2018-08-07 DIAGNOSIS — G609 Hereditary and idiopathic neuropathy, unspecified: ICD-10-CM

## 2018-08-07 DIAGNOSIS — I2510 Atherosclerotic heart disease of native coronary artery without angina pectoris: ICD-10-CM

## 2018-08-07 DIAGNOSIS — E669 Obesity, unspecified: ICD-10-CM

## 2018-08-07 DIAGNOSIS — R768 Other specified abnormal immunological findings in serum: ICD-10-CM

## 2018-08-07 DIAGNOSIS — M1990 Unspecified osteoarthritis, unspecified site: ICD-10-CM

## 2018-08-07 DIAGNOSIS — I10 Essential (primary) hypertension: ICD-10-CM

## 2018-08-07 DIAGNOSIS — I712 Thoracic aortic aneurysm, without rupture: Principal | ICD-10-CM

## 2018-08-07 DIAGNOSIS — N529 Male erectile dysfunction, unspecified: ICD-10-CM

## 2018-08-07 DIAGNOSIS — I351 Nonrheumatic aortic (valve) insufficiency: ICD-10-CM

## 2018-08-08 DIAGNOSIS — M5416 Radiculopathy, lumbar region: ICD-10-CM

## 2018-08-08 DIAGNOSIS — M5116 Intervertebral disc disorders with radiculopathy, lumbar region: Principal | ICD-10-CM

## 2018-08-08 DIAGNOSIS — G629 Polyneuropathy, unspecified: ICD-10-CM

## 2018-08-12 ENCOUNTER — Encounter: Admit: 2018-08-12 | Discharge: 2018-08-12 | Payer: MEDICARE

## 2018-08-26 ENCOUNTER — Encounter: Admit: 2018-08-26 | Discharge: 2018-08-26 | Payer: MEDICARE

## 2018-09-02 ENCOUNTER — Encounter: Admit: 2018-09-02 | Discharge: 2018-09-02 | Payer: MEDICARE

## 2018-09-02 MED ORDER — SIMVASTATIN 20 MG PO TAB
40 mg | ORAL_TABLET | Freq: Every evening | ORAL | 1 refills | Status: DC
Start: 2018-09-02 — End: 2018-12-16

## 2018-09-03 ENCOUNTER — Encounter: Admit: 2018-09-03 | Discharge: 2018-09-03 | Payer: MEDICARE

## 2018-09-05 ENCOUNTER — Encounter: Admit: 2018-09-05 | Discharge: 2018-09-05 | Payer: MEDICARE

## 2018-09-10 ENCOUNTER — Encounter: Admit: 2018-09-10 | Discharge: 2018-09-10

## 2018-09-10 ENCOUNTER — Ambulatory Visit: Admit: 2018-09-10 | Discharge: 2018-09-11

## 2018-09-10 DIAGNOSIS — M1990 Unspecified osteoarthritis, unspecified site: Secondary | ICD-10-CM

## 2018-09-10 DIAGNOSIS — M47819 Spondylosis without myelopathy or radiculopathy, site unspecified: Secondary | ICD-10-CM

## 2018-09-10 DIAGNOSIS — E785 Hyperlipidemia, unspecified: Secondary | ICD-10-CM

## 2018-09-10 DIAGNOSIS — I2699 Other pulmonary embolism without acute cor pulmonale: Secondary | ICD-10-CM

## 2018-09-10 DIAGNOSIS — I10 Essential (primary) hypertension: Secondary | ICD-10-CM

## 2018-09-10 DIAGNOSIS — I712 Thoracic aortic aneurysm, without rupture: Secondary | ICD-10-CM

## 2018-09-10 DIAGNOSIS — K760 Fatty (change of) liver, not elsewhere classified: Secondary | ICD-10-CM

## 2018-09-10 DIAGNOSIS — I351 Nonrheumatic aortic (valve) insufficiency: Secondary | ICD-10-CM

## 2018-09-10 DIAGNOSIS — I493 Ventricular premature depolarization: Secondary | ICD-10-CM

## 2018-09-10 DIAGNOSIS — M5416 Radiculopathy, lumbar region: Secondary | ICD-10-CM

## 2018-09-10 DIAGNOSIS — R768 Other specified abnormal immunological findings in serum: Secondary | ICD-10-CM

## 2018-09-10 DIAGNOSIS — I2510 Atherosclerotic heart disease of native coronary artery without angina pectoris: Secondary | ICD-10-CM

## 2018-09-10 DIAGNOSIS — I428 Other cardiomyopathies: Secondary | ICD-10-CM

## 2018-09-10 DIAGNOSIS — G609 Hereditary and idiopathic neuropathy, unspecified: Secondary | ICD-10-CM

## 2018-09-10 DIAGNOSIS — Z9689 Presence of other specified functional implants: Secondary | ICD-10-CM

## 2018-09-10 DIAGNOSIS — E669 Obesity, unspecified: Secondary | ICD-10-CM

## 2018-09-10 DIAGNOSIS — N529 Male erectile dysfunction, unspecified: Secondary | ICD-10-CM

## 2018-09-10 NOTE — Progress Notes
SPINE CENTER CLINIC NOTE       SUBJECTIVE:   Patient presents to clinic for follow-up of low back pain and recent onset of LLE numbness.    Patient states he has an appointment with neurology in about 2 weeks, as well as has an EMG scheduled.   ???  Patient reports significant relief, about 70%, after bilateral L5-S1 TFESI performed on 02/03/18. Pain relief lasted 3-4 months, the gradually worsened over time.    ???  Patient continues to reports no relief from [employer] SCS therapy, even after several attempts at reprogramming.   ???  Pain is located across the low back, will radiate to buttock region L>R  Pain is intermittent   Rated as???0-8/10  Described as???ache, shooting, sharp  Numbness/tingling noted in left buttock/LLE and from knees throughout the feet, constant  Worsened by???bending, walking > 20 feet, standing > 5 minutes, getting out of bed  Completely relieved with rest, and partially relieved by???heating pad, rest, prescribed medications  +???muscle stiffness/tightness/spasms/cramping  +balance issues  +???strength loss in bilateral lower extremities    Patient reports currently Lyrica 200mg  BID, Mobic 15mg  daily, and Tizanidine PRN    Patient states he is no longer taking Tramadol because he feels that it didn't provide significant relief or improve level of function.   ???  Hx of spine surgery:???yes - L4-L5 lumbar fusion 02/2008  Denies current use of antibiotics. +Xarelto r/t hx of PE  Denies loss of bowel/bladder function.         Review of Systems   Constitutional: Negative.    HENT: Positive for tinnitus.    Eyes: Negative.    Respiratory: Negative.    Cardiovascular: Positive for leg swelling.   Gastrointestinal: Positive for constipation and rectal pain.   Endocrine: Negative.    Genitourinary: Positive for testicular pain.   Musculoskeletal: Positive for back pain and gait problem.   Skin: Negative.    Allergic/Immunologic: Negative.    Hematological: Negative.    Psychiatric/Behavioral: Negative. Current Outpatient Medications:   ???  carvedilol (COREG) 12.5 mg tablet, Take one tablet by mouth twice daily., Disp: 180 tablet, Rfl: 3  ???  cholecalciferol (VITAMIN D) 1,000 units tablet, Take 1 capsule by mouth daily., Disp: , Rfl:   ???  cholecalciferol (vitamin D3) (D3-50 CHOLECALCIFEROL) 50,000 units capsule, Take one capsule by mouth every 7 days., Disp: 12 capsule, Rfl: 0  ???  fluocinonide (LIDEX) 0.05 % topical cream, Apply to affected area twice a day, Disp: 15 g, Rfl: 0  ???  lisinopriL (ZESTRIL) 10 mg tablet, Take one tablet by mouth daily., Disp: 90 tablet, Rfl: 0  ???  meloxicam (MOBIC) 15 mg tablet, Take one tablet by mouth daily as needed., Disp: 90 tablet, Rfl: 2  ???  metFORMIN (GLUCOPHAGE) 500 mg tablet, Take one tablet by mouth daily., Disp: 90 tablet, Rfl: 1  ???  methylPREDNIsolone (MEDROL DOSEPAK) 4 mg tablet, Take medication as directed on package for 6 days. Take with food., Disp: 21 tablet, Rfl: 0  ???  montelukast (SINGULAIR) 10 mg tablet, Take one tablet by mouth at bedtime daily., Disp: 90 tablet, Rfl: 3  ???  pregabalin (LYRICA) 200 mg capsule, Take one capsule by mouth twice daily., Disp: 180 capsule, Rfl: 0  ???  pregabalin (LYRICA) 225 mg capsule, Take one capsule by mouth twice daily for 30 days., Disp: 60 capsule, Rfl: 3  ???  rivaroxaban (XARELTO) 20 mg tablet, Take one tablet by mouth daily with dinner., Disp: 90 tablet, Rfl:  3  ???  simvastatin (ZOCOR) 20 mg tablet, Take two tablets by mouth at bedtime daily., Disp: 90 tablet, Rfl: 1  ???  spironolactone (ALDACTONE) 25 mg tablet, Take one tablet by mouth daily. Please call to schedule office visit with Dr Iver Nestle, Disp: 90 tablet, Rfl: 3  ???  tiZANidine (ZANAFLEX) 2 mg tablet, Take one tablet to two tablets by mouth three times daily as needed., Disp: 90 tablet, Rfl: 3  ???  traMADol (ULTRAM) 50 mg tablet, Take 50 mg by mouth every 8 hours as needed., Disp: , Rfl:   Allergies   Allergen Reactions   ??? Other [Unclassified Drug] BLISTERS dissolving stiches in mouth after dental surgery caused blisters   ??? Lipitor [Atorvastatin] FLUSHING (SKIN)   ??? Niaspan [Niacin] FLUSHING (SKIN)   ??? Rubber SEE COMMENTS     Neoprene rubber causes contact dermatitis (CAUSED BY THIOUREA IN NEOPRENE)     Physical Exam  Vitals:    09/10/18 1035   BP: 109/52   Pulse: 65   Resp: 20   Temp: 36.4 ???C (97.6 ???F)   TempSrc: Oral   SpO2: 97%   Weight: 131.5 kg (290 lb)   Height: 177.8 cm (70)   PainSc: Zero     Oswestry Total Score:: 58  Pain Score: Zero  Body mass index is 41.61 kg/m???.    General: Alert and oriented, very pleasant male.   HEENT showed extraocular muscles were intact and no other abnormalities.  Unlabored breathing, symmetrical chest expansion.  Regular rate and rhythm on CV exam.   Abdomen soft, round, nontender.  5/5 strength in bilateral upper and lower extremities.    Gait antalgic, use of cane.   Facet loading positive.  Moderate TTP to low lumbar paraspinal muscles with slight increase muscle tone.  Sensation is decreased to light touch in bilateral lower extremities, L>R.  SLR mildly positive bilaterally.          IMPRESSION:  1. Lumbar radiculopathy    2. DDD (degenerative disc disease), lumbosacral    3. Facet arthropathy    4. Spinal cord stimulator status          PLAN:    1. Discussed plan of care options with patient. Discussed risks and benefits of steroid use, patient wishes to continue with plan of care. Will schedule for bilateral L5-S1 TFESI at next available appointment.   2. Encouraged to continue with appointment with neurology, as well as EMG for further evaluation of LLE numbness.   3. Discussed possible pharmacological interventions, will increase Lyrica 225mg  BID.   4. Offered trial Cymbalta 30mg  daily with plans to increase to 60mg  in future for treat neuropathic pain, discussed risks/benefits and possible SEs, patient wishes to discuss options with neurology.   5. Follow-up after procedure.

## 2018-09-11 DIAGNOSIS — M5137 Other intervertebral disc degeneration, lumbosacral region: Secondary | ICD-10-CM

## 2018-09-11 DIAGNOSIS — M5416 Radiculopathy, lumbar region: Secondary | ICD-10-CM

## 2018-09-11 MED ORDER — PREGABALIN 225 MG PO CAP
225 mg | ORAL_CAPSULE | Freq: Two times a day (BID) | ORAL | 3 refills | Status: AC
Start: 2018-09-11 — End: ?

## 2018-09-12 ENCOUNTER — Encounter: Admit: 2018-09-12 | Discharge: 2018-09-12

## 2018-09-12 ENCOUNTER — Ambulatory Visit: Admit: 2018-09-12 | Discharge: 2018-09-12

## 2018-09-12 DIAGNOSIS — N401 Enlarged prostate with lower urinary tract symptoms: Secondary | ICD-10-CM

## 2018-09-12 DIAGNOSIS — I428 Other cardiomyopathies: Secondary | ICD-10-CM

## 2018-09-12 DIAGNOSIS — Z6841 Body Mass Index (BMI) 40.0 and over, adult: Secondary | ICD-10-CM

## 2018-09-12 DIAGNOSIS — M5416 Radiculopathy, lumbar region: Secondary | ICD-10-CM

## 2018-09-12 DIAGNOSIS — Z0000 Encounter for general adult medical examination without abnormal findings: Secondary | ICD-10-CM

## 2018-09-12 DIAGNOSIS — N529 Male erectile dysfunction, unspecified: Secondary | ICD-10-CM

## 2018-09-12 DIAGNOSIS — I10 Essential (primary) hypertension: Secondary | ICD-10-CM

## 2018-09-12 DIAGNOSIS — R609 Edema, unspecified: Secondary | ICD-10-CM

## 2018-09-12 DIAGNOSIS — E669 Obesity, unspecified: Secondary | ICD-10-CM

## 2018-09-12 DIAGNOSIS — I2782 Chronic pulmonary embolism: Secondary | ICD-10-CM

## 2018-09-12 DIAGNOSIS — E785 Hyperlipidemia, unspecified: Secondary | ICD-10-CM

## 2018-09-12 DIAGNOSIS — I351 Nonrheumatic aortic (valve) insufficiency: Secondary | ICD-10-CM

## 2018-09-12 DIAGNOSIS — I712 Thoracic aortic aneurysm, without rupture: Secondary | ICD-10-CM

## 2018-09-12 DIAGNOSIS — R768 Other specified abnormal immunological findings in serum: Secondary | ICD-10-CM

## 2018-09-12 DIAGNOSIS — M1990 Unspecified osteoarthritis, unspecified site: Secondary | ICD-10-CM

## 2018-09-12 DIAGNOSIS — N138 Other obstructive and reflux uropathy: Secondary | ICD-10-CM

## 2018-09-12 DIAGNOSIS — I493 Ventricular premature depolarization: Secondary | ICD-10-CM

## 2018-09-12 DIAGNOSIS — G609 Hereditary and idiopathic neuropathy, unspecified: Secondary | ICD-10-CM

## 2018-09-12 DIAGNOSIS — I2699 Other pulmonary embolism without acute cor pulmonale: Secondary | ICD-10-CM

## 2018-09-12 DIAGNOSIS — I2510 Atherosclerotic heart disease of native coronary artery without angina pectoris: Secondary | ICD-10-CM

## 2018-09-12 DIAGNOSIS — K760 Fatty (change of) liver, not elsewhere classified: Secondary | ICD-10-CM

## 2018-09-12 MED ORDER — FUROSEMIDE 20 MG PO TAB
20 mg | ORAL_TABLET | Freq: Every day | ORAL | 0 refills | 90.00000 days | Status: DC | PRN
Start: 2018-09-12 — End: 2018-10-31

## 2018-09-12 MED ORDER — LISINOPRIL 10 MG PO TAB
10 mg | ORAL_TABLET | Freq: Every day | ORAL | 2 refills | Status: DC
Start: 2018-09-12 — End: 2018-11-11

## 2018-09-12 NOTE — Progress Notes
Date of Service: 09/12/2018    Victor Graham is a 68 y.o. male.  DOB: [birth year]  MRN: [id number]     Subjective:             History of Present Illness  Patient is here for his Health Maintenance Exam.    Patient recently had a right shoulder surgery.  He reports shortly after he was discharged he began noticing bilateral leg swelling.  He denies any change in shortness of breath, cough, fatigue, chest pain, headaches, vision changes.  He reports he is less active than pre-surgery due to pain and being confined to his home because of the current COVID-19 situation.      Hypertension Management:  Medication adherent: most of the time    Treatment goal: 140/90  Outside blood pressures being performed: Yes  BP Readings from Last 3 Encounters:   09/12/18 102/58   09/10/18 109/52   05/23/18 98/63     He denies significant light-headedness.    Hyperlipidemia Management  LDL goal < 100.   Diet adherence:most of the time   Medication adherence:all of the time   Side effects to medications? No  Lab Results   Component Value Date/Time    CHOL 160 05/19/2018 08:14 AM    TRIG 161 (H) 05/19/2018 08:14 AM    HDL 42 05/19/2018 08:14 AM    LDL 95 05/19/2018 08:14 AM    VLDL 32 05/19/2018 08:14 AM    NONHDLCHOL 118 05/19/2018 08:14 AM    ALT 27 05/19/2018 08:14 AM    CK 332 (A) 10/13/2014    CR 0.73 02/18/2018 10:47 AM    K 4.7 02/18/2018 10:47 AM         He has a known history of cardiomyopathy, aortic valve insufficiency and CAD.  He recently was seen by his Cardiologist and cleared for surgery.  He also has a history of asymptomatic PE.  He reports he does not feel his peripheral edema is related to a PE.  He reports since taking pain medications for his surgery he has had an increase in firm stools and increased straining.  He reports no blood in his stools.    He reports 2 skin lesions on his back he would like evaluated.      Medical History:   Diagnosis Date   ??? Arthritis ??? Ascending aortic aneurysm (HCC) 01/08/2008   ??? Asymptomatic PVCs 03/11/2015    03/2015 per pt, has had them for a long time & does not feel them usually    ??? CAD (coronary artery disease)    ??? Dyslipidemia    ??? Erectile dysfunction    ??? Fatty liver    ??? HLD (hyperlipidemia) 01/08/2008   ??? Hypertension 01/08/2008   ??? Idiopathic polyneuropathy    ??? Lumbar radiculopathy    ??? NICM (nonischemic cardiomyopathy) (HCC)    ??? 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 Obesity    ??? Positive ANA (antinuclear antibody) 02/03/2016   ??? Positive ANA (antinuclear antibody) 02/03/2016   ??? Pulmonary embolism [facility]) 2015 -- july 20th    takes xaralto anticoagulant -- treated at [facility]     Surgical History:   Procedure Laterality Date   ??? FASCIOTOMY Right 1992   ??? SPINAL FUSION  01/2008    for low back pain   ??? HERNIA REPAIR     ??? HX JOINT REPLACEMENT     ??? KNEE SURGERY     ???  PR LAPAROSCOPY SURG RPR INITIAL INGUINAL HERNIA       Family History   Problem Relation Age of Onset   ??? DVT Mother    ??? Cancer Father    ??? DVT Brother    ??? Stroke Paternal Grandfather      Social History     Socioeconomic History   ??? Marital status: Married     Spouse name: Not on file   ??? Number of children: Not on file   ??? Years of education: Not on file   ??? Highest education level: Not on file   Occupational History   ??? Not on file   Tobacco Use   ??? Smoking status: Former Smoker     Years: 20.00     Last attempt to quit: 08/24/1991     Years since quitting: 27.0   ??? Smokeless tobacco: Never Used   Substance and Sexual Activity   ??? Alcohol use: No     Alcohol/week: 0.0 standard drinks   ??? Drug use: No   ??? Sexual activity: Not Currently     Partners: Female   Other Topics Concern   ??? 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 Blood Transfusions Not Asked   ??? Caffeine Concern Not Asked   ??? Occupational Exposure Not Asked   ??? Hobby Hazards Not Asked   ??? Sleep Concern Not Asked   ??? Stress Concern Not Asked ??? Weight Concern Not Asked   ??? Special Diet Not Asked   ??? Back Care Not Asked   ??? Exercise Not Asked   ??? Bike Helmet Not Asked   ??? Seat Belt Not Asked   ??? Self-Exams Not Asked   Social History Narrative   ??? Not on file        Review of Systems   Constitutional: Negative for chills, fatigue and fever.   HENT: Negative.    Eyes: Negative.    Respiratory: Negative for cough and shortness of breath.    Cardiovascular: Positive for leg swelling. Negative for chest pain.   Gastrointestinal: Negative.    Endocrine: Negative.    Genitourinary:        H/o BPH   Musculoskeletal: Positive for arthralgias, back pain and gait problem.   Skin:        Skin lesions on back   Allergic/Immunologic: Negative.    Hematological: Negative.    Psychiatric/Behavioral: Negative.  Negative for self-injury, sleep disturbance and suicidal ideas.         Objective:         ??? carvedilol (COREG) 12.5 mg tablet Take one tablet by mouth twice daily.   ??? cholecalciferol (VITAMIN D) 1,000 units tablet Take 1 capsule by mouth daily.   ??? cholecalciferol (vitamin D3) (D3-50 CHOLECALCIFEROL) 50,000 units capsule Take one capsule by mouth every 7 days.   ??? fluocinonide (LIDEX) 0.05 % topical cream Apply to affected area twice a day   ??? furosemide (LASIX) 20 mg tablet Take one tablet by mouth daily as needed.   ??? lisinopriL (ZESTRIL) 10 mg tablet Take one tablet by mouth daily.   ??? meloxicam (MOBIC) 15 mg tablet Take one tablet by mouth daily as needed.   ??? metFORMIN (GLUCOPHAGE) 500 mg tablet Take one tablet by mouth daily.   ??? montelukast (SINGULAIR) 10 mg tablet Take one tablet by mouth at bedtime daily.   ??? oxyCODONE (ROXICODONE) 5 mg tablet    ??? pregabalin (LYRICA) 200  mg capsule Take one capsule by mouth twice daily.   ??? pregabalin (LYRICA) 225 mg capsule Take one capsule by mouth twice daily for 30 days.   ??? rivaroxaban (XARELTO) 20 mg tablet Take one tablet by mouth daily with dinner. ??? simvastatin (ZOCOR) 20 mg tablet Take two tablets by mouth at bedtime daily.   ??? spironolactone (ALDACTONE) 25 mg tablet Take one tablet by mouth daily. Please call to schedule office visit with Dr Iver Nestle   ??? tiZANidine (ZANAFLEX) 2 mg tablet Take one tablet to two tablets by mouth three times daily as needed.   ??? traMADol (ULTRAM) 50 mg tablet Take 50 mg by mouth every 8 hours as needed.     Vitals:    09/12/18 1302   BP: 102/58   Pulse: 74   Temp: 36.4 ???C (97.6 ???F)   TempSrc: Oral   SpO2: 95%   Weight: 133.8 kg (295 lb)   Height: 177.8 cm (70)   PainSc: One     Body mass index is 42.33 kg/m???.     Physical Exam  Vitals signs reviewed.   Constitutional:       Appearance: Normal appearance. He is well-developed.   HENT:      Head: Normocephalic and atraumatic.      Right Ear: Tympanic membrane, ear canal and external ear normal.      Left Ear: Tympanic membrane, ear canal and external ear normal.      Nose: Nose normal.      Mouth/Throat:      Mouth: Mucous membranes are moist.   Eyes:      Conjunctiva/sclera: Conjunctivae normal.      Pupils: Pupils are equal, round, and reactive to light.   Neck:      Musculoskeletal: Neck supple. No muscular tenderness.      Thyroid: No thyromegaly.      Vascular: No JVD.   Cardiovascular:      Rate and Rhythm: Normal rate and regular rhythm.      Pulses: Normal pulses.      Heart sounds: No friction rub. No gallop.    Pulmonary:      Effort: Pulmonary effort is normal. No respiratory distress.      Breath sounds: Normal breath sounds. No wheezing or rales.   Abdominal:      General: Bowel sounds are normal. There is no distension.      Palpations: Abdomen is soft. There is no mass.      Tenderness: There is no abdominal tenderness. There is no guarding or rebound.   Musculoskeletal:      Comments: 2+ pitting edema of his bilateral lower extremities to his knees.  Weeping of clear fluid bilaterally (R>L), pulses palpable, no erythema, no calf tenderness, - Homans sign. Lymphadenopathy:      Cervical: No cervical adenopathy.   Skin:     General: Skin is warm.      Capillary Refill: Capillary refill takes less than 2 seconds.      Findings: No rash.      Comments: 2 fairly large, thick, dark, waxy, stuck on lesions on his back   Neurological:      Mental Status: He is alert.      Coordination: Coordination normal.      Gait: Gait abnormal.   Psychiatric:         Mood and Affect: Mood normal.         Behavior: Behavior normal.  Thought Content: Thought content normal.         Judgment: Judgment normal.              Assessment and Plan:  Health Maintenance Exam  - fasting labs ordered  - counseled the patient on importance of maintaining a healthy diet and regular exercise  - reviewed immunizations.    - CBC AND DIFF; Future  - COMPREHENSIVE METABOLIC PANEL; Future  - LIPID PROFILE; Future  - MICROALB/CR RATIO-URINE RANDOM; Future  - HEMOGLOBIN A1C; Future  - PROSTATIC SPECIFIC ANTIGEN-PSA; Future    2. Essential hypertension  Hypertension    Plan:   Discussed hypertension and reviewed goals.  Are barriers to achieving goals present? No  Medication education provided. Patient voiced understanding? Yes  Patient able to self-manage and ready to comply? Yes  Educational resources identified? Verbal Counseling    3. Hyperlipidemia, unspecified hyperlipidemia type  Hyperlipidemia    Plan:  Discussed labs and reviewed goals for LDL, HDL, triglycerides.   Discussed exercise management and diet with emphasis on vegetables, fruit and lean meat.  Are barriers to achieving goals present? No  Medication education provided. Patient voiced understanding? Yes  Patient able to self-manage and ready to comply?Yes  Educational resources identified? Verbal Counseling       4. Cardiomyopathy, idiopathic (HCC)  5. Other chronic pulmonary embolism without acute cor pulmonale (HCC)  6. Nonrheumatic aortic valve insufficiency  7. Peripheral edema - I have reviewed the patient's medical history in detail and updated the computerized patient record.  - currently appears stable with respect to cardiopulmonary status.  I suspect his increased edema is due in part to inactivity.  I had a lengthy discussion with the patient regarding signs/symptoms to monitor for, including but not limited to cough, sob, fatigue, chest pain, headaches, vision changes.  - we will give a short trial of lasix and have encouraged him to increase activity.  When he is able to, he should wear his compression garments as well.  - furosemide (LASIX) 20 mg tablet; Take one tablet by mouth daily as needed.  Dispense: 45 tablet; Refill: 0    8. BPH with obstruction/lower urinary tract symptoms  - I have reviewed the patient's medical history in detail and updated the computerized patient record.  - currently stable    9. Lumbar radiculopathy  - I have reviewed the patient's medical history in detail and updated the computerized patient record.  -  I discussed with patient conservative measures including ice, rest, appropriate medications, activity modification.       10. Class 3 severe obesity due to excess calories with serious comorbidity and body mass index (BMI) of 40.0 to 44.9 in adult [facility])  - I have reviewed the patient's medical history in detail and updated the computerized patient record.  Discussed general issues about obesity pathophysiology and management.  Discussed exercise management and diet with emphasis on vegetables, fruit and lean meat.  Are barriers to achieving goals present? Yes post-surgical restrictions  Patient able to self-manage and ready to comply? Yes  Educational resources identified? Verbal Counseling               Health Risk Assessment Questionnaire  Current Care  List of Providers you have seen in the last two years: Dr. Jomarie Longs Jarrod Mcenery;Dr. Daleen Bo Bhagat;Dr. Carollee Herter Carpenter;Dr. Catha Brow Katie Fry;Dr. Casilda Carls. Lucious Groves, DDS Are you receiving home health?: No  During the past 4 weeks, how would you rate your health in general?: Marland Kitchen)  Fair    Outside Care  Since your last PCP visit, have you received care outside of [facility]?: (!) Yes  What type of care did you receive outside of [facility]? (select all that apply): (!) Hospitalization  What is the Facility where you received care and the provider's name?: [facility];Surgery - Reverse Total Shoulder Replacement, Dr. 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 Milana Na    Physical Activity  Do you exercise or are you physically active?: (!) No          Diet  In the past month, were you worried whether your food would run out before you or your family had money to buy more?: No  In the past 7 days, how many times did you eat fast food or junk food or pizza?: (!) 2  In the past 7 days, how many servings of fruits or vegetables did you eat each day?: (!) 2-3  In the past 7 days, how many sodas and sugar sweetened drinks (regular, not diet) did you drink each day?: 0    Smoke/Tobacco Use  Are you currently a smoker?: No      Alcohol Use  Do you drink alcohol?: No          Depression Screen  Little interest or pleasure in doing things: (!) Several Days  Feeling down, depressed or hopeless: Not at All    Pain  How would you rate your pain today?: (!) Moderate pain    Ambulation  Do you use any assistive devices for ambulation?: (!) Yes  What types of device? (select all that apply): Ephraim Hamburger    Fall Risk  Does it take you longer than 30 seconds to get up and out of a chair?: No  Have you fallen in the past year?: (!) Yes  Fall History (last 60mo): (!) One Fall without Major Injury    Motor Vehicle Safety  Do you fasten your seat belt when you are in the car?: Yes    Sun Exposure  Do you protect yourself from the sun? For example, wear sunscreen when outside.: (!) No    Hearing Loss Do you have trouble hearing the television or radio when others do not?: No  Do you have to strain or struggle to hear/understand conversation?: No  Do you use hearing aids?: No    Cognitive Impairment  During the past 12 months, have you experienced confusion or memory loss that is happening more often or is getting worse?: No    Functional Screen  Do you live alone?: No  Do you live at: Home  Can you drive your own car or travel alone by bus or taxi?: Yes  Can you shop for groceries or clothes without help?: Yes  Can you prepare your own meals?: Yes  Can you do your own housework without help?: Yes  Can you handle your own money without help?: Yes  Do you need help eating, bathing, dressing, or getting around your home?: No  Do you feel safe?: Yes  Does anyone at home hurt you, hit you, or threaten you?: No  Have you ever been the victim of abuse?: No    Home Safety  Does your home have grab bars in the bathroom?: Yes  Does your home have hand rails on stairs and steps?: Yes  Does your home have functioning smoke alarms?: Yes    Advance  Directive  Do you have a living will or Advance Directive?: Yes      Dental Screen  Have you had an exam by your dentist in the last year?: Yes    Vision Screen  Do you have diabetes?: No

## 2018-09-12 NOTE — Telephone Encounter
Pt called triage line requesting refill for lisinopril, sent as requested.

## 2018-09-13 LAB — LIPID PROFILE
Lab: 179 mg/dL (ref <200)
Lab: 201 mg/dL — ABNORMAL HIGH (ref <150)

## 2018-09-13 LAB — CBC AND DIFF
Lab: 0 10*3/uL (ref 0–0.20)
Lab: 0.2 10*3/uL (ref 0–0.45)
Lab: 0.6 10*3/uL (ref 0–0.80)
Lab: 1 % (ref >60)
Lab: 1.7 10*3/uL (ref 1.0–4.8)
Lab: 12 % — ABNORMAL HIGH (ref 4–12)
Lab: 14 g/dL (ref 13.5–16.5)
Lab: 16 % — ABNORMAL HIGH (ref 11–15)
Lab: 166 10*3/uL (ref 150–400)
Lab: 2.4 10*3/uL (ref 1.8–7.0)
Lab: 29 pg (ref 26–34)
Lab: 32 g/dL (ref 32.0–36.0)
Lab: 35 % (ref 24–44)
Lab: 4 % (ref >60)
Lab: 4.8 M/UL (ref 4.4–5.5)
Lab: 43 % (ref 40–50)
Lab: 48 % (ref 41–77)
Lab: 5.1 K/UL (ref 4.5–11.0)
Lab: 88 FL (ref 80–100)
Lab: 9.5 FL (ref 7–11)

## 2018-09-13 LAB — HEMOGLOBIN A1C: Lab: 6.3 % — ABNORMAL HIGH (ref 4.0–6.0)

## 2018-09-13 LAB — COMPREHENSIVE METABOLIC PANEL
Lab: 139 MMOL/L (ref >40)
Lab: 4.4 MMOL/L — ABNORMAL HIGH (ref <100)

## 2018-09-13 LAB — PROSTATIC SPECIFIC ANTIGEN-PSA: Lab: 1.6 ng/mL (ref <4.01)

## 2018-09-13 LAB — MICROALB/CR RATIO-URINE RANDOM: Lab: 56 mg/dL (ref 41–77)

## 2018-09-15 ENCOUNTER — Encounter: Admit: 2018-09-15 | Discharge: 2018-09-15

## 2018-09-15 DIAGNOSIS — R2243 Localized swelling, mass and lump, lower limb, bilateral: Secondary | ICD-10-CM

## 2018-09-15 DIAGNOSIS — M79661 Pain in right lower leg: Secondary | ICD-10-CM

## 2018-09-16 NOTE — Telephone Encounter
Lets order bilateral LE US venous doppler to R/O DVT.  He has a history of PE but is asymptomatic.  He can take 2 of the lasix for the next 3 days as well.

## 2018-09-16 NOTE — Progress Notes
Please notify patient that his triglycerides are up and I'd like him to refocus on diet and exercise when he is able to.  Otherwise his labs overall look good.

## 2018-09-16 NOTE — Telephone Encounter
Completed, spoke with patient regarding orders and recommendations. Patient verbalizes appreciation and understanding.

## 2018-09-23 ENCOUNTER — Encounter: Admit: 2018-09-23 | Discharge: 2018-09-23

## 2018-09-23 ENCOUNTER — Ambulatory Visit: Admit: 2018-09-23 | Discharge: 2018-09-23

## 2018-09-23 DIAGNOSIS — M79661 Pain in right lower leg: Secondary | ICD-10-CM

## 2018-09-23 DIAGNOSIS — R2243 Localized swelling, mass and lump, lower limb, bilateral: Secondary | ICD-10-CM

## 2018-09-23 DIAGNOSIS — M79662 Pain in left lower leg: Secondary | ICD-10-CM

## 2018-09-25 ENCOUNTER — Encounter: Admit: 2018-09-25 | Discharge: 2018-09-25

## 2018-09-25 DIAGNOSIS — I739 Peripheral vascular disease, unspecified: Secondary | ICD-10-CM

## 2018-09-25 DIAGNOSIS — R600 Localized edema: Secondary | ICD-10-CM

## 2018-09-25 NOTE — Progress Notes
Please notify patient that his [place] is without evidence of clots.  Let's refer him to vascular surgery for peripheral vascular disease and edema.

## 2018-10-01 ENCOUNTER — Encounter: Admit: 2018-10-01 | Discharge: 2018-10-01

## 2018-10-02 ENCOUNTER — Encounter: Admit: 2018-10-02 | Discharge: 2018-10-02

## 2018-10-02 ENCOUNTER — Ambulatory Visit: Admit: 2018-10-02 | Discharge: 2018-10-02

## 2018-10-02 DIAGNOSIS — K760 Fatty (change of) liver, not elsewhere classified: Secondary | ICD-10-CM

## 2018-10-02 DIAGNOSIS — G609 Hereditary and idiopathic neuropathy, unspecified: Secondary | ICD-10-CM

## 2018-10-02 DIAGNOSIS — M1990 Unspecified osteoarthritis, unspecified site: Secondary | ICD-10-CM

## 2018-10-02 DIAGNOSIS — E785 Hyperlipidemia, unspecified: Secondary | ICD-10-CM

## 2018-10-02 DIAGNOSIS — M5416 Radiculopathy, lumbar region: Secondary | ICD-10-CM

## 2018-10-02 DIAGNOSIS — I10 Essential (primary) hypertension: Secondary | ICD-10-CM

## 2018-10-02 DIAGNOSIS — G5603 Carpal tunnel syndrome, bilateral upper limbs: Secondary | ICD-10-CM

## 2018-10-02 DIAGNOSIS — R768 Other specified abnormal immunological findings in serum: Secondary | ICD-10-CM

## 2018-10-02 DIAGNOSIS — E1142 Type 2 diabetes mellitus with diabetic polyneuropathy: Secondary | ICD-10-CM

## 2018-10-02 DIAGNOSIS — I2699 Other pulmonary embolism without acute cor pulmonale: Secondary | ICD-10-CM

## 2018-10-02 DIAGNOSIS — N529 Male erectile dysfunction, unspecified: Secondary | ICD-10-CM

## 2018-10-02 DIAGNOSIS — G5623 Lesion of ulnar nerve, bilateral upper limbs: Secondary | ICD-10-CM

## 2018-10-02 DIAGNOSIS — G5621 Lesion of ulnar nerve, right upper limb: Secondary | ICD-10-CM

## 2018-10-02 DIAGNOSIS — I351 Nonrheumatic aortic (valve) insufficiency: Secondary | ICD-10-CM

## 2018-10-02 DIAGNOSIS — I428 Other cardiomyopathies: Secondary | ICD-10-CM

## 2018-10-02 DIAGNOSIS — I712 Thoracic aortic aneurysm, without rupture: Secondary | ICD-10-CM

## 2018-10-02 DIAGNOSIS — M5116 Intervertebral disc disorders with radiculopathy, lumbar region: Secondary | ICD-10-CM

## 2018-10-02 DIAGNOSIS — I2510 Atherosclerotic heart disease of native coronary artery without angina pectoris: Secondary | ICD-10-CM

## 2018-10-02 DIAGNOSIS — E669 Obesity, unspecified: Secondary | ICD-10-CM

## 2018-10-02 DIAGNOSIS — I493 Ventricular premature depolarization: Secondary | ICD-10-CM

## 2018-10-02 NOTE — Progress Notes
EMG/NCS    Vitals Stable  Procedure Time Out Check List:  Prior to the start of the procedure, I personally confirmed the following:      Patient Identity (name & date of birth): Yes  Procedure: Yes  Site: Yes  Body Part: Yes    The risks of the procedure, including infection, bleeding, and pain, were discussed with the patient.    Preprocedure pain score was 1 and postop pain score was 0 .    Ollen Barges, MD  Associate Professor  Department of Neurology

## 2018-10-02 NOTE — Progress Notes
Date of Service: 10/02/2018    Subjective:             Victor Graham is a 68 y.o. male.    History of Present Illness   68 year old right-handed man with history of L4-L5 surgery back in 2009, now referred for evaluation of lumbosacral radiculopathy and neuropathy.    He reports that after his L-spine surgery 11 years ago, he had a bandlike sensation around the base of his left big toe.  Then he started to have numbness in his feet bilaterally gradually ascending up to his mid shins, especially noticeable over the past 2 to 3 years.    About 2 to 3 months ago as he was bending over in his garden, when he straightened up he experienced a sudden sharp shooting pain from his low back into his left gluteal and posterior thigh regions.  The pain lasted for a few hours followed by development of numbness in the same area as well as lateral and distal left thigh.  This has persisted ever since.  He reports using a walker to ambulate around the house as well as coasting using furniture, if he were not to have support he would stumble around veering to the left more than right.    For the past few years he is also noted numbness in both hands, especially when he is at using them for example while playing on his iPad.  This numbness is predominantly over his thumbs, left worse than right.  He does not endorse any neck pain radiating into his upper extremities.  No grip weakness or problems with dexterity.  No proximal upper extremity weakness symptoms endorsed.    No bowel or bladder dysfunction endorsed.  No symptoms specifically suggestive of distal lower extremity weakness.    He has a spinal cord stimulator in place and has not been able to get MRIs as a result, but reports he is in the process of trying to get it removed since it does not seem to be helping.  He also got bilateral L5 and S1 epidural steroid injections in October 2019 with 70% pain relief at the spine center here at [facility], and plans to get reinjected in early July 2020.    Medical History:   Diagnosis Date   ??? Arthritis    ??? Ascending aortic aneurysm (HCC) 01/08/2008   ??? Asymptomatic PVCs 03/11/2015    03/2015 per pt, has had them for a long time & does not feel them usually    ??? CAD (coronary artery disease)    ??? Dyslipidemia    ??? Erectile dysfunction    ??? Fatty liver    ??? HLD (hyperlipidemia) 01/08/2008   ??? Hypertension 01/08/2008   ??? Idiopathic polyneuropathy    ??? Lumbar radiculopathy    ??? NICM (nonischemic cardiomyopathy) (HCC)    ??? 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 Obesity    ??? Positive ANA (antinuclear antibody) 02/03/2016   ??? Positive ANA (antinuclear antibody) 02/03/2016   ??? Pulmonary embolism [facility]) 2015 -- july 20th    takes xaralto anticoagulant -- treated at [facility]     Surgical History:   Procedure Laterality Date   ??? FASCIOTOMY Right 1992   ??? KNEE SURGERY Right 02/2007    Torn meniscus removed   ??? HX LUMBAR FUSION  02/2008    L4-5   ??? AAA REPAIR  06/2008   ??? HX JOINT REPLACEMENT  Right 06/2010   ??? HERNIA REPAIR  01/2013    x2   ??? KNEE REPLACEMENT Left 03/2013   ??? CYST REMOVAL Left 06/2013    wrist   ??? STIMULATOR IMPLANT  12/2014    Spinal stimulator [employer]   ??? CYST REMOVAL Right 08/2017    wrist   ??? SHOULDER SURGERY  08/2018    reversed arthroplasty   ??? PR LAPAROSCOPY SURG RPR INITIAL INGUINAL HERNIA       Family History   Problem Relation Age of Onset   ??? DVT Mother    ??? Cancer Father    ??? DVT Brother    ??? Stroke Paternal Grandfather    ??? Cancer Paternal Grandmother      Social History     Socioeconomic History   ??? Marital status: Married     Spouse name: Not on file   ??? Number of children: Not on file   ??? Years of education: Not on file   ??? Highest education level: Not on file   Occupational History   ??? Not on file   Tobacco Use   ??? Smoking status: Former Smoker     Years: 20.00     Last attempt to quit: 08/24/1991 Years since quitting: 27.1   ??? Smokeless tobacco: Never Used   Substance and Sexual Activity   ??? Alcohol use: No     Alcohol/week: 0.0 standard drinks   ??? Drug use: No   ??? Sexual activity: Not Currently     Partners: Female   Other Topics Concern   ??? 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 Blood Transfusions Not Asked   ??? Caffeine Concern Not Asked   ??? Occupational Exposure Not Asked   ??? Hobby Hazards Not Asked   ??? Sleep Concern Not Asked   ??? Stress Concern Not Asked   ??? Weight Concern Not Asked   ??? Special Diet Not Asked   ??? Back Care Not Asked   ??? Exercise Not Asked   ??? Bike Helmet Not Asked   ??? Seat Belt Not Asked   ??? Self-Exams Not Asked   Social History Narrative   ??? Not on file        Review of Systems   Constitutional: Positive for activity change.   HENT: Positive for postnasal drip, rhinorrhea, tinnitus and voice change.    Cardiovascular: Positive for leg swelling.   Genitourinary: Positive for frequency, scrotal swelling and testicular pain.   Musculoskeletal: Positive for back pain and gait problem.   Neurological: Positive for numbness.   All other systems reviewed and are negative.        Objective:         ??? carvedilol (COREG) 12.5 mg tablet Take one tablet by mouth twice daily.   ??? cholecalciferol (VITAMIN D) 1,000 units tablet Take 1 capsule by mouth daily.   ??? fluocinonide (LIDEX) 0.05 % topical cream Apply to affected area twice a day   ??? furosemide (LASIX) 20 mg tablet Take one tablet by mouth daily as needed.   ??? GLUCOSAMINE HCL PO Take 1,000 mg by mouth twice daily.   ??? lisinopriL (ZESTRIL) 10 mg tablet Take one tablet by mouth daily.   ??? meloxicam (MOBIC) 15 mg tablet Take one tablet by mouth daily as needed.   ??? metFORMIN (GLUCOPHAGE) 500 mg tablet Take one tablet by mouth daily.   ??? montelukast (SINGULAIR) 10 mg tablet Take  one tablet by mouth at bedtime daily.   ??? pregabalin (LYRICA) 200 mg capsule Take one capsule by mouth twice daily. (Patient taking differently: Take 225 mg by mouth twice daily.)   ??? pregabalin (LYRICA) 225 mg capsule Take one capsule by mouth twice daily for 30 days.   ??? rivaroxaban (XARELTO) 20 mg tablet Take one tablet by mouth daily with dinner.   ??? simvastatin (ZOCOR) 20 mg tablet Take two tablets by mouth at bedtime daily.   ??? spironolactone (ALDACTONE) 25 mg tablet Take one tablet by mouth daily. Please call to schedule office visit with Dr Iver Nestle   ??? tiZANidine (ZANAFLEX) 2 mg tablet Take one tablet to two tablets by mouth three times daily as needed.   ??? traMADol (ULTRAM) 50 mg tablet Take 50 mg by mouth every 8 hours as needed.     Vitals:    10/02/18 0958   BP: 113/64   BP Source: Arm, Left Upper   Patient Position: Sitting   Pulse: 72   Resp: 16   Weight: (!) 136.5 kg (301 lb)   Height: 177.8 cm (70)   PainSc: Zero     Body mass index is 42.33 kg/m???.     Physical Exam   General physical exam:  General: Alert, cooperative, no distress, appears stated age   HEENT: Normocephalic, eyes open with no discharge, nares patent, oropharynx is clear with no lesions, palate intact  CV: Regular rate and rhythm, no murmur, distal pulses palpable  Chest: lungs are clear bilaterally  Ab:  soft, non-tender, non-distended, no masses, no organomegaly   Extremities: Normal, atraumatic, no cyanosis, clubbing or edema   Skin: No rashes or lesions     Neurological examination:   Mental Status: Alert, oriented to time, place and person   CN II thru XII: Pupils 3mm, reactive to light and accommodation, intact extraocular movements, intact facial sensation,  intact hearing, symmetric palatal elevation, able to shrug shoulders equally on both sides, tongue midline.    Palpebral Fissure 9/32mm   Orb Oculi 5/5   Orb Oris 5/5   Tongue  5/5     Speech: No Dysarthria  Motor Exam:    Neck flexors: 5   Neck extensors: 5      Right Left  Right Left   Shoulder abductors: 5 5 Hip flexion: 5 5   Elbow flexors: 5 5 Hip abduction: 5 5 Elbow extensors: 5 5 Hip Adduction: 5 5   Wrist extensors: 5 5 Knee extension: 5 5   Wrist flexors: 5 5 Knee flexion: 5 5   Finger flexors: 5 5 Ankle dorsiflexion: 4 4+   Finger extensors: 5 5 Ankle plantar flexion: 4 5   Finger abductors: 4+ FDI 5, ADM 4+ Ankle eversion: 5- 5   Thumb abductors: 4+ 4+ Ankle inversion: 5 5      Toe extension: 4- 3-      Toe flexion: 3 3     No high arches or hammer toes  Tinel sign positive over left median nerve over the wrist and right ulnar nerve at the elbow.  Progression of left ulnar nerve at the elbow produced paresthesias in the left third digit atypically.    Sensory: Pinprick diminished up to mid shins bilaterally with relatively greater diminution along the lateral foot and distal foreleg left worse than right.  Patchy loss of vibration in the feet with some asymmetric, but proprioception intact.    Coordination: Normal finger to nose bilaterally.  Reflexes:  Reflex   Right   Left     Brachioradialis   2 2   Biceps    2 2   Triceps 2 2   Knee 0 0   Ankle   1 w/ Jendrassik 0   Jaw Jerk       Right   Left     Plantar response down down   Hoffman???s  absent absent     Gait and Station: wide-based antalgic gait. Did not attempt heel walk, toe walk or tandem.     MRI in 2015:  MRI L-SPINE W/O CONTRAST ??? ??? ??? ???IMPRESSION:   1. AT L1-L2 COMBINATION OF DEGENERATIVE CHANGES RESULTS IN MODERATE   CENTRAL STENOSIS.   2. AT L2-L3 A COMBINATION OF DEGENERATIVE CHANGES RESULTS IN MODERATE TO   SEVERE CENTRAL STENOSIS, MODERATE BILATERAL FORAMINAL STENOSIS.   3. AT L3-L4 COMBINATION OF DEGENERATIVE CHANGES RESULTS IN SEVERE RIGHT   LATERAL AND RIGHT FORAMINAL STENOSIS (FROM AND INTRAFORAMINAL DISC   HERNIATION), MODERATE TO SEVERE CENTRAL STENOSIS, AND MODERATE LEFT   FORAMINAL STENOSIS.   4. AT L4-L5 AND L5-S1 DISC BULGES RESULT IN MODERATE LEFT FORAMINAL   STENOSIS.   5. 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 Electronically signed on: Aug 25 2013 12:20PM by Hessie Diener REEVES   CT L spine:  IMPRESSION    1. ???Intact posterior instrumentation and solid posterior lateral fusion at   L4-L5.  2. ???Progression of severe disc degeneration at L2-L3 and L3-L4. Moderate   L1-L2 and L5-S1 disc degeneration.  3. ???Persistent grade 1 anterolisthesis of L4 on L5.  4. ???Moderate left lateral subluxation of L3 on L4. Mild-to-moderate left   convexity lumbar scoliosis.  5. ???Marked right L2-L3 and right L3-L4 neural foraminal stenosis. Moderate   to marked left L2-L3 neural foraminal stenosis. Moderate left L3-L4, right   and left L4-L5 and right and left L5-S1 neural foraminal stenosis.  6. ???There are 2 spine stimulator leads entering the dorsal epidural space   at the lower thoracic spine extending cephalad.  7. ???Moderate fatty atrophy of the right psoas muscle.    ???Finalized by Yvonna Alanis, M.D. on 01/22/2018 10:48 AM. Dictated by Yvonna Alanis, M.D. on 01/22/2018 10:34 AM.     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      -EMG/NCS LUE/LLE with right median/ulnar NCS.  -Neuropathy labs: B12, MMA, S IFE, copper, ESR, CBC, CMP  -Continue follow-up with spine center and pain management  -Follow-up in 6 months  Control blood sugars  Recommend weight loss    Patient seen and discussed with attending, Dr. Lenn Cal, who agrees with the plan of care unless amended below or elsewhere.    Leatrice Jewels, MD  Neuromuscular fellow    I personally performed the key portions of the E/M visit, discussed case with Dr Freda Jackson and concur with Dr Guss Bunde documentation of history, physical exam, assessment, and treatment plan.Here is my  Brief summary Mr Clendenning is a 68 year old male who presents for evaluation of neuropathy and radiculopathy. He reports of having long standing history of low back pain for many years and he had  L4-5 spine surgery in 2009. Following surgery, he started having band like sensations around base of big  toe, gradually went up, bilaterally numbness up to mid shins and more laterally. Bending down and standing up, he experiences sharp pain in the back. Numbness started around the same tiime. He also had lateral thigh numbness on the same side and whole left leg similar symptoms. Usually more comfortable in recliner or using cart. Playing games with ipad or at night caused numbness in the thumbs, no grip weakness, no neck pain or cervical radicular symptoms. No bowel or bladder issues.   He was followed by Dr Sunday Corn for some time. He had spinal cord stimulator placed and cannot get MRI. CT spine showed multilevel degenerative disease with moderate to severe. Epidural injections helped with relief of back pain, no changes in progression.  For past few years he is also noticing numbness in the hands, predominantly over the thumbs. No proximal upper extremity weakness symptoms.   On examination He has mainly distal weakness in the upper and lower extremities, FABD, TABD 4, lower extremity ADF, APF. TE and TF weakness, Decreased sensation in the pin prick sensation up to mid shins and absent vibration at the toes, some patchy loss. + tinel in left hand at the wrist and at the elbow. Reflexes absent at the ankles and kness, normal in upper extremity.     A/p:  Mr Castorena has symptoms suggestive of all the following diagnosis  1) Lumbar stenosis and radiculopathy  2) Bilateral carpal tunnel syndrome  3) Diabetic distal sensorimotor polyneuropathy  4) Left ulnar neuropathy    We discussed in detail about the various diagnosis and probably all of these are contributing to his symptoms and we discussed the management of this. He reports of worsening symptoms and was wondering of any treatment options for this.  Will  perform EMG/NCS today, Neuropathy labs, continue pain management with pain clinic, followup in 6months, control blood sugars and weight loss. We will see him back in clinic in 6months, earlier if symptoms progress    Bing Quarry, MD  Associate Professor  Department of Neurology                Time spent with the patient was 25 minutes, 15 minutes of which were towards counseling regarding diagnosis, prognosis and management of neuropathy, radiculopathy

## 2018-10-03 ENCOUNTER — Encounter: Admit: 2018-10-03 | Discharge: 2018-10-03

## 2018-10-03 ENCOUNTER — Ambulatory Visit: Admit: 2018-10-02 | Discharge: 2018-10-03

## 2018-10-03 DIAGNOSIS — G629 Polyneuropathy, unspecified: Secondary | ICD-10-CM

## 2018-10-03 DIAGNOSIS — G6289 Other specified polyneuropathies: Secondary | ICD-10-CM

## 2018-10-03 DIAGNOSIS — E1142 Type 2 diabetes mellitus with diabetic polyneuropathy: Secondary | ICD-10-CM

## 2018-10-03 DIAGNOSIS — G5623 Lesion of ulnar nerve, bilateral upper limbs: Secondary | ICD-10-CM

## 2018-10-03 DIAGNOSIS — M5116 Intervertebral disc disorders with radiculopathy, lumbar region: Secondary | ICD-10-CM

## 2018-10-03 DIAGNOSIS — R799 Abnormal finding of blood chemistry, unspecified: Secondary | ICD-10-CM

## 2018-10-03 DIAGNOSIS — G5621 Lesion of ulnar nerve, right upper limb: Secondary | ICD-10-CM

## 2018-10-03 LAB — COMPREHENSIVE METABOLIC PANEL
Lab: 1 mg/dL (ref 0.3–1.2)
Lab: 139 MMOL/L (ref 137–147)
Lab: 24 MMOL/L (ref 21–30)
Lab: 39 U/L (ref 7–40)
Lab: 4.1 g/dL (ref 3.5–5.0)
Lab: 40 U/L (ref 25–110)
Lab: 7 g/dL (ref 6.0–8.0)

## 2018-10-03 LAB — CBC
Lab: 4.9 M/UL (ref 4.4–5.5)
Lab: 5.5 K/UL (ref 4.5–11.0)

## 2018-10-03 LAB — HEMOGLOBIN A1C: Lab: 6.3 % — ABNORMAL HIGH (ref 4.0–6.0)

## 2018-10-03 LAB — IMMUNOFIXATION, SERUM (IFES)

## 2018-10-03 LAB — SED RATE: Lab: 20 mm/h (ref 0–20)

## 2018-10-03 LAB — VITAMIN B12: Lab: 347 pg/mL (ref 180–914)

## 2018-10-06 LAB — COPPER: Lab: 0.8 MMOL/L (ref 98–110)

## 2018-10-07 ENCOUNTER — Encounter: Admit: 2018-10-07 | Discharge: 2018-10-07

## 2018-10-07 LAB — METHYLMALONIC ACID QUANT: Lab: 0.1 MMOL/L (ref 3.5–5.1)

## 2018-10-07 NOTE — Telephone Encounter
Pt didn't stop xarelto. Transferred to schedulers to reschedule.

## 2018-10-14 ENCOUNTER — Encounter: Admit: 2018-10-14 | Discharge: 2018-10-14

## 2018-10-14 ENCOUNTER — Ambulatory Visit: Admit: 2018-10-14 | Discharge: 2018-10-14

## 2018-10-14 DIAGNOSIS — K760 Fatty (change of) liver, not elsewhere classified: Secondary | ICD-10-CM

## 2018-10-14 DIAGNOSIS — R52 Pain, unspecified: Principal | ICD-10-CM

## 2018-10-14 DIAGNOSIS — I2699 Other pulmonary embolism without acute cor pulmonale: Secondary | ICD-10-CM

## 2018-10-14 DIAGNOSIS — N529 Male erectile dysfunction, unspecified: Secondary | ICD-10-CM

## 2018-10-14 DIAGNOSIS — I351 Nonrheumatic aortic (valve) insufficiency: Secondary | ICD-10-CM

## 2018-10-14 DIAGNOSIS — M1990 Unspecified osteoarthritis, unspecified site: Secondary | ICD-10-CM

## 2018-10-14 DIAGNOSIS — I493 Ventricular premature depolarization: Secondary | ICD-10-CM

## 2018-10-14 DIAGNOSIS — E785 Hyperlipidemia, unspecified: Secondary | ICD-10-CM

## 2018-10-14 DIAGNOSIS — M5116 Intervertebral disc disorders with radiculopathy, lumbar region: Secondary | ICD-10-CM

## 2018-10-14 DIAGNOSIS — R768 Other specified abnormal immunological findings in serum: Secondary | ICD-10-CM

## 2018-10-14 DIAGNOSIS — I10 Essential (primary) hypertension: Secondary | ICD-10-CM

## 2018-10-14 DIAGNOSIS — M5416 Radiculopathy, lumbar region: Secondary | ICD-10-CM

## 2018-10-14 DIAGNOSIS — G609 Hereditary and idiopathic neuropathy, unspecified: Secondary | ICD-10-CM

## 2018-10-14 DIAGNOSIS — I428 Other cardiomyopathies: Secondary | ICD-10-CM

## 2018-10-14 DIAGNOSIS — I712 Thoracic aortic aneurysm, without rupture: Secondary | ICD-10-CM

## 2018-10-14 DIAGNOSIS — I2510 Atherosclerotic heart disease of native coronary artery without angina pectoris: Secondary | ICD-10-CM

## 2018-10-14 DIAGNOSIS — E669 Obesity, unspecified: Secondary | ICD-10-CM

## 2018-10-14 MED ORDER — TRIAMCINOLONE ACETONIDE 40 MG/ML IJ SUSP
80 mg | Freq: Once | EPIDURAL | 0 refills | Status: CP
Start: 2018-10-14 — End: ?

## 2018-10-14 MED ORDER — IOHEXOL 240 MG IODINE/ML IV SOLN
2.5 mL | Freq: Once | EPIDURAL | 0 refills | Status: CP
Start: 2018-10-14 — End: ?

## 2018-10-14 NOTE — Discharge Instructions - Supplementary Instructions
GENERAL POST PROCEDURE INSTRUCTIONS    Physician: _________________________________    Procedure Completed Today:  o Joint Injection (hip, knee, shoulder)  o Cervical Epidural Steroid Injection  o Cervical Transforaminal Steroid Injection  o Trigger Point Injection  o Other___________________________ o Thoracic Epidural Steroid Injection  o Lumbar Epidural Steroid Injection  o Lumbar Transforaminal Steroid Injection  o Facet Joint Injection     Important information following your procedure today:  ? You may drive today     ? If you had sedation, you may NOT drive today   Rest at home for the next 6 hours.  You may then begin to resume your normal activities.   DO NOT drive any vehicle, operate any power tools, drink alcohol, make any major decisions, or sign any legal documents for the next 12 hours.  1. Pain relief may not be immediate. It is possible you may even experience an increase in pain during the first 24-48 hours followed by a          gradual decrease of your pain.  2. Though the procedure is generally safe and complications are rare, we do ask that you be aware of any of the following:  ? Any swelling, persistent redness, new bleeding or drainage from the site of the injection.  ? You should not experience a severe headache.  ? You should not run a fever over 101oF.  ? New onset of sharp, severe back and or neck pain.  ? New onset of upper or lower extremity numbness or weakness.  ? New difficulty controlling bowel or bladder function after injection.  ? New shortness of breath.  If any of these occur, please call to report this occurrence to a nurse at [phone]. If you are calling after 4:00pm, on the weekends or holidays please call [phone] and ask to have the pain management resident physician on call for the physician paged or go to your local emergency room.   3. You may experience soreness at the injection site. Ice can be applied at 20 minute intervals for the first 24 hours. The following day              you may alternate ice with heat if you are experiencing muscle tightness, otherwise continue with ice. Ice works best at decreasing           pain. Avoid application of direct heat, hot showers or hot tubs today.  4. Avoid strenuous activity today. You many resume your regular activities and exercise tomorrow.  5. Patients with diabetes may see an elevation in blood sugars for 7-10 days after the injection. It is important to pay close attention to               your diet, check your blood sugars daily and repost extreme elevations to the physician that treats your diabetes.  6. Patients taking daily blood thinners can resume their regular dose this evening.  7. It is important that you take all medications ordered by your pain physician. Taking medications as ordered is an important part of you           pain care plan. If you cannot continue the medication plan, please notify the physician.    Possible side effects to steroids that may occur:  ? Flushing or redness of the face  ? Irritability  ? Fluid retention  ? Change in women's menses      Follow up appointment as needed if in the event  you are unable to keep an appointment please notify the scheduler 24 hours in advance at [phone]. If you have questions for the surgery center, call [facility] at [phone].

## 2018-10-14 NOTE — Progress Notes
SPINE CENTER  INTERVENTIONAL PAIN PROCEDURE HISTORY AND PHYSICAL    No chief complaint on file.      HISTORY OF PRESENT ILLNESS:  [name] presents for care regarding back and leg pain, the last injection helped more than 50% for 2-3 months    Medical History:   Diagnosis Date   ??? Arthritis    ??? Ascending aortic aneurysm (HCC) 01/08/2008   ??? Asymptomatic PVCs 03/11/2015    03/2015 per pt, has had them for a long time & does not feel them usually    ??? CAD (coronary artery disease)    ??? Dyslipidemia    ??? Erectile dysfunction    ??? Fatty liver    ??? HLD (hyperlipidemia) 01/08/2008   ??? Hypertension 01/08/2008   ??? Idiopathic polyneuropathy    ??? Lumbar radiculopathy    ??? NICM (nonischemic cardiomyopathy) (HCC)    ??? 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 Obesity    ??? Positive ANA (antinuclear antibody) 02/03/2016   ??? Positive ANA (antinuclear antibody) 02/03/2016   ??? Pulmonary embolism [facility]) 2015 -- july 20th    takes xaralto anticoagulant -- treated at [facility]       Surgical History:   Procedure Laterality Date   ??? FASCIOTOMY Right 1992   ??? KNEE SURGERY Right 02/2007    Torn meniscus removed   ??? HX LUMBAR FUSION  02/2008    L4-5   ??? AAA REPAIR  06/2008   ??? HX JOINT REPLACEMENT Right 06/2010   ??? HERNIA REPAIR  01/2013    x2   ??? KNEE REPLACEMENT Left 03/2013   ??? CYST REMOVAL Left 06/2013    wrist   ??? STIMULATOR IMPLANT  12/2014    Spinal stimulator [employer]   ??? CYST REMOVAL Right 08/2017    wrist   ??? SHOULDER SURGERY  08/2018    reversed arthroplasty   ??? PR LAPAROSCOPY SURG RPR INITIAL INGUINAL HERNIA         family history includes Cancer in his father and paternal grandmother; DVT in his brother and mother; Stroke in his paternal grandfather.    Social History     Socioeconomic History   ??? Marital status: Married     Spouse name: Not on file   ??? Number of children: Not on file   ??? Years of education: Not on file   ??? Highest education level: Not on file Occupational History   ??? Not on file   Tobacco Use   ??? Smoking status: Former Smoker     Years: 20.00     Last attempt to quit: 08/24/1991     Years since quitting: 27.1   ??? Smokeless tobacco: Never Used   Substance and Sexual Activity   ??? Alcohol use: No     Alcohol/week: 0.0 standard drinks   ??? Drug use: No   ??? Sexual activity: Not Currently     Partners: Female   Other Topics Concern   ??? 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 Blood Transfusions Not Asked   ??? Caffeine Concern Not Asked   ??? Occupational Exposure Not Asked   ??? Hobby Hazards Not Asked   ??? Sleep Concern Not Asked   ??? Stress Concern Not Asked   ??? Weight Concern Not Asked   ??? Special Diet Not Asked   ??? Back Care Not Asked   ???  Exercise Not Asked   ??? Bike Helmet Not Asked   ??? Seat Belt Not Asked   ??? Self-Exams Not Asked   Social History Narrative   ??? Not on file       Allergies   Allergen Reactions   ??? Other [Unclassified Drug] BLISTERS     dissolving stiches in mouth after dental surgery caused blisters   ??? Lipitor [Atorvastatin] FLUSHING (SKIN)   ??? Niaspan [Niacin] FLUSHING (SKIN)   ??? Rubber SEE COMMENTS     Neoprene rubber causes contact dermatitis (CAUSED BY THIOUREA IN NEOPRENE)       There were no vitals filed for this visit.    REVIEW OF SYSTEMS: 10 point ROS obtained and negative except for back and leg pain      PHYSICAL EXAM:  General: Alert and oriented, very pleasant male.   HEENT showed extraocular muscles were intact and no other abnormalities.  Unlabored breathing.  Regular rate and rhythm on CV exam.   5/5 strength in bilateral upper and lower extremities.    Sensation is intact to light touch and equal in the upper and lower extremities.        IMPRESSION:    1. Lumbar disc disease with radiculopathy    2. Lumbar radiculopathy         PLAN: Lumbar Transforaminal Steroid Injection at bilateral L5-S1

## 2018-10-15 ENCOUNTER — Encounter: Admit: 2018-10-15 | Discharge: 2018-10-15

## 2018-10-15 NOTE — Procedures
Attending Surgeon: Clovis Cao, MD    Anesthesia: Local    Pre-Procedure Diagnosis:   1. Lumbar disc disease with radiculopathy    2. Lumbar radiculopathy        Post-Procedure Diagnosis:   1. Lumbar disc disease with radiculopathy    2. Lumbar radiculopathy        SNR/TF LMBR/SAC Injection  Procedure: transforaminal epidural    Laterality: bilateral    Location: lumbar - L5-S1      Consent:   Consent obtained: written  Consent given by: patient  Risks discussed: allergic reaction, bleeding, infection, weakness and no change or worsening in pain  Alternatives discussed: alternative treatment     Universal Protocol:  Relevant documents: relevant documents present and verified  Test results: test results available and properly labeled  Imaging studies: imaging studies available  Required items: required blood products, implants, devices, and special equipment available  Site marked: the operative site was marked  Patient identity confirmed: Patient identify confirmed verbally with patient.        Time out: Immediately prior to procedure a "time out" was called to verify the correct patient, procedure, equipment, support staff and site/side marked as required      Procedures Details:   Indications: pain   Prep: chlorhexidine  Patient position: prone  Estimated Blood Loss: minimal  Specimens: none  Number of Levels: 1  Approach: left paramedian  Guidance: fluoroscopy  Needle size: 25 G  Injection procedure: Negative aspiration for blood  Patient tolerance: Patient tolerated the procedure well with no immediate complications. Pressure was applied, and hemostasis was accomplished.  Outcome: Pain improved  Comments: Kenalog 40 mg was injected at each location        Estimated blood loss: none or minimal  Specimens: none  Patient tolerated the procedure well with no immediate complications. Pressure was applied, and hemostasis was accomplished.

## 2018-10-16 ENCOUNTER — Encounter: Admit: 2018-10-16 | Discharge: 2018-10-16

## 2018-10-17 ENCOUNTER — Encounter: Admit: 2018-10-17 | Discharge: 2018-10-17

## 2018-10-17 MED ORDER — PREGABALIN 200 MG PO CAP
200 mg | ORAL_CAPSULE | Freq: Two times a day (BID) | ORAL | 0 refills | Status: DC
Start: 2018-10-17 — End: 2019-01-20

## 2018-10-17 NOTE — Telephone Encounter
RN spoke to patient to clarify dose of Lyrica. Patient stated that he was having significant leg swelling with the Lyrica 225 mg. When he saw his doctor at the [facility] center they advised him to go back to the  200 mg. Patient stated leg swelling has subsided and would like to continue with the 200 mg bid. Patient asked that [facility].

## 2018-10-21 ENCOUNTER — Encounter: Admit: 2018-10-21 | Discharge: 2018-10-21

## 2018-10-22 ENCOUNTER — Encounter: Admit: 2018-10-22 | Discharge: 2018-10-22

## 2018-10-23 ENCOUNTER — Encounter: Admit: 2018-10-23 | Discharge: 2018-10-23

## 2018-10-23 NOTE — Telephone Encounter
I don't think you do need to still see Dr. Reymundo Poll at this point.

## 2018-10-29 MED ORDER — TIZANIDINE 2 MG PO TAB
ORAL_TABLET | Freq: Three times a day (TID) | 3 refills | Status: AC | PRN
Start: 2018-10-29 — End: ?

## 2018-10-31 ENCOUNTER — Encounter: Admit: 2018-10-31 | Discharge: 2018-10-31

## 2018-10-31 ENCOUNTER — Ambulatory Visit: Admit: 2018-10-31 | Discharge: 2018-11-01

## 2018-10-31 DIAGNOSIS — N138 Other obstructive and reflux uropathy: Secondary | ICD-10-CM

## 2018-10-31 DIAGNOSIS — M5416 Radiculopathy, lumbar region: Secondary | ICD-10-CM

## 2018-10-31 DIAGNOSIS — I10 Essential (primary) hypertension: Secondary | ICD-10-CM

## 2018-10-31 DIAGNOSIS — N529 Male erectile dysfunction, unspecified: Secondary | ICD-10-CM

## 2018-10-31 DIAGNOSIS — K760 Fatty (change of) liver, not elsewhere classified: Secondary | ICD-10-CM

## 2018-10-31 DIAGNOSIS — E785 Hyperlipidemia, unspecified: Secondary | ICD-10-CM

## 2018-10-31 DIAGNOSIS — R768 Other specified abnormal immunological findings in serum: Secondary | ICD-10-CM

## 2018-10-31 DIAGNOSIS — I2510 Atherosclerotic heart disease of native coronary artery without angina pectoris: Secondary | ICD-10-CM

## 2018-10-31 DIAGNOSIS — N401 Enlarged prostate with lower urinary tract symptoms: Secondary | ICD-10-CM

## 2018-10-31 DIAGNOSIS — E669 Obesity, unspecified: Secondary | ICD-10-CM

## 2018-10-31 DIAGNOSIS — I712 Thoracic aortic aneurysm, without rupture: Secondary | ICD-10-CM

## 2018-10-31 DIAGNOSIS — G609 Hereditary and idiopathic neuropathy, unspecified: Secondary | ICD-10-CM

## 2018-10-31 DIAGNOSIS — I428 Other cardiomyopathies: Secondary | ICD-10-CM

## 2018-10-31 DIAGNOSIS — I351 Nonrheumatic aortic (valve) insufficiency: Secondary | ICD-10-CM

## 2018-10-31 DIAGNOSIS — I2699 Other pulmonary embolism without acute cor pulmonale: Secondary | ICD-10-CM

## 2018-10-31 DIAGNOSIS — I493 Ventricular premature depolarization: Secondary | ICD-10-CM

## 2018-10-31 DIAGNOSIS — M1990 Unspecified osteoarthritis, unspecified site: Secondary | ICD-10-CM

## 2018-10-31 NOTE — Progress Notes
Date of Service: 10/31/2018    Victor Graham is a 68 y.o. male.  DOB: [birth year]  MRN: [id number]     Subjective:             History of Present Illness    Patient is here for f/u of his hoarse voice.  He has had this going on for several months.  We have increased his allergic rhinitis treatments and he has had some improvement but it continues.  He denies any dysphagia or sob or cough.  He denies any fevers, chills, n/v or GERD symptoms.      Medical History:   Diagnosis Date   ??? Arthritis    ??? Ascending aortic aneurysm (HCC) 01/08/2008   ??? Asymptomatic PVCs 03/11/2015    03/2015 per pt, has had them for a long time & does not feel them usually    ??? CAD (coronary artery disease)    ??? Dyslipidemia    ??? Erectile dysfunction    ??? Fatty liver    ??? HLD (hyperlipidemia) 01/08/2008   ??? Hypertension 01/08/2008   ??? Idiopathic polyneuropathy    ??? Lumbar radiculopathy    ??? NICM (nonischemic cardiomyopathy) (HCC)    ??? 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 Obesity    ??? Positive ANA (antinuclear antibody) 02/03/2016   ??? Positive ANA (antinuclear antibody) 02/03/2016   ??? Pulmonary embolism [facility]) 2015 -- july 20th    takes xaralto anticoagulant -- treated at [facility]     Surgical History:   Procedure Laterality Date   ??? FASCIOTOMY Right 1992   ??? KNEE SURGERY Right 02/2007    Torn meniscus removed   ??? HX LUMBAR FUSION  02/2008    L4-5   ??? AAA REPAIR  06/2008   ??? HX JOINT REPLACEMENT Right 06/2010   ??? HERNIA REPAIR  01/2013    x2   ??? KNEE REPLACEMENT Left 03/2013   ??? CYST REMOVAL Left 06/2013    wrist   ??? STIMULATOR IMPLANT  12/2014    Spinal stimulator [employer]   ??? CYST REMOVAL Right 08/2017    wrist   ??? SHOULDER SURGERY  08/2018    reversed arthroplasty   ??? PR LAPAROSCOPY SURG RPR INITIAL INGUINAL HERNIA       Family History   Problem Relation Age of Onset   ??? DVT Mother    ??? Cancer Father    ??? DVT Brother    ??? Stroke Paternal Grandfather ??? Cancer Paternal Grandmother      Social History     Socioeconomic History   ??? Marital status: Married     Spouse name: Not on file   ??? Number of children: Not on file   ??? Years of education: Not on file   ??? Highest education level: Not on file   Occupational History   ??? Not on file   Tobacco Use   ??? Smoking status: Former Smoker     Years: 20.00     Last attempt to quit: 08/24/1991     Years since quitting: 27.2   ??? Smokeless tobacco: Never Used   Substance and Sexual Activity   ??? Alcohol use: No     Alcohol/week: 0.0 standard drinks   ??? Drug use: No   ??? Sexual activity: Not Currently     Partners: Female   Other Topics Concern   ??? 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 Blood Transfusions Not Asked   ??? Caffeine Concern Not Asked   ??? Occupational Exposure Not Asked   ??? Hobby Hazards Not Asked   ??? Sleep Concern Not Asked   ??? Stress Concern Not Asked   ??? Weight Concern Not Asked   ??? Special Diet Not Asked   ??? Back Care Not Asked   ??? Exercise Not Asked   ??? Bike Helmet Not Asked   ??? Seat Belt Not Asked   ??? Self-Exams Not Asked   Social History Narrative   ??? Not on file              Review of Systems   Constitutional: Negative for chills, fatigue and fever.   HENT: Positive for voice change.    Eyes: Negative.    Respiratory: Negative for cough and shortness of breath.    Cardiovascular: Negative for chest pain.   Gastrointestinal: Negative.    Endocrine: Negative.    Musculoskeletal: Negative.    Skin: Negative.    Allergic/Immunologic: Negative.    Neurological: Negative.    Hematological: Negative.    Psychiatric/Behavioral: Negative.          Objective:         ??? carvedilol (COREG) 12.5 mg tablet Take one tablet by mouth twice daily.   ??? cholecalciferol (VITAMIN D) 1,000 units tablet Take 1 capsule by mouth daily.   ??? GLUCOSAMINE HCL PO Take 1,000 mg by mouth twice daily.   ??? lisinopriL (ZESTRIL) 10 mg tablet Take one tablet by mouth daily. ??? meloxicam (MOBIC) 15 mg tablet Take one tablet by mouth daily as needed.   ??? metFORMIN (GLUCOPHAGE) 500 mg tablet Take one tablet by mouth daily.   ??? montelukast (SINGULAIR) 10 mg tablet Take one tablet by mouth at bedtime daily.   ??? nystatin (NYSTOP) 100,000 unit/g topical powder Apply  topically to affected area.   ??? oxyCODONE (ROXICODONE) 5 mg tablet Take 5 mg by mouth   ??? pregabalin (LYRICA) 200 mg capsule Take one capsule by mouth twice daily.   ??? rivaroxaban (XARELTO) 20 mg tablet Take one tablet by mouth daily with dinner.   ??? simvastatin (ZOCOR) 20 mg tablet Take two tablets by mouth at bedtime daily.   ??? spironolactone (ALDACTONE) 25 mg tablet Take one tablet by mouth daily. Please call to schedule office visit with Dr Iver Nestle   ??? tiZANidine (ZANAFLEX) 2 mg tablet TAKE 1 TO 2 TABLETS THREE TIMES A DAY AS NEEDED   ??? traMADol (ULTRAM) 50 mg tablet Take 50 mg by mouth every 8 hours as needed.     Vitals:    10/31/18 1503   BP: 106/62   Pulse: 73   Temp: 36.9 ???C (98.4 ???F)   TempSrc: Oral   SpO2: 97%   Weight: 131.5 kg (290 lb)   PainSc: Zero     Body mass index is 41.61 kg/m???.     Physical Exam  Vitals signs reviewed.   Constitutional:       Appearance: Normal appearance.   HENT:      Head: Normocephalic and atraumatic.      Right Ear: External ear normal.      Left Ear: External ear normal.      Nose: Nose normal.      Mouth/Throat:      Mouth: Mucous membranes are moist.   Eyes:      Extraocular Movements: Extraocular movements intact.  Conjunctiva/sclera: Conjunctivae normal.   Neck:      Musculoskeletal: Neck supple. No muscular tenderness.   Cardiovascular:      Rate and Rhythm: Normal rate and regular rhythm.      Heart sounds: Normal heart sounds. No murmur.   Pulmonary:      Effort: Pulmonary effort is normal.      Breath sounds: Normal breath sounds. No wheezing.   Lymphadenopathy:      Cervical: No cervical adenopathy.   Skin:     General: Skin is warm. Capillary Refill: Capillary refill takes less than 2 seconds.      Findings: No rash.   Neurological:      General: No focal deficit present.      Mental Status: He is alert. Mental status is at baseline.      Coordination: Coordination normal.   Psychiatric:         Mood and Affect: Mood normal.         Behavior: Behavior normal.         Judgment: Judgment normal.              Assessment and Plan:  1. Hoarseness or changing voice  - I have reviewed the patient's medical history in detail and updated the computerized patient record.  - continue with current treatments  - AMB REFERRAL TO ENT    2. BPH with obstruction/lower urinary tract symptoms  - I have reviewed the patient's medical history in detail and updated the computerized patient record.    - AMB REFERRAL TO UROLOGY

## 2018-11-01 DIAGNOSIS — R499 Unspecified voice and resonance disorder: Principal | ICD-10-CM

## 2018-11-07 ENCOUNTER — Encounter: Admit: 2018-11-07 | Discharge: 2018-11-07

## 2018-11-11 ENCOUNTER — Ambulatory Visit: Admit: 2018-11-11 | Discharge: 2018-11-12

## 2018-11-11 ENCOUNTER — Encounter: Admit: 2018-11-11 | Discharge: 2018-11-11

## 2018-11-11 DIAGNOSIS — M1990 Unspecified osteoarthritis, unspecified site: Secondary | ICD-10-CM

## 2018-11-11 DIAGNOSIS — G609 Hereditary and idiopathic neuropathy, unspecified: Secondary | ICD-10-CM

## 2018-11-11 DIAGNOSIS — Z8679 Personal history of other diseases of the circulatory system: Secondary | ICD-10-CM

## 2018-11-11 DIAGNOSIS — I428 Other cardiomyopathies: Secondary | ICD-10-CM

## 2018-11-11 DIAGNOSIS — E669 Obesity, unspecified: Secondary | ICD-10-CM

## 2018-11-11 DIAGNOSIS — I2699 Other pulmonary embolism without acute cor pulmonale: Secondary | ICD-10-CM

## 2018-11-11 DIAGNOSIS — E785 Hyperlipidemia, unspecified: Secondary | ICD-10-CM

## 2018-11-11 DIAGNOSIS — I10 Essential (primary) hypertension: Secondary | ICD-10-CM

## 2018-11-11 DIAGNOSIS — I351 Nonrheumatic aortic (valve) insufficiency: Secondary | ICD-10-CM

## 2018-11-11 DIAGNOSIS — M5416 Radiculopathy, lumbar region: Secondary | ICD-10-CM

## 2018-11-11 DIAGNOSIS — K760 Fatty (change of) liver, not elsewhere classified: Secondary | ICD-10-CM

## 2018-11-11 DIAGNOSIS — I2510 Atherosclerotic heart disease of native coronary artery without angina pectoris: Secondary | ICD-10-CM

## 2018-11-11 DIAGNOSIS — I493 Ventricular premature depolarization: Secondary | ICD-10-CM

## 2018-11-11 DIAGNOSIS — I712 Thoracic aortic aneurysm, without rupture: Secondary | ICD-10-CM

## 2018-11-11 DIAGNOSIS — I5030 Unspecified diastolic (congestive) heart failure: Secondary | ICD-10-CM

## 2018-11-11 DIAGNOSIS — N529 Male erectile dysfunction, unspecified: Secondary | ICD-10-CM

## 2018-11-11 DIAGNOSIS — R768 Other specified abnormal immunological findings in serum: Secondary | ICD-10-CM

## 2018-11-11 MED ORDER — LISINOPRIL 10 MG PO TAB
10 mg | ORAL_TABLET | Freq: Every day | ORAL | 2 refills | Status: DC
Start: 2018-11-11 — End: 2019-08-03

## 2018-11-11 MED ORDER — CARVEDILOL 6.25 MG PO TAB
6.25 mg | ORAL_TABLET | Freq: Two times a day (BID) | ORAL | 3 refills | 90.00000 days | Status: DC
Start: 2018-11-11 — End: 2019-08-03

## 2018-11-11 MED ORDER — FUROSEMIDE 40 MG PO TAB
40 mg | ORAL_TABLET | Freq: Every morning | ORAL | 3 refills | 90.00000 days | Status: DC
Start: 2018-11-11 — End: 2019-10-20

## 2018-11-11 NOTE — Progress Notes
Date of Service: 11/11/2018    Victor Graham is a 68 y.o. male.       HPI     Mr. Rappaport is a 68 year old gentleman, BMI of 42, history of ascending aortic aneurysm repair back in 2010, borderline LV dysfunction, hypertension dyslipidemia and mild to moderate coronary disease.  He is coming in for a regularly scheduled follow-up.    He mentions shortness of breath with mild activity.  He is also noticed quite a bit of swelling in both lower extremities, slightly more pronounced on the right side.  He does sleep with his head end of the bed up a little bit.  There is no paroxysmal nocturnal dyspnea.  He denies any chest pains.  He has been compliant with all his medications.    The shoulder surgery went well and the pain level got better very quickly.    Last echo was done in 2019 with an EF of 55%, mild atrial enlargement with mild mitral annular calcification.  The IVC was dilated on that study.  The RV was felt to be mildly dysfunctional.         Vitals:    11/11/18 0755   BP: 90/68   BP Source: Arm, Right Upper   Pulse: 67   Weight: 131.2 kg (289 lb 3.2 oz)   Height: 1.778 m (5' 10)   PainSc: Zero     Body mass index is 41.5 kg/m???.     Past Medical History  Patient Active Problem List    Diagnosis Date Noted   ??? Idiopathic polyneuropathy 05/23/2018   ??? Lumbar radiculopathy 09/30/2015   ??? 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 Pulmonary embolism (HCC) 09/17/2014     12-09-13: 2D Echo for PE: Technically difficult study: EF~55%. In comparison to previous echocardiogram from 09-2013 there is no significant difference.    Noted incidentally on a CT done in 10/2013 for monitoring the asc aortic aneurysm. Multiple emboli were reported along with suggestion of RV strain. Has a FH of DVT (brother & mother)      ??? 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 Started Rapaflo 8mg  01/26/14 - LUTS much improved. Continue Rapaflo 8mg   02/15/15 - stable LUTS      08/16/15- Minimal nocturia and no other symptoms. Interested in trialing discontinuation of his Rapaflo. He will call if his symptoms worsen and we can refill his medications over the phone.     08/14/16 - symptoms stable, PVR 64mL      ??? Class 3 severe obesity due to excess calories with serious comorbidity and body mass index (BMI) of 40.0 to 44.9 in adult [facility]) 10/28/2013   ??? 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 New finding compared to prior EF 45-50% in '09 (by echo at [facility]), LV gram from '09 reviewed- EF 45%     ??? 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 Hypertension 01/08/2008     09-17-14: Well controlled. Taking Lisinopril 10 mg daily. Coreg 25 mg 0.5 tablet BID  Well controlled     ??? HLD (hyperlipidemia) 01/08/2008     10-08-14: TC 164, HDL 35, TG 237. On  Zocor 20 mg  07-07-14: TC 209, LDL 234, HDL 45, TG 234  11/14 TC 153, LDL 74, HDL 45, TG 172  1/14 good numbers  5/13 TC 152, LDL 76, HDL 39, TG 183  2/11 TC 147, LDL 81, HDL 42, TG 138  Panel checked in 6/10- TC 144, LDL 87, HDL 36, TG 131, on simva 40 qhs, intolerant of niacin     ??? CAD (coronary artery disease) 01/08/2008     Mild to moderate, non-obstructive by angio in 10/09 done following an abnormal stress thallium            Review of Systems   Cardiovascular: Positive for leg swelling.   Respiratory: Positive for shortness of breath.    Musculoskeletal: Positive for back pain.       Physical Exam  General Appearance: well for age, appears comfortable  Skin: warm, no ulcers, no xanthomas  Eyes/lids: no conjunctival pallor, no arcus, no xanthelasma  Lips/oral mucosa: no cyanosis  Neck: neck veins difficult to assess due to body habitus  Carotids: normal upstroke, no bruit  Chest: normal appearance  Lungs: clear Cardiac rhythm: regular rhythm & normal rate  Cardiac auscultation: Normal S1 & S2, no S3 or S4, no murmur  Abdomen: soft, non tender, bowel sounds normal, no pulsations/bruits  Extremities: Bilateral 1-2+ LE edema, R>L, old R leg lateral fasciotomy scar  Neurologic/psych: oriented, no gross motor deficits, normal gait, normal mood & affect     EKG shows sinus rhythm, nonspecific T wave changes in the anterior precordial    Problems Addressed Today  Encounter Diagnoses   Name Primary?   ??? Heart failure with preserved ejection fraction, unspecified HF chronicity (HCC) Yes   ??? Essential hypertension    ??? Hyperlipidemia, unspecified hyperlipidemia type    ??? Cardiomyopathy, idiopathic (HCC)    ??? Status post thoracic aortic aneurysm repair        Assessment and Plan     ???In summary, Mr. Arnott is a 68 year old gentleman with the following cardiac and related issues:  ??????  1. History of mild nonischemic cardiomyopathy, presently normalized (echo showed normal EF but gated EF on the stress test was 45%).  The RV was felt to be mildly dysfunctional.  The IVC was dilated on the last study.  He is coming in at the moment with increasing dyspnea and leg edema.  The presentation appears to be consistent with HFpEF.  Furosemide at 40 mg daily is being added.??? A Chem-7 will be needed in 1 to 2 weeks.???????????????  2. Hypertension,?????????the readings are actually on the low side.  Given the need for furosemide, will cut back on the carvedilol to 6.25 mg twice daily.  3. History of unprovoked PE,???with positive family history DVT,???to be maintained on long-term Xarelto.   4. Dyslipidemia-??? on simva,??????the recent LDL was 100.   5. PVCs- asymptomatic, on beta-blockers. ??????  6. History of ascending aortic aneurysm repair???in 2010.  Surveillance CT has been requested.  7. AI- not much reported on the last echo in 2019.   8. Atypical chest pains, no recurrence of the moment.  Recently done stress test had shown a small area of inferolateral perfusion abnormality felt to be indicative of a mix of injury and ischemia in the face of severe soft tissue attenuation.  He is not having any chest pains at the moment.  I do not think angiography is warranted.  ??????  ???  It was a pleasure to see Mr. Edmundson in the  clinic today.?????????           Current Medications (including today's revisions)  ??? carvediloL (COREG) 6.25 mg tablet Take one tablet by mouth twice daily.   ??? cholecalciferol (VITAMIN D) 1,000 units tablet Take 1 capsule by mouth daily.   ??? furosemide (LASIX) 40 mg tablet Take one tablet by mouth every morning.   ??? GLUCOSAMINE HCL PO Take 1,000 mg by mouth twice daily.   ??? lisinopriL (ZESTRIL) 10 mg tablet Take one tablet by mouth daily.   ??? meloxicam (MOBIC) 15 mg tablet Take one tablet by mouth daily as needed.   ??? metFORMIN (GLUCOPHAGE) 500 mg tablet Take one tablet by mouth daily.   ??? montelukast (SINGULAIR) 10 mg tablet Take one tablet by mouth at bedtime daily.   ??? pregabalin (LYRICA) 200 mg capsule Take one capsule by mouth twice daily.   ??? rivaroxaban (XARELTO) 20 mg tablet Take one tablet by mouth daily with dinner.   ??? simvastatin (ZOCOR) 20 mg tablet Take two tablets by mouth at bedtime daily.   ??? spironolactone (ALDACTONE) 25 mg tablet Take one tablet by mouth daily. Please call to schedule office visit with Dr Iver Nestle   ??? tiZANidine (ZANAFLEX) 2 mg tablet TAKE 1 TO 2 TABLETS THREE TIMES A DAY AS NEEDED   ??? traMADol (ULTRAM) 50 mg tablet Take 50 mg by mouth every 8 hours as needed.

## 2018-11-12 DIAGNOSIS — Z9889 Other specified postprocedural states: Secondary | ICD-10-CM

## 2018-11-13 ENCOUNTER — Encounter: Admit: 2018-11-13 | Discharge: 2018-11-13

## 2018-11-17 ENCOUNTER — Encounter: Admit: 2018-11-17 | Discharge: 2018-11-17

## 2018-11-18 ENCOUNTER — Encounter: Admit: 2018-11-18 | Discharge: 2018-11-18

## 2018-11-18 MED ORDER — RIVAROXABAN 20 MG PO TAB
20 mg | ORAL_TABLET | Freq: Every day | ORAL | 3 refills | 30.00000 days | Status: AC
Start: 2018-11-18 — End: ?

## 2018-11-18 NOTE — Telephone Encounter
Pt left VM requesting Xarelto refill to [facility], sent it.   Sent him [employer]

## 2018-11-19 ENCOUNTER — Encounter: Admit: 2018-11-19 | Discharge: 2018-11-19

## 2018-11-20 ENCOUNTER — Encounter: Admit: 2018-11-20 | Discharge: 2018-11-20

## 2018-11-20 ENCOUNTER — Ambulatory Visit: Admit: 2018-11-20 | Discharge: 2018-11-21

## 2018-11-20 DIAGNOSIS — M5416 Radiculopathy, lumbar region: Principal | ICD-10-CM

## 2018-11-20 DIAGNOSIS — M5136 Other intervertebral disc degeneration, lumbar region: Secondary | ICD-10-CM

## 2018-11-20 NOTE — Progress Notes
[facility] CLINIC NOTE       SUBJECTIVE:   Victor Graham returns to clinic for continued evaluation and treatment of low back and leg pain  - Underwent bilateral L5-S1 TFESI on 7/10  - Injections are providing significant benefit for leg pain however his biggest concern is the weakness and numbness he continues to feel in both legs.  - Improvement in symptoms lasts about 3-4 months  - Starting to effect his balance more and more  - Using a cane to ambulate  - EMG showed evidence of nerve compression of L5-S2  - Only able to go about 50-100 yards before needing to stop secondary to pain  - Other aggravating factors include extension of his low back from a flexed position  - No PT in several months which he didn't have any balance issues at that time  - Last evaluated by Dr. Lisette Grinder with [facility] in 2018.  Informed to get BMI below 40 before proceeding with surgery  - Abbott SCS not providing much benefit at this time    Meds:  - Lyrica  - Mobic  - Tizanidine  - Off Tramadol because ineffective           Review of Systems   Constitutional: Negative.    HENT: Positive for tinnitus and voice change.    Eyes: Negative.    Respiratory: Negative.    Cardiovascular: Positive for leg swelling.   Gastrointestinal: Positive for constipation.   Endocrine: Negative.    Genitourinary: Positive for frequency and testicular pain.   Musculoskeletal: Positive for back pain and gait problem.   Skin: Negative.    Allergic/Immunologic: Negative.    Hematological: Negative.    Psychiatric/Behavioral: Negative.        Current Outpatient Medications:   ???  carvediloL (COREG) 6.25 mg tablet, Take one tablet by mouth twice daily., Disp: 180 tablet, Rfl: 3  ???  cholecalciferol (VITAMIN D) 1,000 units tablet, Take 1 capsule by mouth daily., Disp: , Rfl:   ???  furosemide (LASIX) 40 mg tablet, Take one tablet by mouth every morning., Disp: 90 tablet, Rfl: 3  ???  GLUCOSAMINE HCL PO, Take 1,000 mg by mouth twice daily., Disp: , Rfl: ???  lisinopriL (ZESTRIL) 10 mg tablet, Take one tablet by mouth daily., Disp: 90 tablet, Rfl: 2  ???  meloxicam (MOBIC) 15 mg tablet, Take one tablet by mouth daily as needed., Disp: 90 tablet, Rfl: 2  ???  metFORMIN (GLUCOPHAGE) 500 mg tablet, Take one tablet by mouth daily., Disp: 90 tablet, Rfl: 1  ???  montelukast (SINGULAIR) 10 mg tablet, Take one tablet by mouth at bedtime daily., Disp: 90 tablet, Rfl: 3  ???  pregabalin (LYRICA) 200 mg capsule, Take one capsule by mouth twice daily., Disp: 180 capsule, Rfl: 0  ???  rivaroxaban (XARELTO) 20 mg tablet, Take one tablet by mouth daily with dinner., Disp: 90 tablet, Rfl: 3  ???  simvastatin (ZOCOR) 20 mg tablet, Take two tablets by mouth at bedtime daily., Disp: 90 tablet, Rfl: 1  ???  spironolactone (ALDACTONE) 25 mg tablet, Take one tablet by mouth daily. Please call to schedule office visit with Dr Iver Nestle, Disp: 90 tablet, Rfl: 3  ???  tiZANidine (ZANAFLEX) 2 mg tablet, TAKE 1 TO 2 TABLETS THREE TIMES A DAY AS NEEDED, Disp: 90 tablet, Rfl: 3  ???  traMADol (ULTRAM) 50 mg tablet, Take 50 mg by mouth every 8 hours as needed., Disp: , Rfl:   Allergies   Allergen  Reactions   ??? Other [Unclassified Drug] BLISTERS     dissolving stiches in mouth after dental surgery caused blisters   ??? Lipitor [Atorvastatin] FLUSHING (SKIN)   ??? Niaspan [Niacin] FLUSHING (SKIN)   ??? Rubber SEE COMMENTS     Neoprene rubber causes contact dermatitis (CAUSED BY THIOUREA IN NEOPRENE)     Physical Exam  Vitals:    11/20/18 0946   BP: 101/63   Pulse: 76   Resp: 18   Temp: 36.9 ???C (98.5 ???F)   TempSrc: Oral   SpO2: 96%   Weight: 127.9 kg (282 lb)   Height: 177.8 cm (70)     Oswestry Total Score:: 50  Pain Score: (0-1 with sitting, 5-6 with walking)  Body mass index is 40.46 kg/m???.        General: Alert, cooperative, no acute distress.  HEENT: Normocephalic, atraumatic.  Neck: Supple.  Lungs: Unlabored respirations, bilateral and equal chest excursion.  Heart: Regular rate.  Skin: Warm and dry to touch. Abdomen: Nondistended.    Lumbar spine:  Appearance: Prior surgical incision  Lumbar tenderness: Yes  SI joint tenderness: Bilateral  Pain with extension: Yes  Lower extremity strength: 5/5 bilaterally  Decreased sensation to light touch along the L5 dermatome in the right leg  Straight leg raise positive?: Bilateral  Reflexes:  2/4 in bilateral patellar and achilles tendons    Neurological: Alert and oriented x3.    IMPRESSION:  1. Lumbar radiculopathy    2. DDD (degenerative disc disease), lumbar    3. Spinal cord stimulator status          PLAN:    1. Referral placed for PT at [facility]  2. Continue current pain regimen  3. F/u in 2 month via telehealth    Mikael Spray  Chronic Pain Fellow; PGY 5  Department of Anesthesiology  [facility]    I was present for key elements of the history and physical and agree with the above note.  Thank you for the opportunity to participate in the care of Victor Graham, please do not hesitate to contact me with questions.

## 2018-11-21 DIAGNOSIS — Z9689 Presence of other specified functional implants: Secondary | ICD-10-CM

## 2018-11-24 ENCOUNTER — Ambulatory Visit: Admit: 2018-11-24 | Discharge: 2018-11-24 | Payer: MEDICARE

## 2018-11-24 ENCOUNTER — Encounter: Admit: 2018-11-24 | Discharge: 2018-11-24 | Payer: MEDICARE

## 2018-11-24 ENCOUNTER — Ambulatory Visit: Admit: 2018-11-24 | Discharge: 2018-11-25 | Payer: MEDICARE

## 2018-11-24 DIAGNOSIS — I2782 Chronic pulmonary embolism: Secondary | ICD-10-CM

## 2018-11-24 DIAGNOSIS — R9389 Abnormal findings on diagnostic imaging of other specified body structures: Secondary | ICD-10-CM

## 2018-11-24 DIAGNOSIS — Z8679 Personal history of other diseases of the circulatory system: Secondary | ICD-10-CM

## 2018-11-24 DIAGNOSIS — Z9889 Other specified postprocedural states: Secondary | ICD-10-CM

## 2018-11-24 DIAGNOSIS — I10 Essential (primary) hypertension: Secondary | ICD-10-CM

## 2018-11-24 LAB — BASIC METABOLIC PANEL
Lab: 1.1 mg/dL — AB (ref 0.4–1.24)
Lab: 102 MMOL/L (ref 98–110)
Lab: 108 mg/dL — ABNORMAL HIGH (ref 70–100)
Lab: 11 % — ABNORMAL HIGH (ref 3–12)
Lab: 140 MMOL/L (ref 137–147)
Lab: 27 MMOL/L (ref 21–30)
Lab: 31 mg/dL — ABNORMAL HIGH (ref 7–25)
Lab: 4.3 MMOL/L — AB (ref 3.5–5.1)
Lab: 9.9 mg/dL (ref 8.5–10.6)
Lab: >60 mL/min (ref >60)
Lab: >60 mL/min — ABNORMAL LOW (ref >60)

## 2018-11-24 LAB — POC CREATININE, RAD: Lab: 1.2 mg/dL (ref 0.4–1.24)

## 2018-11-24 MED ORDER — IOHEXOL 350 MG IODINE/ML IV SOLN
70 mL | Freq: Once | INTRAVENOUS | 0 refills | Status: CP
Start: 2018-11-24 — End: ?

## 2018-11-24 MED ORDER — SODIUM CHLORIDE 0.9 % IJ SOLN
50 mL | Freq: Once | INTRAVENOUS | 0 refills | Status: CP
Start: 2018-11-24 — End: ?
  Administered 2018-11-24: 21:00:00 50 mL via INTRAVENOUS

## 2018-11-24 NOTE — Telephone Encounter
I received a message to call back Dr. Altamese Carolina in radiology since Dr. Curly Shores is on leave.    This gentleman's CT appears abnormal with a curvilinear calcification within the graft.  The etiology of this is not clear whether this is a calcified thrombus or other cause.    I spoke to Dr Altamese Carolina.  She recommends a CTA to review aorta further.    We will arrange.

## 2018-11-24 NOTE — Telephone Encounter
Received call from radiology requesting cardiology return call to discuss findings on Chest CT ordered by RKB.  RKB out of office.  Call returned to radiology by MAA.  Per MAA to order Chest CTA as soon as possible for additional imaging related to findings of Chest CT. Called patient and reviewed recommendations for urgent Chest CTA.  Orders placed and confirmed urgent status for Chest CTA today with MOB CT.  Per CT, patient to return to [facility] radiology today for this study.  Patient agrees with plan.

## 2018-11-25 ENCOUNTER — Encounter: Admit: 2018-11-25 | Discharge: 2018-11-25 | Payer: MEDICARE

## 2018-11-25 DIAGNOSIS — R9389 Abnormal findings on diagnostic imaging of other specified body structures: Secondary | ICD-10-CM

## 2018-11-25 DIAGNOSIS — Z9889 Other specified postprocedural states: Secondary | ICD-10-CM

## 2018-11-25 NOTE — Telephone Encounter
Orders entered in [facility] name for cosign.   Sending results to PCP/[facility] Dr Yevette Edwards    Pt called requesting results.   I called him back, reviewed results and rec below.   I gave him Pulm Ph # and asked him to call in a week or so if he hasn't heard from them . [phone])  Also provided CT scheduling # for 3 mo f/u scan

## 2018-11-25 NOTE — Telephone Encounter
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

## 2018-11-26 ENCOUNTER — Encounter: Admit: 2018-11-26 | Discharge: 2018-11-26

## 2018-11-27 ENCOUNTER — Encounter: Admit: 2018-11-27 | Discharge: 2018-11-27

## 2018-12-01 ENCOUNTER — Encounter: Admit: 2018-12-01 | Discharge: 2018-12-01

## 2018-12-01 DIAGNOSIS — J849 Interstitial pulmonary disease, unspecified: Secondary | ICD-10-CM

## 2018-12-02 ENCOUNTER — Encounter: Admit: 2018-12-02 | Discharge: 2018-12-02

## 2018-12-03 ENCOUNTER — Encounter: Admit: 2018-12-03 | Discharge: 2018-12-03

## 2018-12-15 ENCOUNTER — Encounter: Admit: 2018-12-15 | Discharge: 2018-12-15

## 2018-12-15 DIAGNOSIS — E669 Obesity, unspecified: Secondary | ICD-10-CM

## 2018-12-16 ENCOUNTER — Encounter: Admit: 2018-12-16 | Discharge: 2018-12-16

## 2018-12-16 MED ORDER — METFORMIN 500 MG PO TAB
500 mg | ORAL_TABLET | Freq: Every day | ORAL | 1 refills | Status: DC
Start: 2018-12-16 — End: 2019-06-29

## 2018-12-16 MED ORDER — SIMVASTATIN 40 MG PO TAB
40 mg | ORAL_TABLET | Freq: Every evening | ORAL | 1 refills | Status: DC
Start: 2018-12-16 — End: 2019-02-24

## 2018-12-18 ENCOUNTER — Encounter: Admit: 2018-12-18 | Discharge: 2018-12-18

## 2018-12-23 ENCOUNTER — Encounter: Admit: 2018-12-23 | Discharge: 2018-12-23 | Payer: MEDICARE

## 2018-12-25 ENCOUNTER — Ambulatory Visit: Admit: 2018-12-25 | Discharge: 2018-12-25 | Payer: MEDICARE

## 2018-12-25 ENCOUNTER — Encounter: Admit: 2018-12-25 | Discharge: 2018-12-25 | Payer: MEDICARE

## 2018-12-25 ENCOUNTER — Ambulatory Visit: Admit: 2018-12-25 | Discharge: 2018-12-26 | Payer: MEDICARE

## 2018-12-25 NOTE — Progress Notes
Date of Service: 12/25/2018    Subjective:             Victor Graham is a 68 y.o. male.    History of Present Illness    Victor Graham is a very pleasant 68 y.o. gentleman who is referred by Dr. Alonza Smoker for a complaint of dysphonia.      [name] reports a one year history of dysphonia. He does not recall an illness or vocal trauma around the time of symptom onset. Specific voice complaints include a raspy quality to his voice.  He has trouble projecting his voice.  He does report vocal fatigue and decreased clarity with use.  He does not note anterior neck discomfort with voicing.  He reports frequent throat clearing and globus.  His throat clearing is usually not productive.  He has tried flonase, singular and sinus rinses for presumptive allergy with no improvement.    He denies difficulties with swallowing.   He is not avoiding specific foods due to difficulty swallowing.  He denies odynophagia, abdominal pain, hematemesis or melena.    He reports frank reflux symptoms such as retrosternal burning/pain after meals, regurgitation or a sour brash sensation in the back of the throat.  This occurs once per week.  This gets better if he drinks a glass of water.    He denies difficulty breathing.  Of note, he recently has a CT to evaluate his AAA repair.  There may be some changes in the AAA, and he has f/u with his cardiologist soon.  The found a lesion in one of his lungs and is scheduled to see a Pulmonologist.    He denies seasonal or environmental allergies.      VOCAL DEMANDS/HYGIENE:  Victor Graham is retired.  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  After this, he worked for [employer].  He reports that He is talkative.  Specific vocal demands include conversation voice.  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  He has meetings on average 4 times per months.    He reports that He drinks 100+ oz of water per day and 12 oz of caffeinated beverage daily.  He is not currently smoking.    Family History   Problem Relation Age of Onset   ? DVT Mother    ? Cancer Father    ? DVT Brother    ? Stroke Paternal Grandfather    ? Cancer Paternal Grandmother        Social History     Socioeconomic History   ? Marital status: Married     Spouse name: Not on file   ? Number of children: Not on file   ? Years of education: Not on file   ? Highest education level: Not on file   Occupational History   ? Not on file   Tobacco Use   ? Smoking status: Former Smoker     Years: 20.00     Last attempt to quit: 08/24/1991     Years since quitting: 27.3   ? Smokeless tobacco: Never Used   Substance and Sexual Activity   ? Alcohol use: No     Alcohol/week: 0.0 standard drinks   ? Drug use: No   ? Sexual activity: Not Currently     Partners: Female   Other Topics Concern   ? Financial planner Yes     CommentPersonnel officer,  [employer]   ? Blood Transfusions Not Asked   ? Caffeine Concern Not Asked   ? Occupational Exposure Not Asked   ? Hobby Hazards Not Asked   ? Sleep Concern Not Asked   ? Stress Concern Not Asked   ? Weight Concern Not Asked   ? Special Diet Not Asked   ? Back Care Not Asked   ? Exercise Not Asked   ? Bike Helmet Not Asked   ? Seat Belt Not Asked   ? Self-Exams Not Asked   Social History Narrative   ? Not on file          Review of Systems   Constitutional: Negative.    HENT: Positive for voice change.    Eyes: Negative.    Respiratory: Negative.    Cardiovascular: Negative.    Gastrointestinal: Negative.    Endocrine: Negative.    Genitourinary: Negative.    Musculoskeletal: Negative.    Skin: Negative.    Allergic/Immunologic: Negative.    Neurological: Negative.    Hematological: Negative.    Psychiatric/Behavioral: Negative.          Objective:         ? carvediloL (COREG) 6.25 mg tablet Take one tablet by mouth twice daily. ? cholecalciferol (VITAMIN D) 1,000 units tablet Take 1 capsule by mouth daily.   ? furosemide (LASIX) 40 mg tablet Take one tablet by mouth every morning.   ? GLUCOSAMINE HCL PO Take 1,000 mg by mouth twice daily.   ? lisinopriL (ZESTRIL) 10 mg tablet Take one tablet by mouth daily.   ? meloxicam (MOBIC) 15 mg tablet Take one tablet by mouth daily as needed.   ? metFORMIN (GLUCOPHAGE) 500 mg tablet Take one tablet by mouth daily.   ? montelukast (SINGULAIR) 10 mg tablet Take one tablet by mouth at bedtime daily.   ? pregabalin (LYRICA) 200 mg capsule Take one capsule by mouth twice daily.   ? rivaroxaban (XARELTO) 20 mg tablet Take one tablet by mouth daily with dinner.   ? simvastatin (ZOCOR) 40 mg tablet Take one tablet by mouth at bedtime daily.   ? spironolactone (ALDACTONE) 25 mg tablet Take one tablet by mouth daily. Please call to schedule office visit with Dr Iver Nestle   ? tiZANidine (ZANAFLEX) 2 mg tablet TAKE 1 TO 2 TABLETS THREE TIMES A DAY AS NEEDED   ? traMADol (ULTRAM) 50 mg tablet Take 50 mg by mouth every 8 hours as needed.     Vitals:    12/25/18 1044   BP: 131/82   Pulse: 75   Temp: 36.6 ?C (97.9 ?F)   Weight: 131.1 kg (289 lb)   Height: 177.8 cm (70)   PainSc: Three     Body mass index is 41.47 kg/m?Marland Kitchen     Physical Exam    EXAM:  Constitutional: Blood pressure 131/82, pulse 75, temperature 36.6 ?C (97.9 ?F), height 177.8 cm (70), weight 131.1 kg (289 lb).    alert, cooperative and morbidly obese  Neurologic: Alert and oriented x 3.    Cranial nerves II - XII grossly intact and symmetric.   Eyes:  Pupils equal round and reactive.       Extraocular eye movements intact and full.  Ears:  Pinna appear normal.  Hearing intact to finger rustle.    Moderate cerumen left EAC.  Right EAC occluded.    Left tympanic membrane intact without effusion, retraction or perforation.  Nose:  Dorsum straight.  Nares patent.  On anterior rhinoscopy,  the mucosa is moist.  Anterior turbinates 2+. Septum midline.  No mucopurulent discharge or polyps.  Oral Cavity: Lips without cutaneous lesion.  Oral competence intact.    Dentition is in fair repair.  Oral mucosa pink and moist.    No masses or lesions.      Tongue mobile.  Floor of mouth without mass or lesion.      Oropharynx: No mucosal masses or lesions.  Tonsils absent.    Soft palate elevates symmetrically.  Neck:  Soft, non-tender.  No appreciable lymphadenopathy.    Trachea midline.  Laryngeal landmarks palpable with normal crepitus.    Larynx elevates with swallowing.  Thyrohyoid muscles nontender.    Thyroid without mass or tenderness.  Mobile with swallow.  Respiratory: Breathing comfortably.  No suprasternal/suprclavicular retractions    or accessory muscle use.  No stridor.    Clear to auscultation bilaterally.  Cardiovascular: Regular rate and rhythm.    No murmurs, rubs or gallops.  Abdomen: Soft, non-tender to palpation.    Bowel sounds present.  Psychiatric: Normal mood and affect.  Behavior normal.    VOICE EVALUATION:  Voice is graded G2 R2 B1 A0 S2.  Pitch is appropriate for gender.  Volume is diminished.  Glottal fry is appreciated. Tremor is not noted.  Voice breaks are not appreciated.  Articulation is normal.  Resonance is normal.    STROBOSCOPY:  I have reviewed the videostroboscopy performed by Diamantina Providence, CCC-SLP.  A full, detailed report will follow and can be found under Procedures.  Pertinent findings are as follows:         Adduction           Right    normal        Left     normal  Abduction       Right    normal        Left     normal      Vibratory edge        Right    cyst anterior cord, mild atrophy       Left     mild atrophy  Mucosal wave       Right    decreased anteriorly       Left     normal  Amplitude        Right    normal       Left     normal    Glottal closure    intermittent anterior gap    Phase closure    open    MTD     moderate II       Assessment and Plan:    ASSESSMENT:  Encounter Diagnoses Name Primary?   ? Dysphonia Yes   ? Laryngeal hyperfunction    ? Vocal fold atrophy    ? Vocal fold cyst        PLAN:    I have reviewed my findings with Annie Paras.  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  This really only affects his vibration in his upper register.  He also has some mild atrophy with moderate compensatory hyperfunction.  Options include observation, speech therapy or surgery.  Given that he does have some ongoing medical issues that need further investigation, I would recommend either observation or therapy at this time.  After discussing the risks and benefits of each course  of action, Ms. Guaman is elected for observation.  We will plan for a fail safe appointment in approximately 3 months.  If he wishes to proceed to therapy prior to then, he has my contact information and may call to schedule.

## 2018-12-25 NOTE — Progress Notes
[facility]  Videostroboscopy [phone])  Staff: Alona Bene, M.D.  Date: 12/25/2018       S  Victor Graham was referred for videostroboscopy with a diagnosis of dysphonia. Patient has been experiencing hoarseness for the past year. Of note, patient has previously undergone an AAA repair. There may be some changes in the AAA, and he has f/u with his cardiologist soon. A lesion was recently found in one of his lungs and he is also scheduled to see a Pulmonologist.The purpose of this evaluation is to assess the laryngeal function and to provide recommendations as warranted.     O  EXAM DETAIL  Adduction     Right Normal   Left  Normal   Abduction    Right Normal   Left Normal   Vibratory Edge    Right Smooth   Left Smooth   Mucosal Wave    Right  Normal posteriorly, reduced anteriorly   Left  Normal posteriorly    Amplitude    Right Normal    Left Normal    Glottal Closure Intermittent anterior gap   Phase Closure Normal/Even to slightly open   Periodicity Normal   Phase Symmetry Somewhat Even   MTD Type II and Type III   Ventricular Compression Left > Right   Vertical Level of Approximation Equal   Other Findings Thick white mucus, plica ventricularis        Still Images:   Please find still images attached to the videostroboscopy order in the Procedures section of patient's chart.     A  Prior to this exam, patient's right nare was numbed by Dr. Marisa Sprinkles.     Excrescence noted along the medial edge of the anterior portion of the right true vocal fold. This lesion appeared stiff, and reduced wave in this location. No other masses or lesions noted. Adequate range of motion of the bilateral arytenoid units appreciated. Bowing of the bilateral TVCs appreciated. MTD types II and III observed with sustained phonation and connected speech. Thick, white mucus collected on the bilateral true vocal folds.      EXAM SUMMARY  Examiner Diamantina Providence, CCC-SLP   Diagnostician Alona Bene, MD Primary Diagnosis Laryngeal Hyperfunction   Secondary Diagnosis Vocal Fold Bowing   Tertiary Diagnosis Vocal Fold Cyst   Vocal Quality Raspy, glottal fry   Breath Support Needs Improvement   Resonance Normal   Side of Nose Scope was Passed RIght   Original Pt Complaint Hoarseness      P  Please follow up with Dr. Marisa Sprinkles as directed.    The ENT speech pathology service plans to continue to follow this patient in the future for any potential/ongoing outpatient voice, swallowing, articulation or trismus related care.

## 2018-12-25 NOTE — Progress Notes
ATTESTATION    I have reviewed the information from this visit and agree with the impression and recommendations.    Staff name:  Elberta Spaniel, MD Date:  12/25/2018

## 2018-12-29 ENCOUNTER — Encounter: Admit: 2018-12-29 | Discharge: 2018-12-29 | Payer: MEDICARE

## 2018-12-30 ENCOUNTER — Encounter: Admit: 2018-12-30 | Discharge: 2018-12-30 | Payer: MEDICARE

## 2019-01-06 ENCOUNTER — Encounter

## 2019-01-06 DIAGNOSIS — I2510 Atherosclerotic heart disease of native coronary artery without angina pectoris: Secondary | ICD-10-CM

## 2019-01-06 DIAGNOSIS — J849 Interstitial pulmonary disease, unspecified: Secondary | ICD-10-CM

## 2019-01-06 DIAGNOSIS — I712 Thoracic aortic aneurysm, without rupture: Secondary | ICD-10-CM

## 2019-01-06 DIAGNOSIS — I428 Other cardiomyopathies: Secondary | ICD-10-CM

## 2019-01-06 DIAGNOSIS — I493 Ventricular premature depolarization: Secondary | ICD-10-CM

## 2019-01-06 DIAGNOSIS — I351 Nonrheumatic aortic (valve) insufficiency: Secondary | ICD-10-CM

## 2019-01-06 DIAGNOSIS — R768 Other specified abnormal immunological findings in serum: Secondary | ICD-10-CM

## 2019-01-06 DIAGNOSIS — N529 Male erectile dysfunction, unspecified: Secondary | ICD-10-CM

## 2019-01-06 DIAGNOSIS — M5416 Radiculopathy, lumbar region: Secondary | ICD-10-CM

## 2019-01-06 DIAGNOSIS — M1990 Unspecified osteoarthritis, unspecified site: Secondary | ICD-10-CM

## 2019-01-06 DIAGNOSIS — E669 Obesity, unspecified: Secondary | ICD-10-CM

## 2019-01-06 DIAGNOSIS — G609 Hereditary and idiopathic neuropathy, unspecified: Secondary | ICD-10-CM

## 2019-01-06 DIAGNOSIS — K760 Fatty (change of) liver, not elsewhere classified: Secondary | ICD-10-CM

## 2019-01-06 DIAGNOSIS — E785 Hyperlipidemia, unspecified: Secondary | ICD-10-CM

## 2019-01-06 DIAGNOSIS — I10 Essential (primary) hypertension: Secondary | ICD-10-CM

## 2019-01-06 DIAGNOSIS — R0600 Dyspnea, unspecified: Secondary | ICD-10-CM

## 2019-01-06 DIAGNOSIS — I2699 Other pulmonary embolism without acute cor pulmonale: Secondary | ICD-10-CM

## 2019-01-06 LAB — SED RATE: Lab: 14 mm/h (ref 0–20)

## 2019-01-06 LAB — CBC AND DIFF
Lab: 0 10*3/uL (ref 0–0.20)
Lab: 2 % (ref >60)
Lab: 4.8 M/UL — ABNORMAL HIGH (ref 4.4–5.5)
Lab: 6.3 10*3/uL (ref 4.5–11.0)

## 2019-01-06 LAB — RHEUMATOID FACTOR (RF): Lab: <10 [IU]/mL (ref <25)

## 2019-01-06 LAB — IMMUNOGLOBULINS-IGA,IGG,IGM
Lab: 243 mg/dL (ref 70–390)
Lab: 852 mg/dL — ABNORMAL HIGH (ref 762–1488)

## 2019-01-06 LAB — C REACTIVE PROTEIN (CRP): Lab: 0.8 mg/dL (ref <1.0)

## 2019-01-06 LAB — COMPREHENSIVE METABOLIC PANEL
Lab: 139 MMOL/L (ref 137–147)
Lab: 4.1 MMOL/L — AB (ref 3.5–5.1)

## 2019-01-06 LAB — CREATINE KINASE-CPK: Lab: 255 U/L — ABNORMAL HIGH (ref 35–232)

## 2019-01-06 NOTE — Patient Instructions
Clinic Visit Summary:     Next clinic visit follow up with Dr Hall recommended in 6 months with Pulmonary Function Tests.     Please contact Pulmonary Nurse Coordinator with signs and symptoms of worsening productive cough with thick secretions, blood in sputum, chest tightness/pain, shortness of breath, fever, chills, night sweats, or any questions or concerns.     ILD/Sarcoidosis Clinical Care Coordinators-Jamie Ludwig, RN / Diedra Sinor, RN / Anne Goodman, RN [phone]    For refills on medications, please have your pharmacy fax a refill authorization request form to our office at Fax) [phone]. Please allow at least 3 business days for refill requests.   For urgent issues after business hours/weekends/holidays call [phone] and request for the pulmonary fellow to be paged. For scheduling questions/concerns, please call [phone].

## 2019-01-07 LAB — ANTI SSA ANTI SSB AB

## 2019-01-07 LAB — JO 1 ANTIBODIES

## 2019-01-07 LAB — ANTI-NUCLEAR AB(ANA)-QUANT: Lab: 320 — ABNORMAL HIGH (ref <80)

## 2019-01-07 LAB — CCP IGG ANTIBODY

## 2019-01-07 LAB — ALDOLASE: Lab: 7.4 % (ref 41–77)

## 2019-01-07 LAB — ANTI-NUCLEAR ANTIBODY(ANA)

## 2019-01-07 LAB — MPO/PR3 W REFLEX TO ANCA

## 2019-01-07 LAB — SCL 70 ANTIBODIES

## 2019-01-08 ENCOUNTER — Encounter: Admit: 2019-01-08 | Discharge: 2019-01-08 | Payer: MEDICARE

## 2019-01-08 DIAGNOSIS — I493 Ventricular premature depolarization: Secondary | ICD-10-CM

## 2019-01-08 DIAGNOSIS — E785 Hyperlipidemia, unspecified: Secondary | ICD-10-CM

## 2019-01-08 DIAGNOSIS — I10 Essential (primary) hypertension: Secondary | ICD-10-CM

## 2019-01-08 DIAGNOSIS — N529 Male erectile dysfunction, unspecified: Secondary | ICD-10-CM

## 2019-01-08 DIAGNOSIS — K760 Fatty (change of) liver, not elsewhere classified: Secondary | ICD-10-CM

## 2019-01-08 DIAGNOSIS — G609 Hereditary and idiopathic neuropathy, unspecified: Secondary | ICD-10-CM

## 2019-01-08 DIAGNOSIS — R768 Other specified abnormal immunological findings in serum: Secondary | ICD-10-CM

## 2019-01-08 DIAGNOSIS — M1990 Unspecified osteoarthritis, unspecified site: Secondary | ICD-10-CM

## 2019-01-08 DIAGNOSIS — I2510 Atherosclerotic heart disease of native coronary artery without angina pectoris: Secondary | ICD-10-CM

## 2019-01-08 DIAGNOSIS — I351 Nonrheumatic aortic (valve) insufficiency: Secondary | ICD-10-CM

## 2019-01-08 DIAGNOSIS — E669 Obesity, unspecified: Secondary | ICD-10-CM

## 2019-01-08 DIAGNOSIS — I712 Thoracic aortic aneurysm, without rupture: Secondary | ICD-10-CM

## 2019-01-08 DIAGNOSIS — I428 Other cardiomyopathies: Secondary | ICD-10-CM

## 2019-01-08 DIAGNOSIS — M5416 Radiculopathy, lumbar region: Secondary | ICD-10-CM

## 2019-01-08 DIAGNOSIS — I2699 Other pulmonary embolism without acute cor pulmonale: Secondary | ICD-10-CM

## 2019-01-08 LAB — HYPERSENSITIVITY PNEUMONITIS IGG
Lab: 16 pg (ref 26–34)
Lab: 2.6 ug/mL (ref 7–11)
Lab: 5.3 % (ref 24–44)

## 2019-01-09 ENCOUNTER — Encounter: Admit: 2019-01-09 | Discharge: 2019-01-09 | Payer: MEDICARE

## 2019-01-09 DIAGNOSIS — I712 Thoracic aortic aneurysm, without rupture: Secondary | ICD-10-CM

## 2019-01-09 DIAGNOSIS — E785 Hyperlipidemia, unspecified: Secondary | ICD-10-CM

## 2019-01-09 DIAGNOSIS — I2699 Other pulmonary embolism without acute cor pulmonale: Secondary | ICD-10-CM

## 2019-01-09 DIAGNOSIS — I428 Other cardiomyopathies: Secondary | ICD-10-CM

## 2019-01-09 DIAGNOSIS — E669 Obesity, unspecified: Secondary | ICD-10-CM

## 2019-01-09 DIAGNOSIS — I10 Essential (primary) hypertension: Secondary | ICD-10-CM

## 2019-01-09 DIAGNOSIS — K760 Fatty (change of) liver, not elsewhere classified: Secondary | ICD-10-CM

## 2019-01-09 DIAGNOSIS — N529 Male erectile dysfunction, unspecified: Secondary | ICD-10-CM

## 2019-01-09 DIAGNOSIS — R768 Other specified abnormal immunological findings in serum: Secondary | ICD-10-CM

## 2019-01-09 DIAGNOSIS — M5416 Radiculopathy, lumbar region: Secondary | ICD-10-CM

## 2019-01-09 DIAGNOSIS — I493 Ventricular premature depolarization: Secondary | ICD-10-CM

## 2019-01-09 DIAGNOSIS — M1990 Unspecified osteoarthritis, unspecified site: Secondary | ICD-10-CM

## 2019-01-09 DIAGNOSIS — G609 Hereditary and idiopathic neuropathy, unspecified: Secondary | ICD-10-CM

## 2019-01-09 DIAGNOSIS — I351 Nonrheumatic aortic (valve) insufficiency: Secondary | ICD-10-CM

## 2019-01-09 DIAGNOSIS — I2510 Atherosclerotic heart disease of native coronary artery without angina pectoris: Secondary | ICD-10-CM

## 2019-01-16 ENCOUNTER — Encounter: Admit: 2019-01-16 | Discharge: 2019-01-16 | Payer: MEDICARE

## 2019-01-20 ENCOUNTER — Encounter: Admit: 2019-01-20 | Discharge: 2019-01-20 | Payer: MEDICARE

## 2019-01-20 MED ORDER — PREGABALIN 200 MG PO CAP
200 mg | ORAL_CAPSULE | Freq: Two times a day (BID) | ORAL | 1 refills | Status: AC
Start: 2019-01-20 — End: ?

## 2019-01-20 NOTE — Telephone Encounter
Patient requesting refill of lyrica   LOV: 11/20/2018  UOV: 01/22/2019

## 2019-01-22 ENCOUNTER — Ambulatory Visit: Admit: 2019-01-22 | Discharge: 2019-01-23 | Payer: MEDICARE

## 2019-01-22 ENCOUNTER — Encounter: Admit: 2019-01-22 | Discharge: 2019-01-22 | Payer: MEDICARE

## 2019-01-22 DIAGNOSIS — I428 Other cardiomyopathies: Secondary | ICD-10-CM

## 2019-01-22 DIAGNOSIS — E785 Hyperlipidemia, unspecified: Secondary | ICD-10-CM

## 2019-01-22 DIAGNOSIS — I351 Nonrheumatic aortic (valve) insufficiency: Secondary | ICD-10-CM

## 2019-01-22 DIAGNOSIS — M792 Neuralgia and neuritis, unspecified: Principal | ICD-10-CM

## 2019-01-22 DIAGNOSIS — N529 Male erectile dysfunction, unspecified: Secondary | ICD-10-CM

## 2019-01-22 DIAGNOSIS — R768 Other specified abnormal immunological findings in serum: Secondary | ICD-10-CM

## 2019-01-22 DIAGNOSIS — I10 Essential (primary) hypertension: Secondary | ICD-10-CM

## 2019-01-22 DIAGNOSIS — G609 Hereditary and idiopathic neuropathy, unspecified: Secondary | ICD-10-CM

## 2019-01-22 DIAGNOSIS — I2510 Atherosclerotic heart disease of native coronary artery without angina pectoris: Secondary | ICD-10-CM

## 2019-01-22 DIAGNOSIS — M5416 Radiculopathy, lumbar region: Secondary | ICD-10-CM

## 2019-01-22 DIAGNOSIS — M1990 Unspecified osteoarthritis, unspecified site: Secondary | ICD-10-CM

## 2019-01-22 DIAGNOSIS — I712 Thoracic aortic aneurysm, without rupture: Secondary | ICD-10-CM

## 2019-01-22 DIAGNOSIS — E669 Obesity, unspecified: Secondary | ICD-10-CM

## 2019-01-22 DIAGNOSIS — I2699 Other pulmonary embolism without acute cor pulmonale: Secondary | ICD-10-CM

## 2019-01-22 DIAGNOSIS — K760 Fatty (change of) liver, not elsewhere classified: Secondary | ICD-10-CM

## 2019-01-22 DIAGNOSIS — I493 Ventricular premature depolarization: Secondary | ICD-10-CM

## 2019-01-22 NOTE — Progress Notes
[facility] CLINIC NOTE       Obtained patient's verbal consent to treat them and their agreement to [facility] financial policy and NPP via this telehealth visit during the Coronavirus Public Health Emergency    SUBJECTIVE: [name] presented in follow up for care via Zoom telehealth conference for pain in the left gluteal region and scrotum. The pain is intermittently sharp and dull and accompanied by numbness and paresthesias. Walking and standing exacerbates the pain.  For pain relief he is taking tizanidine 2 mg 2-3 times per day, Lyrica 200 mg twice per day and tramadol 50 mg up to 3 times per day.  On this regimen his pain is moderate.  He has surgical biopsy scheduled for vocal cord tumor and assessment of carpal tunnel syndrome pending.         Review of Systems   Constitutional: Negative.    HENT: Positive for voice change.    Endocrine: Positive for polyuria.   Genitourinary: Positive for frequency and urgency.   Musculoskeletal: Positive for arthralgias, back pain, gait problem and myalgias.   Neurological: Positive for numbness.   All other systems reviewed and are negative.      Current Outpatient Medications:   ?  carvediloL (COREG) 6.25 mg tablet, Take one tablet by mouth twice daily., Disp: 180 tablet, Rfl: 3  ?  cholecalciferol (VITAMIN D) 1,000 units tablet, Take 1 capsule by mouth daily., Disp: , Rfl:   ?  furosemide (LASIX) 40 mg tablet, Take one tablet by mouth every morning., Disp: 90 tablet, Rfl: 3  ?  GLUCOSAMINE HCL PO, Take 1,000 mg by mouth twice daily., Disp: , Rfl:   ?  lisinopriL (ZESTRIL) 10 mg tablet, Take one tablet by mouth daily., Disp: 90 tablet, Rfl: 2  ?  metFORMIN (GLUCOPHAGE) 500 mg tablet, Take one tablet by mouth daily., Disp: 90 tablet, Rfl: 1  ?  pregabalin (LYRICA) 200 mg capsule, Take one capsule by mouth twice daily for 30 days., Disp: 180 capsule, Rfl: 1  ?  rivaroxaban (XARELTO) 20 mg tablet, Take one tablet by mouth daily with dinner., Disp: 90 tablet, Rfl: 3 ?  simvastatin (ZOCOR) 40 mg tablet, Take one tablet by mouth at bedtime daily., Disp: 90 tablet, Rfl: 1  ?  spironolactone (ALDACTONE) 25 mg tablet, Take one tablet by mouth daily. Please call to schedule office visit with Dr Iver Nestle, Disp: 90 tablet, Rfl: 3  ?  tiZANidine (ZANAFLEX) 2 mg tablet, TAKE 1 TO 2 TABLETS THREE TIMES A DAY AS NEEDED, Disp: 90 tablet, Rfl: 3  ?  traMADol (ULTRAM) 50 mg tablet, Take 50 mg by mouth every 8 hours as needed., Disp: , Rfl:   Allergies   Allergen Reactions   ? Other [Unclassified Drug] BLISTERS     dissolving stiches in mouth after dental surgery caused blisters   ? Lipitor [Atorvastatin] FLUSHING (SKIN)   ? Niaspan [Niacin] FLUSHING (SKIN)   ? Rubber SEE COMMENTS     Neoprene rubber causes contact dermatitis (CAUSED BY THIOUREA IN NEOPRENE)     Physical Exam  Vitals:    01/22/19 0820   Weight: 127.9 kg (282 lb)   Height: 177.8 cm (70)   PainSc: Four     Oswestry Total Score:: 56  Pain Score: Four  Body mass index is 40.46 kg/m?Marland Kitchen  General: Alert and oriented, very pleasant male.   HEENT showed extraocular muscles were intact and no other abnormalities.  Unlabored breathing.  Regular rate and rhythm on CV  exam.   There is tenderness to palpation in the left gluteal region         IMPRESSION:  1. Neuralgia          PLAN: Well schedule a clinic follow-up for left pudendal nerve block to address left gluteal and scrotal pain.    Visit Start Time 08:11 Visit End Time 08:24.

## 2019-02-12 ENCOUNTER — Encounter: Admit: 2019-02-12 | Discharge: 2019-02-12 | Payer: MEDICARE

## 2019-02-17 ENCOUNTER — Encounter: Admit: 2019-02-17 | Discharge: 2019-02-17 | Payer: MEDICARE

## 2019-02-18 ENCOUNTER — Encounter: Admit: 2019-02-18 | Discharge: 2019-02-18 | Payer: MEDICARE

## 2019-02-18 DIAGNOSIS — I493 Ventricular premature depolarization: Secondary | ICD-10-CM

## 2019-02-18 DIAGNOSIS — I10 Essential (primary) hypertension: Secondary | ICD-10-CM

## 2019-02-18 DIAGNOSIS — I351 Nonrheumatic aortic (valve) insufficiency: Secondary | ICD-10-CM

## 2019-02-18 DIAGNOSIS — N401 Enlarged prostate with lower urinary tract symptoms: Secondary | ICD-10-CM

## 2019-02-18 DIAGNOSIS — I428 Other cardiomyopathies: Secondary | ICD-10-CM

## 2019-02-18 DIAGNOSIS — N529 Male erectile dysfunction, unspecified: Secondary | ICD-10-CM

## 2019-02-18 DIAGNOSIS — E785 Hyperlipidemia, unspecified: Secondary | ICD-10-CM

## 2019-02-18 DIAGNOSIS — M1990 Unspecified osteoarthritis, unspecified site: Secondary | ICD-10-CM

## 2019-02-18 DIAGNOSIS — R768 Other specified abnormal immunological findings in serum: Secondary | ICD-10-CM

## 2019-02-18 DIAGNOSIS — K760 Fatty (change of) liver, not elsewhere classified: Secondary | ICD-10-CM

## 2019-02-18 DIAGNOSIS — G609 Hereditary and idiopathic neuropathy, unspecified: Secondary | ICD-10-CM

## 2019-02-18 DIAGNOSIS — E669 Obesity, unspecified: Secondary | ICD-10-CM

## 2019-02-18 DIAGNOSIS — I2510 Atherosclerotic heart disease of native coronary artery without angina pectoris: Secondary | ICD-10-CM

## 2019-02-18 DIAGNOSIS — I712 Thoracic aortic aneurysm, without rupture: Secondary | ICD-10-CM

## 2019-02-18 DIAGNOSIS — I2699 Other pulmonary embolism without acute cor pulmonale: Secondary | ICD-10-CM

## 2019-02-18 DIAGNOSIS — M5416 Radiculopathy, lumbar region: Secondary | ICD-10-CM

## 2019-02-18 NOTE — Progress Notes
Subjective:       History of Present Illness  Victor Graham is a 68 y.o. male.    Chief Complaint:  Follow Up and Benign Prostatic Hypertrophy  Victor Graham is a very pleasant 68 year old white male with a history of progressive bladder outlet obstructive symptoms including decrease in the force and caliber of his urinary stream, urgency, frequency, and occasional urge incontinence.  He has had a history of testicular pain and was seen in the urology department many years ago.  He was given a trial of Rapaflo at that time which did not improve his testicular discomfort.  At that time he is not having any flow issues according to him.  His most recent PSA September 12, 2018 was 1.63.  His father was diagnosed with prostate cancer (positive family history).       Review of Systems   Constitutional: Negative for chills, fatigue and fever.   HENT: Negative for hearing loss.    Eyes: Negative for visual disturbance.   Respiratory: Negative for shortness of breath.    Cardiovascular: Negative for chest pain and leg swelling.   Gastrointestinal: Negative for abdominal pain.   Genitourinary: Positive for frequency and urgency.   Musculoskeletal: Negative for back pain.   Skin: Negative for rash.   Neurological: Negative for weakness.   Psychiatric/Behavioral: Negative for confusion.         Objective:         ? carvediloL (COREG) 6.25 mg tablet Take one tablet by mouth twice daily.   ? cholecalciferol (VITAMIN D) 1,000 units tablet Take 1 capsule by mouth daily.   ? furosemide (LASIX) 40 mg tablet Take one tablet by mouth every morning.   ? GLUCOSAMINE HCL PO Take 1,000 mg by mouth twice daily.   ? lisinopriL (ZESTRIL) 10 mg tablet Take one tablet by mouth daily.   ? metFORMIN (GLUCOPHAGE) 500 mg tablet Take one tablet by mouth daily.   ? pregabalin (LYRICA) 200 mg capsule Take one capsule by mouth twice daily for 30 days.   ? rivaroxaban (XARELTO) 20 mg tablet Take one tablet by mouth daily with dinner. ? simvastatin (ZOCOR) 40 mg tablet Take one tablet by mouth at bedtime daily.   ? spironolactone (ALDACTONE) 25 mg tablet Take one tablet by mouth daily. Please call to schedule office visit with Dr Iver Nestle   ? tiZANidine (ZANAFLEX) 2 mg tablet TAKE 1 TO 2 TABLETS THREE TIMES A DAY AS NEEDED   ? traMADol (ULTRAM) 50 mg tablet Take 50 mg by mouth every 8 hours as needed.     Vitals:    02/18/19 1111   BP: 120/70   BP Source: Arm, Left Upper   Patient Position: Sitting   Pulse: 84   Resp: 18   Temp: 36.2 ?C (97.1 ?F)   SpO2: 97%   Weight: 131.1 kg (289 lb)   Height: 177.8 cm (70)   PainSc: Three     Body mass index is 41.47 kg/m?Marland Kitchen     Physical Exam  Constitutional:       Appearance: Normal appearance.   HENT:      Head: Normocephalic and atraumatic.   Eyes:      General:         Right eye: No discharge.         Left eye: No discharge.   Neck:      Musculoskeletal: Normal range of motion.   Cardiovascular:      Rate and Rhythm: Normal rate.  Pulmonary:      Effort: No respiratory distress.   Abdominal:      General: Bowel sounds are normal. There is no distension.      Palpations: Abdomen is soft.      Tenderness: There is no abdominal tenderness.   Skin:     General: Skin is warm.      Findings: No rash.   Neurological:      Mental Status: He is alert and oriented to person, place, and time.              Assessment and Plan:  Lower urinary tract symptoms secondary to currently BPH? we had a long discussion today regarding treatment options for BPH including over-the-counter supplements, alpha blockers, and 5 alpha reductase inhibitors.  Since he is having early symptoms and does take numerous other medications and therefore he would like to remain conservative and begin a saw palmetto-based product.  If he is still having issues I told him to contact us and we can begin Flomax 0.4 mg nightly.

## 2019-02-19 ENCOUNTER — Ambulatory Visit: Admit: 2019-02-19 | Discharge: 2019-02-19 | Payer: MEDICARE

## 2019-02-19 ENCOUNTER — Encounter: Admit: 2019-02-19 | Discharge: 2019-02-19 | Payer: MEDICARE

## 2019-02-19 DIAGNOSIS — R9389 Abnormal findings on diagnostic imaging of other specified body structures: Secondary | ICD-10-CM

## 2019-02-19 DIAGNOSIS — Z9889 Other specified postprocedural states: Secondary | ICD-10-CM

## 2019-02-19 LAB — POC CREATININE, RAD: Lab: 0.8 mg/dL (ref 0.4–1.24)

## 2019-02-19 MED ORDER — IOHEXOL 350 MG IODINE/ML IV SOLN
75 mL | Freq: Once | INTRAVENOUS | 0 refills | Status: CP
Start: 2019-02-19 — End: ?
  Administered 2019-02-19: 18:00:00 75 mL via INTRAVENOUS

## 2019-02-19 MED ORDER — SODIUM CHLORIDE 0.9 % IJ SOLN
50 mL | Freq: Once | INTRAVENOUS | 0 refills | Status: CP
Start: 2019-02-19 — End: ?
  Administered 2019-02-19: 18:00:00 50 mL via INTRAVENOUS

## 2019-02-24 ENCOUNTER — Encounter: Admit: 2019-02-24 | Discharge: 2019-02-24 | Payer: MEDICARE

## 2019-02-24 ENCOUNTER — Ambulatory Visit: Admit: 2019-02-24 | Discharge: 2019-02-25 | Payer: MEDICARE

## 2019-02-24 DIAGNOSIS — M5416 Radiculopathy, lumbar region: Secondary | ICD-10-CM

## 2019-02-24 DIAGNOSIS — K760 Fatty (change of) liver, not elsewhere classified: Secondary | ICD-10-CM

## 2019-02-24 DIAGNOSIS — E669 Obesity, unspecified: Secondary | ICD-10-CM

## 2019-02-24 DIAGNOSIS — E785 Hyperlipidemia, unspecified: Secondary | ICD-10-CM

## 2019-02-24 DIAGNOSIS — I712 Thoracic aortic aneurysm, without rupture: Secondary | ICD-10-CM

## 2019-02-24 DIAGNOSIS — M1990 Unspecified osteoarthritis, unspecified site: Secondary | ICD-10-CM

## 2019-02-24 DIAGNOSIS — I428 Other cardiomyopathies: Secondary | ICD-10-CM

## 2019-02-24 DIAGNOSIS — I10 Essential (primary) hypertension: Secondary | ICD-10-CM

## 2019-02-24 DIAGNOSIS — I493 Ventricular premature depolarization: Secondary | ICD-10-CM

## 2019-02-24 DIAGNOSIS — I351 Nonrheumatic aortic (valve) insufficiency: Secondary | ICD-10-CM

## 2019-02-24 DIAGNOSIS — I2510 Atherosclerotic heart disease of native coronary artery without angina pectoris: Secondary | ICD-10-CM

## 2019-02-24 DIAGNOSIS — N529 Male erectile dysfunction, unspecified: Secondary | ICD-10-CM

## 2019-02-24 DIAGNOSIS — R768 Other specified abnormal immunological findings in serum: Secondary | ICD-10-CM

## 2019-02-24 DIAGNOSIS — G609 Hereditary and idiopathic neuropathy, unspecified: Secondary | ICD-10-CM

## 2019-02-24 DIAGNOSIS — I2699 Other pulmonary embolism without acute cor pulmonale: Secondary | ICD-10-CM

## 2019-02-24 MED ORDER — ROSUVASTATIN 20 MG PO TAB
20 mg | ORAL_TABLET | Freq: Every day | ORAL | 3 refills | 90.00000 days | Status: AC
Start: 2019-02-24 — End: ?

## 2019-02-24 NOTE — Progress Notes
Date of Service: 02/24/2019    Victor Graham is a 68 y.o. male.       HPI    Victor Graham is a 68 year old gentleman, coming in for a follow-up regarding his mild to moderate coronary disease, essential hypertension, dyslipidemia and prior history of an ascending aortic aneurysm repair.    Routine noncontrast CT of the chest done in August was consistent with an ascending thoracic aortic aneurysm repair.  An intraluminal and curvilinear calcification along the anterior aspect of the graft was reported.  CTA was recommended.  He underwent a CTA in August and they reported a small focal dissection flap originating from the ascending aortic graft, not involving the native thoracic aorta.  A repeat was suggested in 3 months.  We obtained a repeat CTA of the chest a couple of days ago that shows that the focal dissection flap reported before and has been stable.  The patient does not mention any discomfort in the chest.  He is compliant with all his medications.  The blood pressures and heart rates have been well controlled.  The colonoscopy has been put on hold.  The left carpal tunnel surgery is also postponed for now.    The echo done in 2019 had shown EF of 55%.  A stress test done in January of this year had shown a small area of mixed apical?mid inferolateral perfusion abnormality in the circumflex territory.    Exertional capacity is limited due to radicular pain.    There is prior history of unprovoked DVT and PE and has h/o positive lupus anticoagulant-thus he is on prolonged anticoagulation.  There is also family history of DVT.  ?             Vitals:    02/24/19 0845   BP: 98/68   BP Source: Arm, Left Upper   Pulse: 72   Weight: 131.4 kg (289 lb 11.2 oz)   Height: 1.778 m (5' 10)   PainSc: Zero     Body mass index is 41.57 kg/m?Marland Kitchen     Past Medical History  Patient Active Problem List    Diagnosis Date Noted   ? 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 Lumbar radiculopathy 09/30/2015 ? 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     09/2015 Mild on prior assessments     ? Pulmonary embolism (HCC) 09/17/2014     12-09-13: 2D Echo for PE: Technically difficult study: EF~55%. In comparison to previous echocardiogram from 09-2013 there is no significant difference.    Noted incidentally on a CT done in 10/2013 for monitoring the asc aortic aneurysm. Multiple emboli were reported along with suggestion of RV strain. Has a FH of DVT (brother & mother)      ? 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 Started Rapaflo 8mg   01/26/14 - LUTS much improved. Continue Rapaflo 8mg   02/15/15 - stable LUTS      08/16/15- Minimal nocturia and no other symptoms. Interested in trialing discontinuation of his Rapaflo. He will call if his symptoms worsen and we can refill his medications over the phone.     08/14/16 - symptoms stable, PVR 64mL      ? Class 3 severe obesity due to excess calories with serious comorbidity and body mass index (BMI) of 40.0 to 44.9 in adult [facility]) 10/28/2013   ? Cardiomyopathy, idiopathic (HCC) 06/06/2009     4/11 repeat echo EF 45%, mild AI  2/11 echo  from PCPs office EF 35%. New finding compared to prior EF 45-50% in '09 (by echo at [facility]), LV gram from '09 reviewed- EF 45%     ? Status post thoracic aortic aneurysm repair 07/12/2008     3/10-   30 mm Hemashield graft used to replace the ascending aorta     ? Hypertension 01/08/2008     09-17-14: Well controlled. Taking Lisinopril 10 mg daily. Coreg 25 mg 0.5 tablet BID  Well controlled     ? HLD (hyperlipidemia) 01/08/2008     10-08-14: TC 164, HDL 35, TG 237. On Zocor 20 mg  07-07-14: TC 209, LDL 234, HDL 45, TG 234  11/14 TC 153, LDL 74, HDL 45, TG 172  1/14 good numbers  5/13 TC 152, LDL 76, HDL 39, TG 183  2/11 TC 147, LDL 81, HDL 42, TG 138  Panel checked in 6/10- TC 144, LDL 87, HDL 36, TG 131, on simva 40 qhs, intolerant of niacin     ? CAD (coronary artery disease) 01/08/2008 Mild to moderate, non-obstructive by angio in 10/09 done following an abnormal stress thallium            Review of Systems   Constitution: Negative.   HENT: Negative.    Eyes: Negative.    Cardiovascular: Positive for dyspnea on exertion.   Respiratory: Negative.    Endocrine: Negative.    Hematologic/Lymphatic: Negative.    Skin: Negative.    Gastrointestinal: Negative.    Genitourinary: Negative.    Neurological: Negative.    Psychiatric/Behavioral: Negative.    Allergic/Immunologic: Negative.        Physical Exam  General Appearance: well for age, appears comfortable  Skin: warm, no ulcers, no xanthomas  Eyes/lids: no conjunctival pallor, no arcus, no xanthelasma  Lips/oral mucosa: no cyanosis  Neck: neck veins flat  Carotids: normal upstroke, no bruit  Chest: normal appearance  Lungs: clear  Cardiac rhythm: regular rhythm & normal rate  Cardiac auscultation: Normal S1 & S2, no S3 or S4, no murmur  Abdomen: soft, non tender, bowel sounds normal, no pulsations/bruits  Extremities: no LE edema, no varicosities, 2+ distal pulses  Neurologic/psych: oriented, no gross motor deficits, normal gait, normal mood & affect           Problems Addressed Today  Encounter Diagnoses   Name Primary?   ? Thoracic aortic aneurysm without rupture (HCC) Yes   ? Hyperlipidemia, unspecified hyperlipidemia type        Assessment and Plan    ??In summary, Victor Graham is a 68 year old gentleman with the following cardiac and related issues:  ??  1. History of mild nonischemic cardiomyopathy,?presently normalized (echo showed normal EF but gated EF on the stress test was 45%). ?????  2. Hypertension,?controlled  3. History of unprovoked PE,?with positive family history DVT and positive lupus anticoagulant,?to be maintained on long-term Xarelto.   4. Dyslipidemia-? on simva,??the recent LDL was?100.   5. PVCs- asymptomatic, on beta-blockers.??? 6. History of ascending aortic aneurysm repair?in 2010.    According to the report, the repeat CTA is not very different from the one done in August.  I requested Dr Helen Hashimoto of [facility] to review the CTA (he had done the original repair).  7. AI-?not much reported on the last echo in 2019.   8. Atypical chest pains,  minor?recently done stress test had shown a small area of inferolateral perfusion abnormality felt to be indicative of a mix of injury and ischemia in the face  of severe soft tissue attenuation. I do not think angiography is warranted.  ??  ?  It was a pleasure to see Victor Graham in the clinic today.???           Current Medications (including today's revisions)  ? carvediloL (COREG) 6.25 mg tablet Take one tablet by mouth twice daily.   ? cholecalciferol (VITAMIN D) 1,000 units tablet Take 1 capsule by mouth daily.   ? furosemide (LASIX) 40 mg tablet Take one tablet by mouth every morning.   ? GLUCOSAMINE HCL PO Take 1,000 mg by mouth twice daily.   ? lisinopriL (ZESTRIL) 10 mg tablet Take one tablet by mouth daily.   ? metFORMIN (GLUCOPHAGE) 500 mg tablet Take one tablet by mouth daily.   ? rivaroxaban (XARELTO) 20 mg tablet Take one tablet by mouth daily with dinner.   ? rosuvastatin (CRESTOR) 20 mg tablet Take one tablet by mouth daily.   ? spironolactone (ALDACTONE) 25 mg tablet Take one tablet by mouth daily. Please call to schedule office visit with Dr Iver Nestle   ? tiZANidine (ZANAFLEX) 2 mg tablet TAKE 1 TO 2 TABLETS THREE TIMES A DAY AS NEEDED   ? traMADol (ULTRAM) 50 mg tablet Take 50 mg by mouth every 8 hours as needed.

## 2019-02-24 NOTE — Patient Instructions
STOP taking your simvastatin    START taking crestor 20 mg daily    Check your cholesterol in 3 months, we will mail a lab order     Dr. Curly Shores would like to see you in 6 months

## 2019-02-25 DIAGNOSIS — I2510 Atherosclerotic heart disease of native coronary artery without angina pectoris: Secondary | ICD-10-CM

## 2019-03-03 ENCOUNTER — Encounter: Admit: 2019-03-03 | Discharge: 2019-03-03 | Payer: MEDICARE

## 2019-03-06 ENCOUNTER — Encounter: Admit: 2019-03-06 | Discharge: 2019-03-06 | Payer: MEDICARE

## 2019-03-11 ENCOUNTER — Encounter: Admit: 2019-03-11 | Discharge: 2019-03-11 | Payer: MEDICARE

## 2019-03-11 NOTE — Progress Notes
03/11/2019 3:39 PM  Received request for cardiac clearance from Honor Loh, LPN from   [facility]   [address], [place] [address]    Fax # [phone].    Faxed cardiac clearance letter from [facility] to [employer].

## 2019-03-12 ENCOUNTER — Encounter: Admit: 2019-03-12 | Discharge: 2019-03-12 | Payer: MEDICARE

## 2019-03-12 ENCOUNTER — Ambulatory Visit: Admit: 2019-03-12 | Discharge: 2019-03-12 | Payer: MEDICARE

## 2019-03-12 DIAGNOSIS — Z20828 Contact with and (suspected) exposure to other viral communicable diseases: Secondary | ICD-10-CM

## 2019-03-12 DIAGNOSIS — E669 Obesity, unspecified: Secondary | ICD-10-CM

## 2019-03-12 DIAGNOSIS — Z1211 Encounter for screening for malignant neoplasm of colon: Secondary | ICD-10-CM

## 2019-03-12 DIAGNOSIS — R768 Other specified abnormal immunological findings in serum: Secondary | ICD-10-CM

## 2019-03-12 DIAGNOSIS — M5416 Radiculopathy, lumbar region: Secondary | ICD-10-CM

## 2019-03-12 DIAGNOSIS — G609 Hereditary and idiopathic neuropathy, unspecified: Secondary | ICD-10-CM

## 2019-03-12 DIAGNOSIS — I712 Thoracic aortic aneurysm, without rupture: Secondary | ICD-10-CM

## 2019-03-12 DIAGNOSIS — N529 Male erectile dysfunction, unspecified: Secondary | ICD-10-CM

## 2019-03-12 DIAGNOSIS — K760 Fatty (change of) liver, not elsewhere classified: Secondary | ICD-10-CM

## 2019-03-12 DIAGNOSIS — I428 Other cardiomyopathies: Secondary | ICD-10-CM

## 2019-03-12 DIAGNOSIS — I2699 Other pulmonary embolism without acute cor pulmonale: Secondary | ICD-10-CM

## 2019-03-12 DIAGNOSIS — I10 Essential (primary) hypertension: Secondary | ICD-10-CM

## 2019-03-12 DIAGNOSIS — E785 Hyperlipidemia, unspecified: Secondary | ICD-10-CM

## 2019-03-12 DIAGNOSIS — M1990 Unspecified osteoarthritis, unspecified site: Secondary | ICD-10-CM

## 2019-03-12 DIAGNOSIS — I351 Nonrheumatic aortic (valve) insufficiency: Secondary | ICD-10-CM

## 2019-03-12 DIAGNOSIS — I2510 Atherosclerotic heart disease of native coronary artery without angina pectoris: Secondary | ICD-10-CM

## 2019-03-12 DIAGNOSIS — I493 Ventricular premature depolarization: Secondary | ICD-10-CM

## 2019-03-12 MED ORDER — SUPREP BOWEL PREP KIT 17.5-3.13-1.6 GRAM PO SOLR
1 | ORAL | 0 refills | Status: DC | PRN
Start: 2019-03-12 — End: 2019-07-07

## 2019-03-12 NOTE — Pre-Anesthesia Patient Instructions
SPLIT DOSE SUPREP - DO NOT TAKE if you have Renal (Kidney) issues  PATIENT PREPARATION INSTRUCTIONS - COLONOSCOPY      Choyce,     You are scheduled for a Colonoscopy on: 03/18/2019  at: 12:00 PM   With Dr. Lenor Derrick, MD  You must arrive by:  11:00AM     ** Please keep in mind that our surgery schedule is fluid, and affected by multiple factors, so it is possible that your procedure time could change.  We will make every effort to maintain your original procedure time, but there are no guarantees. Thank you for your patience and understanding!  **      OUR ADDRESS IS [address] [place], [place] [address]  WE ARE LOCATED AT JUST OFF [address]   WE ARE [place] THE [address] [place] [address]  ONCE YOU'RE ON THE CAMPUS FOLLOW THE SIGNS TO THE SURGERY CENTER LOCATED ON THE EAST SIDE OF THE CAMPUS PARK AND ENTER THROUGH THE SURGERY CENTER DOORS.    To Reschedule call: [phone]  For Questions call: [phone]    ____________________________________________________________________________________    A colonoscopy is a scope examination of your colon or large intestine.  This is about (6) feet long in most adults.                         - A thin, flexible scope with a light and lens will be inserted through your rectum into your colon.  - The doctor will guide the scope along your colon to the junction of the large and small intestines.  - The test usually takes (30) minutes.  - Unless the test is prescribed for other reasons, the main purpose of the colonoscopy is to search for polyps.  - A colonoscopy can be used to screen for cancer or precancerous polyps.  It can also be used to assess the large intestine for various other diseases.  - If biopsies are obtained during the procedure, you will be notified of the results in (14) days.  - We will sedate you for the test by giving you medicines through an IV that will help you to relax and sleep.  We will keep you after the exam for (30) minutes. - The doctor will speak to you before and after the procedure.  - Unless you request otherwise, we will invite your driver to the recovery room to hear the doctor's post-exam report.  - The colon must be completely clean for the procedure to be both accurate and comprehensive. Please follow the preparation instructions carefully.  ______________________________________________________________________________________________________________________________  Instructions: Fill your prescription at least (3) days before your scheduled procedure time.    1 WEEK PRIOR:   1. Stop taking iron supplements (including multivitamins containing iron.)   2. Do not eat any foods containing nuts, seeds, or kernels (for example: sunflower seeds or popcorn).  3. Discuss diabetic medications and insulin with the prescribing physician.     5 DAYS PRIOR:  1. Check with your prescribing physician for instructions about stopping your blood thinner.  Examples of blood thinners are Aleve, Aspirin, Coumadin, Eliquis, Ibuprofen, Naproxen, Plavix and Xarelto.  2. Do not give yourself a Lovenox injection the morning of the test.  Lovenox injections may be taken as usual the day before the test.    4 DAYS PRIOR  1. Stop taking Metamucil, Perdiem, Citrucel, or any other bulk laxatives. Miralax is okay.  DAY PRIOR:   1. Beginning in the morning, start a clear liquid diet. Drink a generous amount of clear liquids throughout the day, no alcohol.  If you are on a fluid restriction, drink the recommended amount of clear liquids allowed by your physician.   No solid or creamed/pureed foods.    Clear Liquid Diet (Avoid all items with RED, PURPLE or ORANGE coloring)  *Water  *Gatorade or sports drink- No red, purple or orange     *White Grape Juice    *Coffee or Tea without Cream  *Tea     *Apple Juice     *Soda Pop (all are ok)   *Green or Yellow Popsicles   *Beef Bouillon or Broth (No Noodles)  *Chicken Bouillon or Broth (No Noodles) *Hard Candy Special educational needs teacher, Life Saver, Mint or Gum)    2. At 5:00 P.M. Mix (1) 6-ounce bottle of Suprep liquid into the mixing container.  Add drinking water to the 16-ounce line on the container and mix.  Drink all the liquid in the container.  You MUST drink (2) more 16-ounce containers of WATER over the next hour.  Continue with additional clear liquids as you wish.    DAY OF TEST:  1. Continue clear liquid diet  2. Five hours before your scheduled procedure time, mix (1) 6- ounce bottle of Suprep liquid into the mixing container. Add drinking water to the 16-ounce line on the container and mix.  Drink all the liquid in the container. You MUST drink (2) more 16-ounce containers of WATER over the next hour.  3. You may drink clear liquids up until (4) hours before your scheduled procedure time. After this you should have nothing by mouth. This includes GUM or CANDY.   a. Chewing tobacco must be stopped (6) hours before your scheduled procedure.   b. If you have an early morning test, take ONLY your essential morning medications (heart, blood pressure, seizure, etc.) with a small sip of water.  4. Bathe, brush teeth and gargle the morning of your procedure.   5. Wear comfortable, casual, loose fitting clothing that are easy to get on and off.  6. Take off any jewelry, take out any piercings, leave all valuables at home.  7. If you wear glasses or contacts, please bring a case for their safekeeping.   8. You will be sedated for the procedure. A responsible adult must drive you home (no Benedetto Goad, taxis or buses are allowed) and we ask that your ride stay at the facility until your procedure is finished. If you do not have a driver we will be unable to do the test.  9. Please plan to be here approximately 2 hours and encourage your driver to be within 20 minutes of the facility.   10. You will not be able to return to work the same day if you have received sedation. 11. Please bring a list of your current medications and dosages with you.  12. Please bring your ID and insurance card along with any required copay/deductible.  13. Please wear a mask and have your driver do the same (it does not have to be medical grade).    You are scheduled for a Covid-19 test  on 12/7 at 11:40AM  Please see the map below for instructions    Pre-Admissions Testing RN  [facility], [facility]  [email or web address]  [address]   [place], [place] [address]  [phone] [facility])  [email or web address]  An Affiliate  of [facility]

## 2019-03-16 ENCOUNTER — Encounter: Admit: 2019-03-16 | Discharge: 2019-03-16 | Payer: MEDICARE

## 2019-03-16 ENCOUNTER — Encounter: Admit: 2019-03-16 | Discharge: 2019-03-17 | Payer: MEDICARE

## 2019-03-16 DIAGNOSIS — Z20828 Contact with and (suspected) exposure to other viral communicable diseases: Secondary | ICD-10-CM

## 2019-03-16 DIAGNOSIS — Z1159 Encounter for screening for other viral diseases: Secondary | ICD-10-CM

## 2019-03-18 ENCOUNTER — Encounter: Admit: 2019-03-18 | Discharge: 2019-03-18 | Payer: MEDICARE

## 2019-03-18 DIAGNOSIS — I2699 Other pulmonary embolism without acute cor pulmonale: Secondary | ICD-10-CM

## 2019-03-18 DIAGNOSIS — K760 Fatty (change of) liver, not elsewhere classified: Secondary | ICD-10-CM

## 2019-03-18 DIAGNOSIS — M5416 Radiculopathy, lumbar region: Secondary | ICD-10-CM

## 2019-03-18 DIAGNOSIS — I2510 Atherosclerotic heart disease of native coronary artery without angina pectoris: Secondary | ICD-10-CM

## 2019-03-18 DIAGNOSIS — R768 Other specified abnormal immunological findings in serum: Secondary | ICD-10-CM

## 2019-03-18 DIAGNOSIS — N529 Male erectile dysfunction, unspecified: Secondary | ICD-10-CM

## 2019-03-18 DIAGNOSIS — I351 Nonrheumatic aortic (valve) insufficiency: Secondary | ICD-10-CM

## 2019-03-18 DIAGNOSIS — I10 Essential (primary) hypertension: Secondary | ICD-10-CM

## 2019-03-18 DIAGNOSIS — G609 Hereditary and idiopathic neuropathy, unspecified: Secondary | ICD-10-CM

## 2019-03-18 DIAGNOSIS — I493 Ventricular premature depolarization: Secondary | ICD-10-CM

## 2019-03-18 DIAGNOSIS — M1990 Unspecified osteoarthritis, unspecified site: Secondary | ICD-10-CM

## 2019-03-18 DIAGNOSIS — I712 Thoracic aortic aneurysm, without rupture: Secondary | ICD-10-CM

## 2019-03-18 DIAGNOSIS — E669 Obesity, unspecified: Secondary | ICD-10-CM

## 2019-03-18 DIAGNOSIS — I428 Other cardiomyopathies: Secondary | ICD-10-CM

## 2019-03-18 DIAGNOSIS — E785 Hyperlipidemia, unspecified: Secondary | ICD-10-CM

## 2019-03-18 MED ORDER — ONDANSETRON HCL (PF) 4 MG/2 ML IJ SOLN
4 mg | Freq: Once | INTRAVENOUS | 0 refills | Status: AC | PRN
Start: 2019-03-18 — End: ?

## 2019-03-18 MED ORDER — PROMETHAZINE 25 MG/ML IJ SOLN
6.25 mg | INTRAVENOUS | 0 refills | Status: DC | PRN
Start: 2019-03-18 — End: 2019-03-23

## 2019-03-18 MED ORDER — PROPOFOL 10 MG/ML IV EMUL 50 ML (INFUSION)(AM)(OR)
INTRAVENOUS | 0 refills | Status: DC
Start: 2019-03-18 — End: 2019-03-18

## 2019-03-18 MED ORDER — HALOPERIDOL LACTATE 5 MG/ML IJ SOLN
1 mg | Freq: Once | INTRAVENOUS | 0 refills | Status: AC | PRN
Start: 2019-03-18 — End: ?

## 2019-03-18 MED ORDER — LACTATED RINGERS IV SOLP
INTRAVENOUS | 0 refills | Status: DC
Start: 2019-03-18 — End: 2019-03-23

## 2019-03-18 MED ORDER — SIMETHICONE 40 MG/0.6 ML PO DRPS
0 refills | Status: DC
Start: 2019-03-18 — End: 2019-03-23

## 2019-03-18 MED ORDER — LIDOCAINE (PF) 10 MG/ML (1 %) IJ SOLN
.1-2 mL | INTRAMUSCULAR | 0 refills | Status: DC | PRN
Start: 2019-03-18 — End: 2019-03-23

## 2019-03-19 ENCOUNTER — Encounter: Admit: 2019-03-19 | Discharge: 2019-03-19 | Payer: MEDICARE

## 2019-03-19 DIAGNOSIS — E785 Hyperlipidemia, unspecified: Secondary | ICD-10-CM

## 2019-03-19 DIAGNOSIS — K760 Fatty (change of) liver, not elsewhere classified: Secondary | ICD-10-CM

## 2019-03-19 DIAGNOSIS — N529 Male erectile dysfunction, unspecified: Secondary | ICD-10-CM

## 2019-03-19 DIAGNOSIS — E669 Obesity, unspecified: Secondary | ICD-10-CM

## 2019-03-19 DIAGNOSIS — M1990 Unspecified osteoarthritis, unspecified site: Secondary | ICD-10-CM

## 2019-03-19 DIAGNOSIS — R768 Other specified abnormal immunological findings in serum: Secondary | ICD-10-CM

## 2019-03-19 DIAGNOSIS — I2510 Atherosclerotic heart disease of native coronary artery without angina pectoris: Secondary | ICD-10-CM

## 2019-03-19 DIAGNOSIS — I493 Ventricular premature depolarization: Secondary | ICD-10-CM

## 2019-03-19 DIAGNOSIS — M5416 Radiculopathy, lumbar region: Secondary | ICD-10-CM

## 2019-03-19 DIAGNOSIS — I351 Nonrheumatic aortic (valve) insufficiency: Secondary | ICD-10-CM

## 2019-03-19 DIAGNOSIS — G609 Hereditary and idiopathic neuropathy, unspecified: Secondary | ICD-10-CM

## 2019-03-19 DIAGNOSIS — I712 Thoracic aortic aneurysm, without rupture: Secondary | ICD-10-CM

## 2019-03-19 DIAGNOSIS — I10 Essential (primary) hypertension: Secondary | ICD-10-CM

## 2019-03-19 DIAGNOSIS — I428 Other cardiomyopathies: Secondary | ICD-10-CM

## 2019-03-19 DIAGNOSIS — I2699 Other pulmonary embolism without acute cor pulmonale: Secondary | ICD-10-CM

## 2019-03-23 ENCOUNTER — Encounter: Admit: 2019-03-23 | Discharge: 2019-03-23 | Payer: MEDICARE

## 2019-03-24 ENCOUNTER — Encounter: Admit: 2019-03-24 | Discharge: 2019-03-24 | Payer: MEDICARE

## 2019-03-24 ENCOUNTER — Ambulatory Visit: Admit: 2019-03-24 | Discharge: 2019-03-24 | Payer: MEDICARE

## 2019-03-24 DIAGNOSIS — N529 Male erectile dysfunction, unspecified: Secondary | ICD-10-CM

## 2019-03-24 DIAGNOSIS — M1990 Unspecified osteoarthritis, unspecified site: Secondary | ICD-10-CM

## 2019-03-24 DIAGNOSIS — I2699 Other pulmonary embolism without acute cor pulmonale: Secondary | ICD-10-CM

## 2019-03-24 DIAGNOSIS — I2510 Atherosclerotic heart disease of native coronary artery without angina pectoris: Secondary | ICD-10-CM

## 2019-03-24 DIAGNOSIS — E669 Obesity, unspecified: Secondary | ICD-10-CM

## 2019-03-24 DIAGNOSIS — I10 Essential (primary) hypertension: Secondary | ICD-10-CM

## 2019-03-24 DIAGNOSIS — E785 Hyperlipidemia, unspecified: Secondary | ICD-10-CM

## 2019-03-24 DIAGNOSIS — I428 Other cardiomyopathies: Secondary | ICD-10-CM

## 2019-03-24 DIAGNOSIS — I2782 Chronic pulmonary embolism: Secondary | ICD-10-CM

## 2019-03-24 DIAGNOSIS — K760 Fatty (change of) liver, not elsewhere classified: Secondary | ICD-10-CM

## 2019-03-24 DIAGNOSIS — R768 Other specified abnormal immunological findings in serum: Secondary | ICD-10-CM

## 2019-03-24 DIAGNOSIS — I493 Ventricular premature depolarization: Secondary | ICD-10-CM

## 2019-03-24 DIAGNOSIS — I351 Nonrheumatic aortic (valve) insufficiency: Secondary | ICD-10-CM

## 2019-03-24 DIAGNOSIS — G609 Hereditary and idiopathic neuropathy, unspecified: Secondary | ICD-10-CM

## 2019-03-24 DIAGNOSIS — Z01818 Encounter for other preprocedural examination: Secondary | ICD-10-CM

## 2019-03-24 DIAGNOSIS — M5416 Radiculopathy, lumbar region: Secondary | ICD-10-CM

## 2019-03-24 DIAGNOSIS — I712 Thoracic aortic aneurysm, without rupture: Secondary | ICD-10-CM

## 2019-03-24 NOTE — Progress Notes
Date of Service: 03/24/2019    Victor Graham is a 68 y.o. male.  DOB: [birth year]  MRN: [id number]     Subjective:             Patient Reported Other  What medical problem brings you in to see the provider? I'm having carpal tunnel surgery on my left arm on January 7th, 2021 at [facility] at the [facility]. They require clearance from my primary doctor to do the surgery. The diagnosis of carpal tunnel syndrome was confirmed by EMG at the [facility].. This is a chronic problem. The current episode started more than 1 month ago. The problem occurs daily. The problem has been waxing and waning. The pain score is: 1/10. The pain severity is mild.  Associated symptoms include myalgias, numbness and weakness. Pertinent negatives include no abdominal pain, anorexia, arthralgias, change in bowel habit, chest pain, chills, congestion, coughing, diaphoresis, fatigue, fever, headaches, joint swelling, nausea, neck pain, rash, sore throat, swollen glands, urinary symptoms, vertigo, visual change or vomiting.       Patient is here to discuss an upcoming surgery scheduled for a left carpal and cubital tunnel release.  He reports left hand, particularly thumb through 4th finger, numbness and tingling and pins and needles.  He has tried multiple treatment options including meds and bracing.    He does have quite an extensive cardiac history but it does appear to be stable.  He recently did see his Cardiologist.  He has a history of HTN, HLD, CAD, chronic PE with long term anticoagulation use.  Additionally he has a history of prediabetes but has managed well with metformin.    Hypertension Management:  Medication adherent: most of the time    Treatment goal: 140/90  Outside blood pressures being performed: Yes  BP Readings from Last 3 Encounters:   03/24/19 102/64   03/18/19 114/86   02/24/19 98/68     He denies significant light-headedness.    Hyperlipidemia Management  LDL goal < 100. Diet adherence:most of the time   Medication adherence:all of the time   Side effects to medications? No  Lab Results   Component Value Date/Time    CHOL 179 09/12/2018 01:37 PM    TRIG 201 (H) 09/12/2018 01:37 PM    HDL 42 09/12/2018 01:37 PM    LDL 100 (H) 09/12/2018 01:37 PM    VLDL 40 09/12/2018 01:37 PM    NONHDLCHOL 137 09/12/2018 01:37 PM    ALT 27 01/06/2019 03:47 PM    CK 255 (H) 01/06/2019 03:47 PM    CR 0.8 02/19/2019 11:39 AM    CR 0.87 01/06/2019 03:47 PM    K 4.1 01/06/2019 03:47 PM         Medical History:   Diagnosis Date   ? Arthritis    ? Ascending aortic aneurysm (HCC) 01/08/2008   ? Asymptomatic PVCs 03/11/2015    03/2015 per pt, has had them for a long time & does not feel them usually    ? CAD (coronary artery disease)    ? Dyslipidemia    ? Erectile dysfunction    ? Fatty liver    ? HLD (hyperlipidemia) 01/08/2008   ? Hypertension 01/08/2008   ? Idiopathic polyneuropathy    ? Lumbar radiculopathy    ? NICM (nonischemic cardiomyopathy) (HCC)    ? 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 Obesity    ?  Positive ANA (antinuclear antibody) 02/03/2016   ? Positive ANA (antinuclear antibody) 02/03/2016   ? Pulmonary embolism [facility]) 2015 -- july 20th    takes xaralto anticoagulant -- treated at [facility]     Surgical History:   Procedure Laterality Date   ? FASCIOTOMY Right 1992   ? KNEE SURGERY Right 02/2007    Torn meniscus removed   ? HX LUMBAR FUSION  02/2008    L4-5   ? AAA REPAIR  06/2008   ? HX JOINT REPLACEMENT Right 06/2010   ? HERNIA REPAIR  01/2013    x2   ? KNEE REPLACEMENT Left 03/2013   ? CYST REMOVAL Left 06/2013    wrist   ? STIMULATOR IMPLANT  12/2014    Spinal stimulator [employer]   ? CYST REMOVAL Right 08/2017    wrist   ? SHOULDER SURGERY  08/2018    reversed arthroplasty   ? COLONOSCOPY DIAGNOSTIC WITH SPECIMEN COLLECTION BY BRUSHING/ WASHING - FLEXIBLE N/A 03/18/2019    Performed by Lenor Derrick, MD at [facility] OR ? COLONOSCOPY WITH CONTROL OF BLEEDING N/A 03/18/2019    Performed by Lenor Derrick, MD at [facility] OR   ? COLONOSCOPY WITH SUBMUCOSAL INJECTION N/A 03/18/2019    Performed by Lenor Derrick, MD at [facility] OR   ? PR LAPAROSCOPY SURG RPR INITIAL INGUINAL HERNIA       Family History   Problem Relation Age of Onset   ? DVT Mother    ? Cancer Father    ? DVT Brother    ? Stroke Paternal Grandfather    ? Cancer Paternal Grandmother      Social History     Socioeconomic History   ? Marital status: Married     Spouse name: Not on file   ? Number of children: Not on file   ? Years of education: Not on file   ? Highest education level: Not on file   Occupational History   ? Not on file   Tobacco Use   ? Smoking status: Former Smoker     Years: 20.00     Quit date: 08/24/1991     Years since quitting: 27.6   ? Smokeless tobacco: Never Used   Substance and Sexual Activity   ? Alcohol use: No     Alcohol/week: 0.0 standard drinks   ? Drug use: No   ? Sexual activity: Not Currently     Partners: Female   Other Topics Concern   ? Financial planner Yes     CommentPersonnel officer, [employer]   ? Blood Transfusions Not Asked   ? Caffeine Concern Not Asked   ? Occupational Exposure Not Asked   ? Hobby Hazards Not Asked   ? Sleep Concern Not Asked   ? Stress Concern Not Asked   ? Weight Concern Not Asked   ? Special Diet Not Asked   ? Back Care Not Asked   ? Exercise Not Asked   ? Bike Helmet Not Asked   ? Seat Belt Not Asked   ? Self-Exams Not Asked   Social History Narrative   ? Not on file              Review of Systems   Constitutional: Negative for chills, diaphoresis, fatigue and fever.   HENT: Negative for congestion and sore throat.    Respiratory: Negative for cough.    Cardiovascular: Negative for chest pain.   Gastrointestinal: Negative for abdominal pain,  anorexia, change in bowel habit, nausea and vomiting.   Musculoskeletal: Positive for myalgias. Negative for arthralgias, joint swelling and neck pain.   Skin: Negative for rash. Neurological: Positive for weakness and numbness. Negative for vertigo and headaches.         Objective:         ? carvediloL (COREG) 6.25 mg tablet Take one tablet by mouth twice daily.   ? cholecalciferol (VITAMIN D) 1,000 units tablet Take 1 capsule by mouth daily.   ? cyanocobalamin (VITAMIN B-12) 500 mcg tablet Take 500 mcg by mouth daily.   ? furosemide (LASIX) 40 mg tablet Take one tablet by mouth every morning.   ? GLUCOSAMINE HCL PO Take 1,000 mg by mouth twice daily.   ? lisinopriL (ZESTRIL) 10 mg tablet Take one tablet by mouth daily.   ? metFORMIN (GLUCOPHAGE) 500 mg tablet Take one tablet by mouth daily.   ? rivaroxaban (XARELTO) 20 mg tablet Take one tablet by mouth daily with dinner.   ? rosuvastatin (CRESTOR) 20 mg tablet Take one tablet by mouth daily.   ? SAW PALMETTO PO Take  by mouth.   ? Sodium,Potassium,&Mag Sulfates (SUPREP BOWEL PREP KIT) 17.5-3.13-1.6 gram solr Take 354 mL by mouth as Needed.   ? spironolactone (ALDACTONE) 25 mg tablet Take one tablet by mouth daily. Please call to schedule office visit with Dr Iver Nestle   ? tiZANidine (ZANAFLEX) 2 mg tablet TAKE 1 TO 2 TABLETS THREE TIMES A DAY AS NEEDED   ? traMADol (ULTRAM) 50 mg tablet Take 50 mg by mouth every 8 hours as needed.     Vitals:    03/24/19 0955   BP: 102/64   BP Source: Arm, Right Upper   Patient Position: Sitting   Pulse: 79   Resp: 16   Temp: 37.3 ?C (99.1 ?F)   TempSrc: Temporal   SpO2: 96%   Weight: 130.8 kg (288 lb 6.4 oz)   Height: 177.8 cm (70)     Body mass index is 41.38 kg/m?Marland Kitchen     Physical Exam  Vitals signs reviewed.   Constitutional:       Appearance: Normal appearance.   HENT:      Head: Normocephalic and atraumatic.      Right Ear: External ear normal.      Left Ear: External ear normal.      Nose: Nose normal.      Mouth/Throat:      Mouth: Mucous membranes are moist.   Eyes:      Extraocular Movements: Extraocular movements intact.      Conjunctiva/sclera: Conjunctivae normal.   Neck: Musculoskeletal: Neck supple. No muscular tenderness.   Cardiovascular:      Rate and Rhythm: Normal rate and regular rhythm.      Heart sounds: Normal heart sounds. No murmur.   Pulmonary:      Effort: Pulmonary effort is normal.      Breath sounds: Normal breath sounds. No wheezing.   Lymphadenopathy:      Cervical: No cervical adenopathy.   Skin:     General: Skin is warm.      Capillary Refill: Capillary refill takes less than 2 seconds.      Findings: No rash.   Neurological:      General: No focal deficit present.      Mental Status: He is alert. Mental status is at baseline.      Coordination: Coordination normal.   Psychiatric:  Mood and Affect: Mood normal.         Behavior: Behavior normal.         Judgment: Judgment normal.              Assessment and Plan:  1. Visit for pre-operative examination  - I have reviewed the patient's medical history in detail and updated the computerized patient record.  - he has recently been seen by his Cardiologist  - CBC AND DIFF; Future  - COMPREHENSIVE METABOLIC PANEL; Future    2. Essential hypertension  Hypertension    Plan:   Discussed hypertension and reviewed goals.  Are barriers to achieving goals present? No  Medication education provided. Patient voiced understanding? Yes  Patient able to self-manage and ready to comply? Yes  Educational resources identified? Verbal Counseling      3. Hyperlipidemia, unspecified hyperlipidemia type  Hyperlipidemia    Plan:  Discussed labs and reviewed goals for LDL, HDL, triglycerides.   Discussed exercise management and diet with emphasis on vegetables, fruit and lean meat.  Are barriers to achieving goals present? No  Medication education provided. Patient voiced understanding? Yes  Patient able to self-manage and ready to comply?Yes  Educational resources identified? Verbal Counseling       4. Cardiomyopathy, idiopathic (HCC) - I have reviewed the patient's medical history in detail and updated the computerized patient record.      5. Other chronic pulmonary embolism without acute cor pulmonale (HCC)  - I have reviewed the patient's medical history in detail and updated the computerized patient record.

## 2019-03-25 ENCOUNTER — Encounter: Admit: 2019-03-25 | Discharge: 2019-03-25 | Payer: MEDICARE

## 2019-03-26 ENCOUNTER — Encounter: Admit: 2019-03-26 | Discharge: 2019-03-26 | Payer: MEDICARE

## 2019-03-26 ENCOUNTER — Ambulatory Visit: Admit: 2019-03-26 | Discharge: 2019-03-26 | Payer: MEDICARE

## 2019-03-26 DIAGNOSIS — M1990 Unspecified osteoarthritis, unspecified site: Secondary | ICD-10-CM

## 2019-03-26 DIAGNOSIS — I493 Ventricular premature depolarization: Secondary | ICD-10-CM

## 2019-03-26 DIAGNOSIS — M5416 Radiculopathy, lumbar region: Secondary | ICD-10-CM

## 2019-03-26 DIAGNOSIS — E669 Obesity, unspecified: Secondary | ICD-10-CM

## 2019-03-26 DIAGNOSIS — R490 Dysphonia: Secondary | ICD-10-CM

## 2019-03-26 DIAGNOSIS — E785 Hyperlipidemia, unspecified: Secondary | ICD-10-CM

## 2019-03-26 DIAGNOSIS — I428 Other cardiomyopathies: Secondary | ICD-10-CM

## 2019-03-26 DIAGNOSIS — Z01818 Encounter for other preprocedural examination: Secondary | ICD-10-CM

## 2019-03-26 DIAGNOSIS — N529 Male erectile dysfunction, unspecified: Secondary | ICD-10-CM

## 2019-03-26 DIAGNOSIS — I2510 Atherosclerotic heart disease of native coronary artery without angina pectoris: Secondary | ICD-10-CM

## 2019-03-26 DIAGNOSIS — J383 Other diseases of vocal cords: Secondary | ICD-10-CM

## 2019-03-26 DIAGNOSIS — I2699 Other pulmonary embolism without acute cor pulmonale: Secondary | ICD-10-CM

## 2019-03-26 DIAGNOSIS — I10 Essential (primary) hypertension: Secondary | ICD-10-CM

## 2019-03-26 DIAGNOSIS — K760 Fatty (change of) liver, not elsewhere classified: Secondary | ICD-10-CM

## 2019-03-26 DIAGNOSIS — I712 Thoracic aortic aneurysm, without rupture: Secondary | ICD-10-CM

## 2019-03-26 DIAGNOSIS — I351 Nonrheumatic aortic (valve) insufficiency: Secondary | ICD-10-CM

## 2019-03-26 DIAGNOSIS — G609 Hereditary and idiopathic neuropathy, unspecified: Secondary | ICD-10-CM

## 2019-03-26 DIAGNOSIS — R768 Other specified abnormal immunological findings in serum: Secondary | ICD-10-CM

## 2019-03-26 LAB — COMPREHENSIVE METABOLIC PANEL
Lab: 0.9 mg/dL (ref 0.4–1.24)
Lab: 126 mg/dL — ABNORMAL HIGH (ref 70–100)
Lab: 139 MMOL/L (ref 137–147)
Lab: 32 U/L — ABNORMAL LOW (ref 25–110)
Lab: 34 U/L (ref 7–56)
Lab: 4.5 MMOL/L (ref 3.5–5.1)

## 2019-03-26 LAB — CBC AND DIFF
Lab: 0 10*3/uL (ref 0–0.20)
Lab: 0.1 10*3/uL (ref 0–0.45)
Lab: 0.5 10*3/uL (ref 0–0.80)
Lab: 1 % (ref >60)
Lab: 1.8 10*3/uL (ref 1.0–4.8)
Lab: 14 % (ref 11–15)
Lab: 14 g/dL (ref 13.5–16.5)
Lab: 2 % (ref >60)
Lab: 29 pg (ref 26–34)
Lab: 3.2 10*3/uL (ref 1.8–7.0)
Lab: 33 g/dL (ref 32.0–36.0)
Lab: 5.7 10*3/uL (ref 4.5–11.0)
Lab: 57 % (ref 41–77)
Lab: 88 FL (ref 80–100)
Lab: 9 % (ref 4–12)
Lab: 9.6 FL (ref 7–11)

## 2019-03-26 NOTE — Progress Notes
Date of Service: 03/26/2019    Subjective:             Victor Graham is a 68 y.o. male.    History of Present Illness    Victor Graham is a very pleasant 68 y.o. gentleman who is referred by Dr. Alonza Smoker for a complaint of dysphonia.      Victor Graham presented in a one year history of dysphonia in September 2020.  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  Given the options of surgery, therapy or observation, he elected observation.    Victor Graham returns today in follow up.  He has followed up with his Cardiology team regarding his AAA.  He is happy to report that his repair is stable.  He continues to follow with Pulmonology.  He has repeat PFTs in 2 months.  In January 2021, he will have a left carpel tunnel repair.  Overall, his voice is stable.  It is abnormal, but he is able to manage with his current vocal function.      Family History   Problem Relation Age of Onset   ? DVT Mother    ? Cancer Father    ? DVT Brother    ? Stroke Paternal Grandfather    ? Cancer Paternal Grandmother        Social History     Socioeconomic History   ? Marital status: Married     Spouse name: Not on file   ? Number of children: Not on file   ? Years of education: Not on file   ? Highest education level: Not on file   Occupational History   ? Not on file   Tobacco Use   ? Smoking status: Former Smoker     Years: 20.00     Quit date: 08/24/1991     Years since quitting: 27.6   ? Smokeless tobacco: Never Used   Substance and Sexual Activity   ? Alcohol use: No     Alcohol/week: 0.0 standard drinks   ? Drug use: No   ? Sexual activity: Not Currently     Partners: Female   Other Topics Concern   ? Financial planner Yes     CommentPersonnel officer, [employer]   ? Blood Transfusions Not Asked   ? Caffeine Concern Not Asked   ? Occupational Exposure Not Asked   ? Hobby Hazards Not Asked   ? Sleep Concern Not Asked   ? Stress Concern Not Asked   ? Weight Concern Not Asked ? Special Diet Not Asked   ? Back Care Not Asked   ? Exercise Not Asked   ? Bike Helmet Not Asked   ? Seat Belt Not Asked   ? Self-Exams Not Asked   Social History Narrative   ? Not on file          Review of Systems   Constitutional: Negative.    HENT: Positive for voice change.    Eyes: Negative.    Respiratory: Negative.    Cardiovascular: Negative.    Gastrointestinal: Negative.    Endocrine: Negative.    Genitourinary: Negative.    Musculoskeletal: Positive for arthralgias.   Skin: Negative.    Allergic/Immunologic: Negative.    Neurological: Negative.    Hematological: Negative.    Psychiatric/Behavioral: Negative.          Objective:         ?  carvediloL (COREG) 6.25 mg tablet Take one tablet by mouth twice daily.   ? cholecalciferol (VITAMIN D) 1,000 units tablet Take 1 capsule by mouth daily.   ? cyanocobalamin (VITAMIN B-12) 500 mcg tablet Take 500 mcg by mouth daily.   ? furosemide (LASIX) 40 mg tablet Take one tablet by mouth every morning.   ? GLUCOSAMINE HCL PO Take 1,000 mg by mouth twice daily.   ? lisinopriL (ZESTRIL) 10 mg tablet Take one tablet by mouth daily.   ? metFORMIN (GLUCOPHAGE) 500 mg tablet Take one tablet by mouth daily.   ? rivaroxaban (XARELTO) 20 mg tablet Take one tablet by mouth daily with dinner.   ? rosuvastatin (CRESTOR) 20 mg tablet Take one tablet by mouth daily.   ? SAW PALMETTO PO Take  by mouth.   ? Sodium,Potassium,&Mag Sulfates (SUPREP BOWEL PREP KIT) 17.5-3.13-1.6 gram solr Take 354 mL by mouth as Needed.   ? spironolactone (ALDACTONE) 25 mg tablet Take one tablet by mouth daily. Please call to schedule office visit with Dr Iver Nestle   ? tiZANidine (ZANAFLEX) 2 mg tablet TAKE 1 TO 2 TABLETS THREE TIMES A DAY AS NEEDED   ? traMADol (ULTRAM) 50 mg tablet Take 50 mg by mouth every 8 hours as needed.     Vitals:    03/26/19 1127   BP: 107/71   BP Source: Arm, Left Upper   Patient Position: Sitting   Temp: 37.3 ?C (99.1 ?F)   Weight: 130.6 kg (288 lb)   Height: 177.8 cm (70) PainSc: Five     Body mass index is 41.32 kg/m?Marland Kitchen     Physical Exam    EXAM:  Constitutional: Blood pressure 107/71, temperature 37.3 ?C (99.1 ?F), height 177.8 cm (70), weight 130.6 kg (288 lb).    alert, cooperative and morbidly obese  Neurologic: Alert and oriented x 3.    Cranial nerves II - XII grossly intact and symmetric.   Eyes:  Pupils equal round and reactive.       Extraocular eye movements intact and full.  Ears:  Pinna appear normal.  Hearing intact to finger rustle.    EACs appear normal    Tympanic membranes intact without effusion, retraction or perforation.  Nose:  Dorsum straight.  Nares patent.    On anterior rhinoscopy,  the mucosa is moist.  Anterior turbinates 2+.    Septum midline.  No mucopurulent discharge or polyps.  Oral Cavity: Dentition is in fair repair.  Oral mucosa pink and moist.    No masses or lesions.      Tongue mobile.  Floor of mouth without mass or lesion.      Oropharynx: No mucosal masses or lesions.  Tonsils surgically absent.    Soft palate elevates symmetrically.  Neck:  Soft, non-tender.  No appreciable lymphadenopathy.    Trachea midline.  Laryngeal landmarks palpable with normal crepitus.    Larynx elevates with swallowing.  Thyrohyoid muscles nontender.    Thyroid without mass or tenderness.  Mobile with swallow.  Respiratory: Breathing comfortably.  No suprasternal/suprclavicular retractions    or accessory muscle use.  No stridor.    RSI = 22  VHI = 40    VOICE EVALUATION:  Voice is graded G2 R1 B1 A0 S1.  Pitch is appropriate for gender.  Volume is diminished.  Glottal fry is appreciated. Tremor is not noted.  Voice breaks are not appreciated.  Articulation is normal.  Resonance is normal. LARYNGOSCOPY:  After applying topical decongestant and anesthetic,  flexible laryngoscopy was performed using a fiberoptic scope through the right nare.  The nasal passage was clear and without polyposis or mucopurulent secretions.  The nasopharyngeal mucosa appeared normal.  Adenoid tissue was absent.  The fossa of Rosenmuller was clear and the eustachian tube openings were without mass or lesion. The tongue base was symmetric and without mucosal mass or lesion.  The vallecula was clear.  The epiglottis was upright.  The aryepiglottic folds and false folds appeared normal and were without mucosal mass or lesion.  The visualized hypopharynx was unremarkable.  The true vocal folds were notable for a small cyst on the right anterior cord. There was atrophy bilaterally.  The right and left cords adducted and abducted fully.  Though difficult to assess without stroboscopy, closure appeared to be complete.  A limited view of the subglottis revealed no mucosal lesions or stenosis.       Assessment and Plan:    ASSESSMENT:  Encounter Diagnoses   Name Primary?   ? Vocal fold cyst Yes   ? Vocal fold atrophy    ? Dysphonia        PLAN:    I have reviewed my findings with Victor Graham.  The small subepithelial cyst on the right does appear to be stable on exam today.  Options include surgery, therapy or observation.  While his other medical conditions seem to be stable, he is satisfied to continue with observation for now.  He understand that he may call me anytime if he notices a significant change in his voice.  Otherwise, I plan to see him back in approximately 9 months for continued surveillance.  All questions answered.

## 2019-05-02 ENCOUNTER — Ambulatory Visit: Admit: 2019-05-02 | Discharge: 2019-05-03 | Payer: MEDICARE

## 2019-05-02 MED ORDER — COVID-19 VACC,MRNA(PFIZER)(PF) 30 MCG/0.3 ML IM SUSR
30 ug | Freq: Once | INTRAMUSCULAR | 0 refills | Status: CP
Start: 2019-05-02 — End: ?

## 2019-05-03 ENCOUNTER — Encounter: Admit: 2019-05-03 | Discharge: 2019-05-03 | Payer: MEDICARE

## 2019-05-04 ENCOUNTER — Encounter: Admit: 2019-05-04 | Discharge: 2019-05-04 | Payer: MEDICARE

## 2019-05-04 DIAGNOSIS — R0789 Other chest pain: Secondary | ICD-10-CM

## 2019-05-04 MED ORDER — SPIRONOLACTONE 25 MG PO TAB
25 mg | ORAL_TABLET | Freq: Every day | ORAL | 3 refills | 46.00000 days | Status: AC
Start: 2019-05-04 — End: ?

## 2019-05-19 ENCOUNTER — Encounter: Admit: 2019-05-19 | Discharge: 2019-05-19 | Payer: MEDICARE

## 2019-05-26 ENCOUNTER — Encounter: Admit: 2019-05-26 | Discharge: 2019-05-26 | Payer: MEDICARE

## 2019-05-27 ENCOUNTER — Encounter: Admit: 2019-05-27 | Discharge: 2019-05-27 | Payer: MEDICARE

## 2019-05-27 ENCOUNTER — Ambulatory Visit: Admit: 2019-05-27 | Discharge: 2019-05-28 | Payer: MEDICARE

## 2019-05-27 MED ORDER — COVID-19 VACC,MRNA(PFIZER)(PF) 30 MCG/0.3 ML IM SUSR
30 ug | Freq: Once | INTRAMUSCULAR | 0 refills | Status: CP
Start: 2019-05-27 — End: ?

## 2019-06-01 ENCOUNTER — Encounter: Admit: 2019-06-01 | Discharge: 2019-06-01 | Payer: MEDICARE

## 2019-06-02 ENCOUNTER — Encounter: Admit: 2019-06-02 | Discharge: 2019-06-02 | Payer: MEDICARE

## 2019-06-04 ENCOUNTER — Encounter: Admit: 2019-06-04 | Discharge: 2019-06-04 | Payer: MEDICARE

## 2019-06-04 ENCOUNTER — Ambulatory Visit: Admit: 2019-06-04 | Discharge: 2019-06-04 | Payer: MEDICARE

## 2019-06-04 DIAGNOSIS — E785 Hyperlipidemia, unspecified: Secondary | ICD-10-CM

## 2019-06-04 DIAGNOSIS — I712 Thoracic aortic aneurysm, without rupture: Secondary | ICD-10-CM

## 2019-06-04 LAB — LIPID PROFILE
Lab: 115 mg/dL (ref 0–5)
Lab: 150 mg/dL (ref <200)
Lab: 221 mg/dL — ABNORMAL HIGH (ref <150)
Lab: 35 mg/dL — ABNORMAL LOW (ref >40)
Lab: 44 mg/dL (ref 4–12)
Lab: 81 mg/dL (ref <100)

## 2019-06-09 ENCOUNTER — Encounter: Admit: 2019-06-09 | Discharge: 2019-06-09 | Payer: MEDICARE

## 2019-06-09 NOTE — Progress Notes
Reviewed lipid profile with Dr. Curly Shores and he doesn't want to make any changes at this time. Pt can see results in mychart.

## 2019-06-28 ENCOUNTER — Encounter: Admit: 2019-06-28 | Discharge: 2019-06-28 | Payer: MEDICARE

## 2019-06-29 ENCOUNTER — Encounter: Admit: 2019-06-29 | Discharge: 2019-06-29 | Payer: MEDICARE

## 2019-06-29 DIAGNOSIS — E669 Obesity, unspecified: Secondary | ICD-10-CM

## 2019-06-29 MED ORDER — METFORMIN 500 MG PO TAB
500 mg | ORAL_TABLET | Freq: Every day | ORAL | 0 refills | Status: DC
Start: 2019-06-29 — End: 2019-09-28

## 2019-06-29 MED ORDER — PREGABALIN 200 MG PO CAP
200 mg | ORAL_CAPSULE | Freq: Two times a day (BID) | ORAL | 1 refills | Status: AC
Start: 2019-06-29 — End: ?

## 2019-07-06 ENCOUNTER — Encounter: Admit: 2019-07-06 | Discharge: 2019-07-06 | Payer: MEDICARE

## 2019-07-07 ENCOUNTER — Encounter: Admit: 2019-07-07 | Discharge: 2019-07-07 | Payer: MEDICARE

## 2019-07-07 ENCOUNTER — Ambulatory Visit: Admit: 2019-07-07 | Discharge: 2019-07-07 | Payer: MEDICARE

## 2019-07-07 DIAGNOSIS — K760 Fatty (change of) liver, not elsewhere classified: Secondary | ICD-10-CM

## 2019-07-07 DIAGNOSIS — M5416 Radiculopathy, lumbar region: Secondary | ICD-10-CM

## 2019-07-07 DIAGNOSIS — J849 Interstitial pulmonary disease, unspecified: Secondary | ICD-10-CM

## 2019-07-07 DIAGNOSIS — I428 Other cardiomyopathies: Secondary | ICD-10-CM

## 2019-07-07 DIAGNOSIS — E669 Obesity, unspecified: Secondary | ICD-10-CM

## 2019-07-07 DIAGNOSIS — E785 Hyperlipidemia, unspecified: Secondary | ICD-10-CM

## 2019-07-07 DIAGNOSIS — I2510 Atherosclerotic heart disease of native coronary artery without angina pectoris: Secondary | ICD-10-CM

## 2019-07-07 DIAGNOSIS — M1990 Unspecified osteoarthritis, unspecified site: Secondary | ICD-10-CM

## 2019-07-07 DIAGNOSIS — G609 Hereditary and idiopathic neuropathy, unspecified: Secondary | ICD-10-CM

## 2019-07-07 DIAGNOSIS — I351 Nonrheumatic aortic (valve) insufficiency: Secondary | ICD-10-CM

## 2019-07-07 DIAGNOSIS — I2699 Other pulmonary embolism without acute cor pulmonale: Secondary | ICD-10-CM

## 2019-07-07 DIAGNOSIS — I493 Ventricular premature depolarization: Secondary | ICD-10-CM

## 2019-07-07 DIAGNOSIS — N529 Male erectile dysfunction, unspecified: Secondary | ICD-10-CM

## 2019-07-07 DIAGNOSIS — I10 Essential (primary) hypertension: Secondary | ICD-10-CM

## 2019-07-07 DIAGNOSIS — I712 Thoracic aortic aneurysm, without rupture: Secondary | ICD-10-CM

## 2019-07-07 DIAGNOSIS — R768 Other specified abnormal immunological findings in serum: Secondary | ICD-10-CM

## 2019-07-07 NOTE — Patient Instructions
Clinic Visit Summary:     Next clinic visit follow up with Dr Hall recommended in 6 months with Pulmonary Function Tests.     Please contact Pulmonary Nurse Coordinator with signs and symptoms of worsening productive cough with thick secretions, blood in sputum, chest tightness/pain, shortness of breath, fever, chills, night sweats, or any questions or concerns.     ILD/Sarcoidosis Clinical Care Coordinators-Jamie Ludwig, RN / Lindy Pennisi, RN / Anne Goodman, RN [phone]    For refills on medications, please have your pharmacy fax a refill authorization request form to our office at Fax) [phone]. Please allow at least 3 business days for refill requests.   For urgent issues after business hours/weekends/holidays call [phone] and request for the pulmonary fellow to be paged. For scheduling questions/concerns, please call [phone].

## 2019-07-09 ENCOUNTER — Encounter: Admit: 2019-07-09 | Discharge: 2019-07-09 | Payer: MEDICARE

## 2019-07-09 DIAGNOSIS — I428 Other cardiomyopathies: Secondary | ICD-10-CM

## 2019-07-09 DIAGNOSIS — I2699 Other pulmonary embolism without acute cor pulmonale: Secondary | ICD-10-CM

## 2019-07-09 DIAGNOSIS — I351 Nonrheumatic aortic (valve) insufficiency: Secondary | ICD-10-CM

## 2019-07-09 DIAGNOSIS — R768 Other specified abnormal immunological findings in serum: Secondary | ICD-10-CM

## 2019-07-09 DIAGNOSIS — I712 Thoracic aortic aneurysm, without rupture: Secondary | ICD-10-CM

## 2019-07-09 DIAGNOSIS — G609 Hereditary and idiopathic neuropathy, unspecified: Secondary | ICD-10-CM

## 2019-07-09 DIAGNOSIS — N529 Male erectile dysfunction, unspecified: Secondary | ICD-10-CM

## 2019-07-09 DIAGNOSIS — I493 Ventricular premature depolarization: Secondary | ICD-10-CM

## 2019-07-09 DIAGNOSIS — M5416 Radiculopathy, lumbar region: Secondary | ICD-10-CM

## 2019-07-09 DIAGNOSIS — M1990 Unspecified osteoarthritis, unspecified site: Secondary | ICD-10-CM

## 2019-07-09 DIAGNOSIS — I2510 Atherosclerotic heart disease of native coronary artery without angina pectoris: Secondary | ICD-10-CM

## 2019-07-09 DIAGNOSIS — E785 Hyperlipidemia, unspecified: Secondary | ICD-10-CM

## 2019-07-09 DIAGNOSIS — E669 Obesity, unspecified: Secondary | ICD-10-CM

## 2019-07-09 DIAGNOSIS — K760 Fatty (change of) liver, not elsewhere classified: Secondary | ICD-10-CM

## 2019-07-09 DIAGNOSIS — I10 Essential (primary) hypertension: Secondary | ICD-10-CM

## 2019-08-03 MED ORDER — CARVEDILOL 6.25 MG PO TAB
6.25 mg | ORAL_TABLET | Freq: Two times a day (BID) | ORAL | 1 refills | 90.00000 days | Status: AC
Start: 2019-08-03 — End: ?

## 2019-08-03 MED ORDER — LISINOPRIL 10 MG PO TAB
10 mg | ORAL_TABLET | Freq: Every day | ORAL | 1 refills | Status: AC
Start: 2019-08-03 — End: ?

## 2019-08-12 ENCOUNTER — Encounter: Admit: 2019-08-12 | Discharge: 2019-08-12 | Payer: MEDICARE

## 2019-08-24 ENCOUNTER — Encounter: Admit: 2019-08-24 | Discharge: 2019-08-24 | Payer: MEDICARE

## 2019-08-24 NOTE — Progress Notes
Contacted patient to verify if he has had any recent cardiac related hospital stays or ER visit.  The patient stated he has not been to the hospital since his last visit with Dr. Iver Nestle.

## 2019-08-25 ENCOUNTER — Encounter: Admit: 2019-08-25 | Discharge: 2019-08-25 | Payer: MEDICARE

## 2019-09-01 ENCOUNTER — Ambulatory Visit: Admit: 2019-09-01 | Discharge: 2019-09-01 | Payer: MEDICARE

## 2019-09-01 ENCOUNTER — Encounter: Admit: 2019-09-01 | Discharge: 2019-09-01 | Payer: MEDICARE

## 2019-09-01 DIAGNOSIS — G609 Hereditary and idiopathic neuropathy, unspecified: Secondary | ICD-10-CM

## 2019-09-01 DIAGNOSIS — M5416 Radiculopathy, lumbar region: Secondary | ICD-10-CM

## 2019-09-01 DIAGNOSIS — I10 Essential (primary) hypertension: Secondary | ICD-10-CM

## 2019-09-01 DIAGNOSIS — I428 Other cardiomyopathies: Secondary | ICD-10-CM

## 2019-09-01 DIAGNOSIS — K760 Fatty (change of) liver, not elsewhere classified: Secondary | ICD-10-CM

## 2019-09-01 DIAGNOSIS — I2510 Atherosclerotic heart disease of native coronary artery without angina pectoris: Secondary | ICD-10-CM

## 2019-09-01 DIAGNOSIS — I2699 Other pulmonary embolism without acute cor pulmonale: Secondary | ICD-10-CM

## 2019-09-01 DIAGNOSIS — M1990 Unspecified osteoarthritis, unspecified site: Secondary | ICD-10-CM

## 2019-09-01 DIAGNOSIS — I493 Ventricular premature depolarization: Secondary | ICD-10-CM

## 2019-09-01 DIAGNOSIS — N529 Male erectile dysfunction, unspecified: Secondary | ICD-10-CM

## 2019-09-01 DIAGNOSIS — E785 Hyperlipidemia, unspecified: Secondary | ICD-10-CM

## 2019-09-01 DIAGNOSIS — I351 Nonrheumatic aortic (valve) insufficiency: Secondary | ICD-10-CM

## 2019-09-01 DIAGNOSIS — R768 Other specified abnormal immunological findings in serum: Secondary | ICD-10-CM

## 2019-09-01 DIAGNOSIS — I712 Thoracic aortic aneurysm, without rupture: Secondary | ICD-10-CM

## 2019-09-01 DIAGNOSIS — E669 Obesity, unspecified: Secondary | ICD-10-CM

## 2019-09-01 NOTE — Telephone Encounter
It would appear that the metformin is controlling his blood sugars as his most recent HgbA1c was less than 7.  I think given his control with metformin that it would be unlikely that insurance would cover ozempic and I'm not sure it would improve things much.

## 2019-09-12 IMAGING — CR UP_EXM
3 series · 3 of 3 positions shown · non-contrast
Comparison: none

[wrist pa]
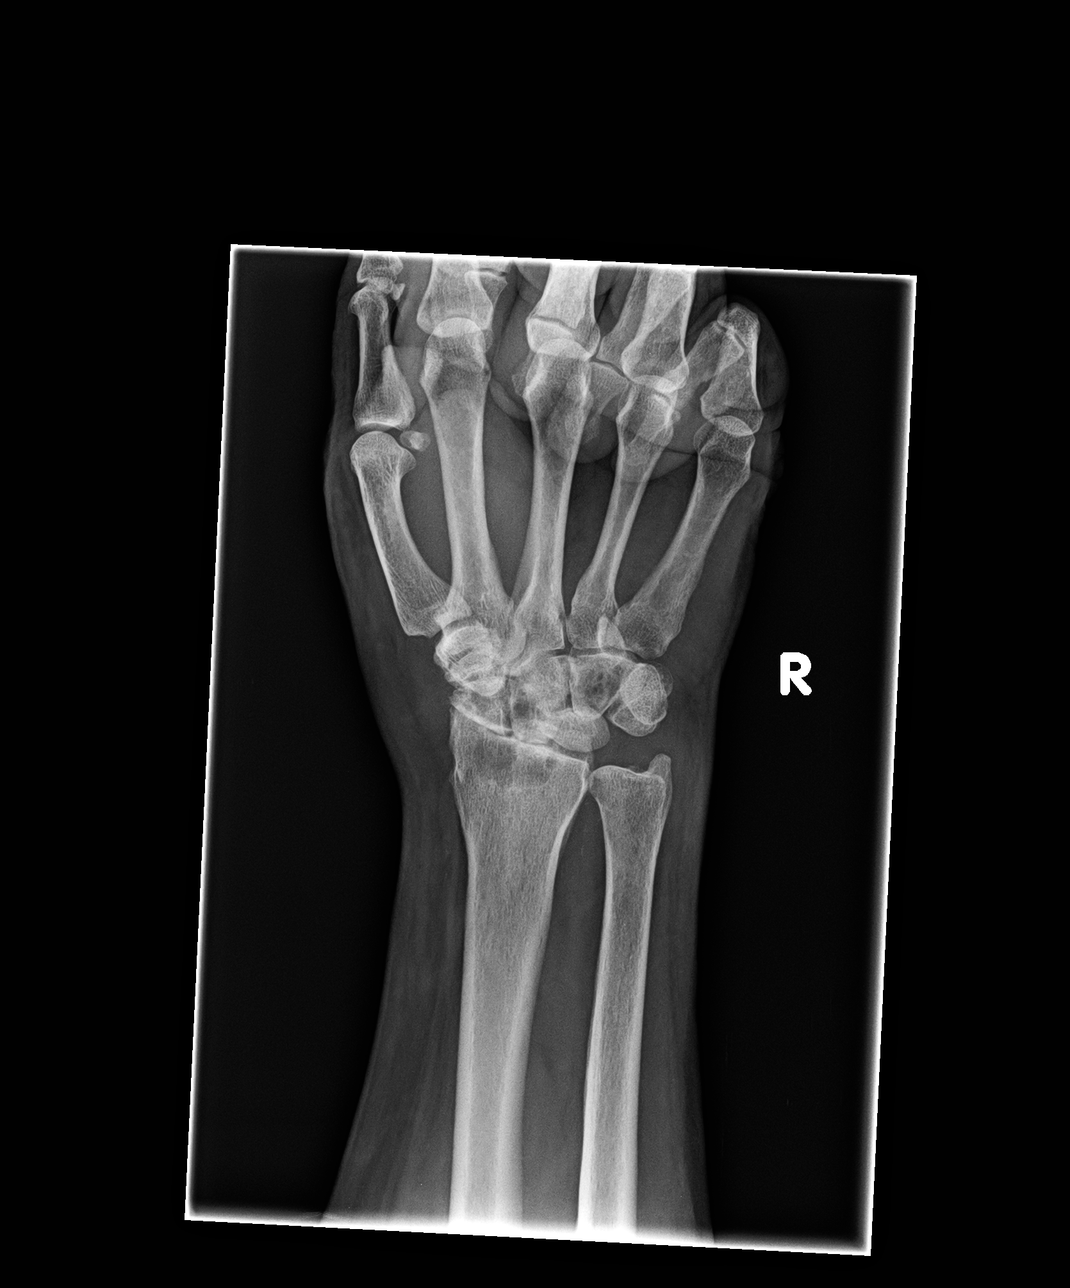

[wrist obl]
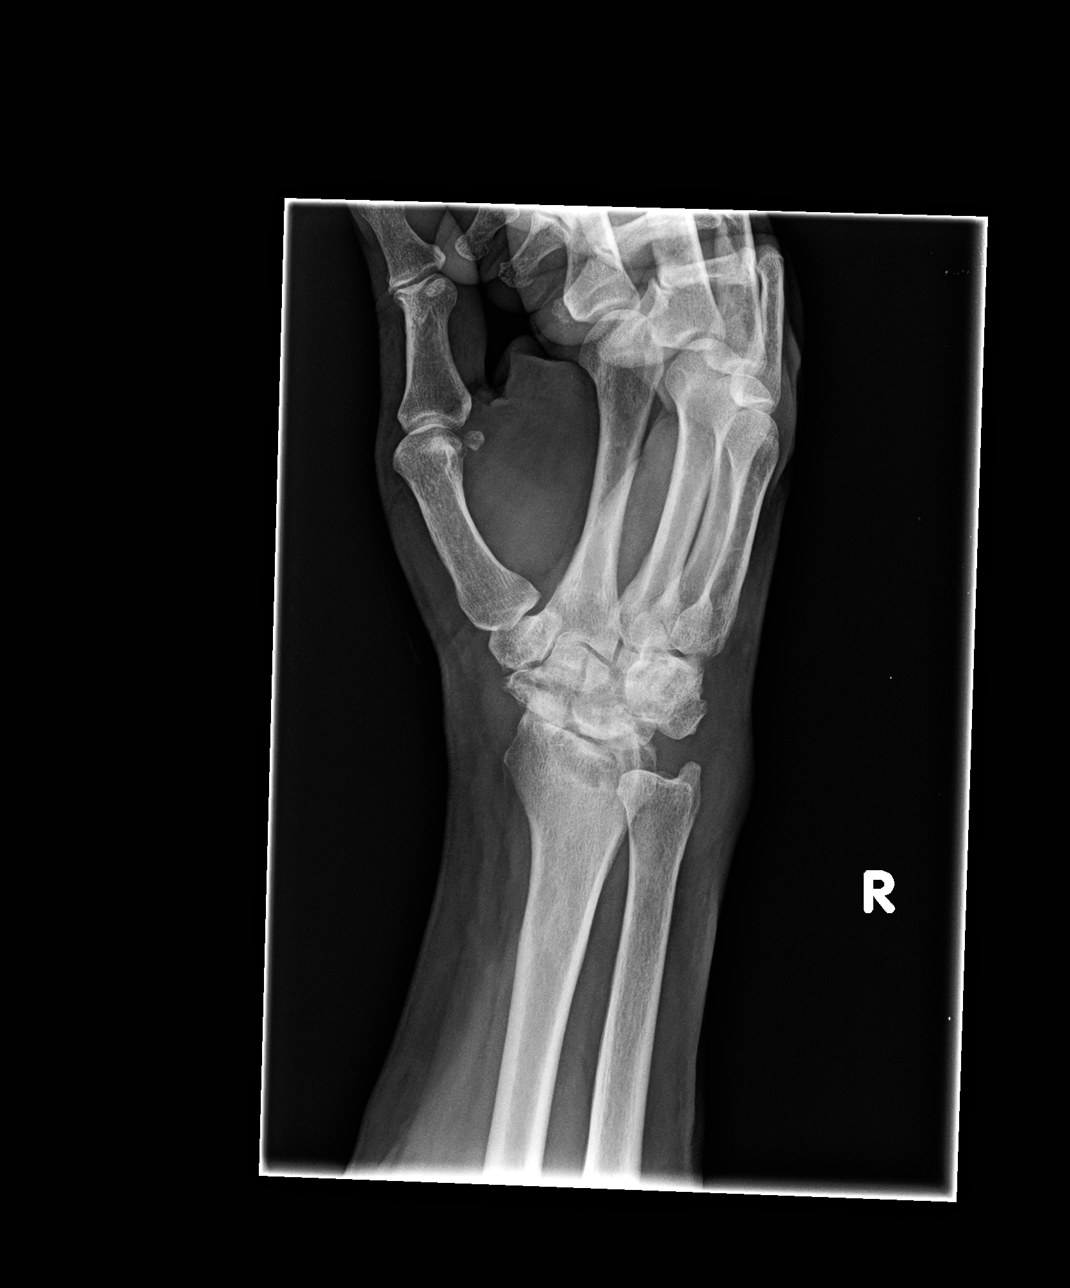

[wrist lat]
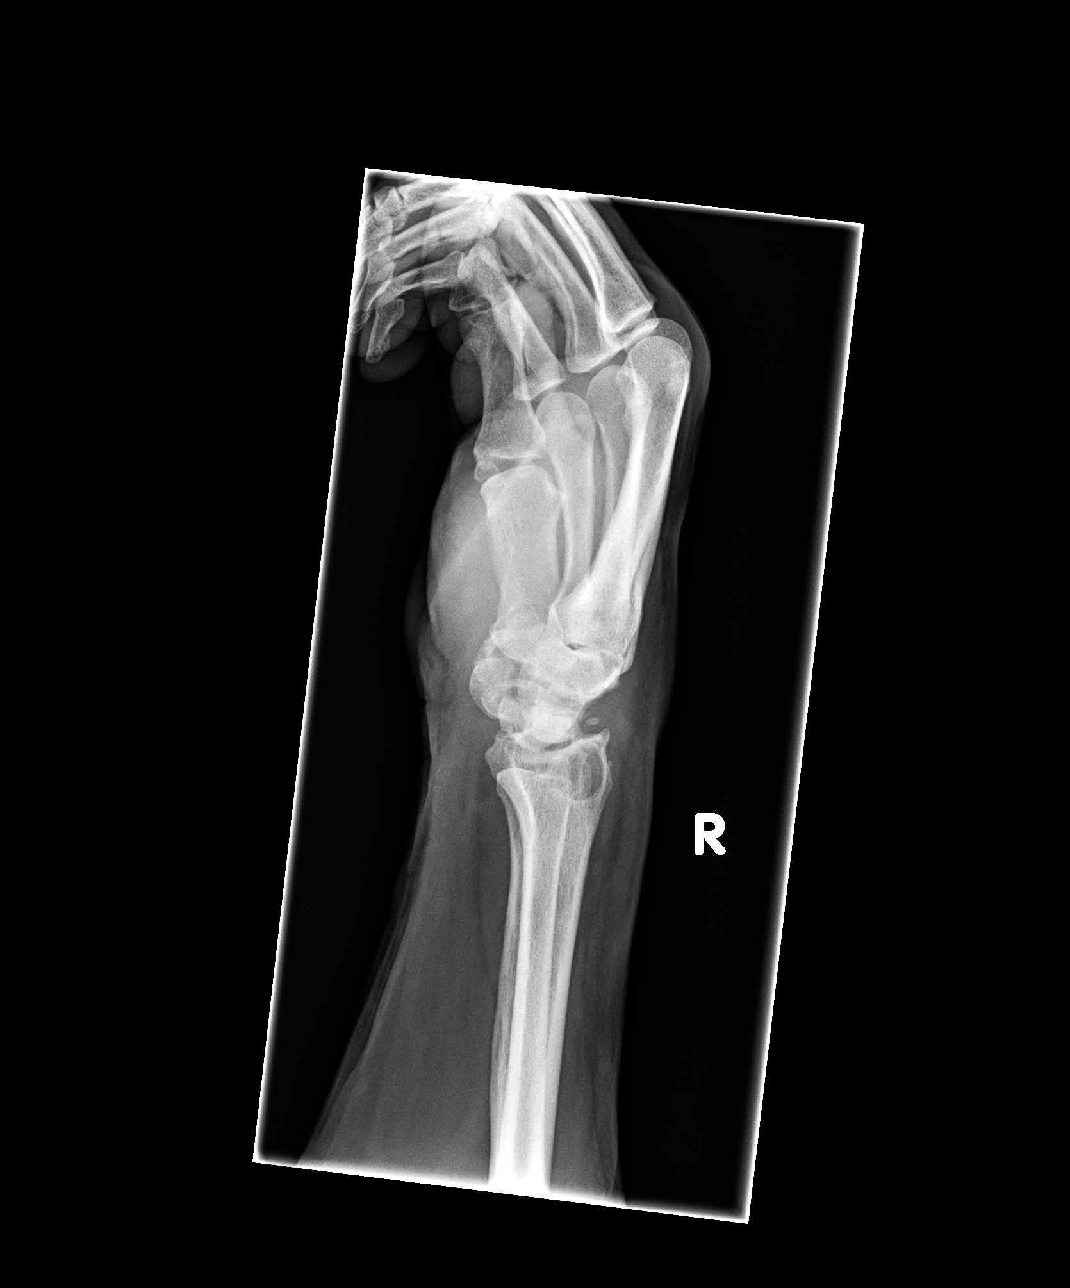

[3 of 3 positions shown; findings below may reference images not displayed]

Primary:     MCCOMB, ANKITA                                                        Date: 05/

DIAGNOSTIC STUDIES

EXAM

Right wrist:

INDICATION

right wrist pain
pt. c/o pain.

COMPARISONS

None.

FINDINGS

There is widening of the scapholunate distance with proximal migration of the capitate and mild
dorsal angulation of the lunate. Severe loss of joint space with sclerosis involving the radiocarpal
joint. No acute displaced fracture. Moderate size subchondral cyst involving the distal radius,
measuring 1.1 x 1.8 centimeters. There is mild soft tissue swelling.

IMPRESSION

1. Dorsal intercalated segment instability (DISI) with severe osteoarthritic changes within the
radiocarpal joint.

Follow up with an orthopedic consultation is recommended.

Tech Notes:

pt. c/o pain.

## 2019-09-28 ENCOUNTER — Encounter: Admit: 2019-09-28 | Discharge: 2019-09-28 | Payer: MEDICARE

## 2019-09-28 DIAGNOSIS — E669 Obesity, unspecified: Secondary | ICD-10-CM

## 2019-09-28 MED ORDER — METFORMIN 500 MG PO TAB
500 mg | ORAL_TABLET | Freq: Every day | ORAL | 0 refills | Status: DC
Start: 2019-09-28 — End: 2019-10-22

## 2019-10-19 ENCOUNTER — Encounter: Admit: 2019-10-19 | Discharge: 2019-10-19 | Payer: MEDICARE

## 2019-10-20 ENCOUNTER — Encounter: Admit: 2019-10-20 | Discharge: 2019-10-20 | Payer: MEDICARE

## 2019-10-20 ENCOUNTER — Ambulatory Visit: Admit: 2019-10-20 | Discharge: 2019-10-20 | Payer: MEDICARE

## 2019-10-20 DIAGNOSIS — R768 Other specified abnormal immunological findings in serum: Secondary | ICD-10-CM

## 2019-10-20 DIAGNOSIS — I428 Other cardiomyopathies: Secondary | ICD-10-CM

## 2019-10-20 DIAGNOSIS — E785 Hyperlipidemia, unspecified: Secondary | ICD-10-CM

## 2019-10-20 DIAGNOSIS — I10 Essential (primary) hypertension: Secondary | ICD-10-CM

## 2019-10-20 DIAGNOSIS — R600 Localized edema: Secondary | ICD-10-CM

## 2019-10-20 DIAGNOSIS — E559 Vitamin D deficiency, unspecified: Secondary | ICD-10-CM

## 2019-10-20 DIAGNOSIS — E1142 Type 2 diabetes mellitus with diabetic polyneuropathy: Secondary | ICD-10-CM

## 2019-10-20 DIAGNOSIS — N529 Male erectile dysfunction, unspecified: Secondary | ICD-10-CM

## 2019-10-20 DIAGNOSIS — E669 Obesity, unspecified: Secondary | ICD-10-CM

## 2019-10-20 DIAGNOSIS — Z0000 Encounter for general adult medical examination without abnormal findings: Secondary | ICD-10-CM

## 2019-10-20 DIAGNOSIS — G609 Hereditary and idiopathic neuropathy, unspecified: Secondary | ICD-10-CM

## 2019-10-20 DIAGNOSIS — I712 Thoracic aortic aneurysm, without rupture: Secondary | ICD-10-CM

## 2019-10-20 DIAGNOSIS — M1990 Unspecified osteoarthritis, unspecified site: Secondary | ICD-10-CM

## 2019-10-20 DIAGNOSIS — I493 Ventricular premature depolarization: Secondary | ICD-10-CM

## 2019-10-20 DIAGNOSIS — I2510 Atherosclerotic heart disease of native coronary artery without angina pectoris: Secondary | ICD-10-CM

## 2019-10-20 DIAGNOSIS — K760 Fatty (change of) liver, not elsewhere classified: Secondary | ICD-10-CM

## 2019-10-20 DIAGNOSIS — I351 Nonrheumatic aortic (valve) insufficiency: Secondary | ICD-10-CM

## 2019-10-20 DIAGNOSIS — M5416 Radiculopathy, lumbar region: Secondary | ICD-10-CM

## 2019-10-20 DIAGNOSIS — I2699 Other pulmonary embolism without acute cor pulmonale: Secondary | ICD-10-CM

## 2019-10-20 LAB — CBC AND DIFF
Lab: 0 10*3/uL (ref 0–0.20)
Lab: 0 10*3/uL (ref 0–0.45)
Lab: 0.5 10*3/uL (ref 0–0.80)
Lab: 1 % (ref >60)
Lab: 1.8 10*3/uL (ref 1.0–4.8)
Lab: 10 % (ref 4–12)
Lab: 10 FL (ref 7–11)
Lab: 141 10*3/uL — ABNORMAL LOW (ref 150–400)
Lab: 15 % — ABNORMAL HIGH (ref 11–15)
Lab: 15 g/dL (ref 13.5–16.5)
Lab: 2 % (ref >60)
Lab: 3 10*3/uL (ref 1.8–7.0)
Lab: 30 pg (ref 26–34)
Lab: 33 % (ref 24–44)
Lab: 34 g/dL — ABNORMAL HIGH (ref 32.0–36.0)
Lab: 44 % (ref 40–50)
Lab: 5 M/UL — ABNORMAL HIGH (ref 4.4–5.5)
Lab: 5.5 K/UL (ref 4.5–11.0)
Lab: 54 % (ref 41–77)
Lab: 87 FL (ref 80–100)

## 2019-10-20 LAB — COMPREHENSIVE METABOLIC PANEL
Lab: 138 MMOL/L — ABNORMAL LOW (ref >40)
Lab: 4.6 MMOL/L (ref <100)

## 2019-10-20 LAB — LIPID PROFILE
Lab: 140 mg/dL (ref <200)
Lab: 254 mg/dL — ABNORMAL HIGH (ref <150)

## 2019-10-20 LAB — MICROALB/CR RATIO-URINE RANDOM: Lab: 105 mg/dL (ref 24–44)

## 2019-10-20 LAB — 25-OH VITAMIN D (D2 + D3): Lab: 46 ng/mL (ref 30–80)

## 2019-10-20 LAB — PROSTATIC SPECIFIC ANTIGEN-PSA: Lab: 1.1 ng/mL (ref <4.01)

## 2019-10-20 MED ORDER — FUROSEMIDE 20 MG PO TAB
20 mg | ORAL_TABLET | Freq: Every day | ORAL | 1 refills | 90.00000 days | Status: AC | PRN
Start: 2019-10-20 — End: ?

## 2019-10-22 ENCOUNTER — Encounter: Admit: 2019-10-22 | Discharge: 2019-10-22 | Payer: MEDICARE

## 2019-10-22 DIAGNOSIS — E785 Hyperlipidemia, unspecified: Secondary | ICD-10-CM

## 2019-10-22 DIAGNOSIS — E119 Type 2 diabetes mellitus without complications: Secondary | ICD-10-CM

## 2019-10-22 MED ORDER — EZETIMIBE 10 MG PO TAB
10 mg | ORAL_TABLET | Freq: Every day | ORAL | 0 refills | Status: AC
Start: 2019-10-22 — End: ?

## 2019-10-22 MED ORDER — METFORMIN 500 MG PO TAB
1000 mg | ORAL_TABLET | Freq: Every day | ORAL | 2 refills | Status: AC
Start: 2019-10-22 — End: ?

## 2019-10-22 NOTE — Telephone Encounter
Future lab orders placed, medication sent to pharmacy

## 2019-10-22 NOTE — Telephone Encounter
-----   Message from Bufford Buttner, MD sent at 10/22/2019  9:30 AM CDT -----  Please notify patient that his HgbA1c was increased over last year.  I'd like him to double his metformin to 1000 mg a day and work to improve his diet where able.  His triglycerides were also elevated and I'd like to add zetia at 10mg  qday to his statin.  If he feels a referral to the dietician is needed please assist him with this.  I'd like him to return for a visit in 3 months and we can repeat a fasting lipid profile and HgbA1c

## 2019-10-23 ENCOUNTER — Encounter: Admit: 2019-10-23 | Discharge: 2019-10-23 | Payer: MEDICARE

## 2019-10-23 DIAGNOSIS — N529 Male erectile dysfunction, unspecified: Secondary | ICD-10-CM

## 2019-10-23 DIAGNOSIS — I351 Nonrheumatic aortic (valve) insufficiency: Secondary | ICD-10-CM

## 2019-10-23 DIAGNOSIS — I2699 Other pulmonary embolism without acute cor pulmonale: Secondary | ICD-10-CM

## 2019-10-23 DIAGNOSIS — E785 Hyperlipidemia, unspecified: Secondary | ICD-10-CM

## 2019-10-23 DIAGNOSIS — I712 Thoracic aortic aneurysm, without rupture: Secondary | ICD-10-CM

## 2019-10-23 DIAGNOSIS — I2510 Atherosclerotic heart disease of native coronary artery without angina pectoris: Secondary | ICD-10-CM

## 2019-10-23 DIAGNOSIS — I493 Ventricular premature depolarization: Secondary | ICD-10-CM

## 2019-10-23 DIAGNOSIS — K760 Fatty (change of) liver, not elsewhere classified: Secondary | ICD-10-CM

## 2019-10-23 DIAGNOSIS — I10 Essential (primary) hypertension: Secondary | ICD-10-CM

## 2019-10-23 DIAGNOSIS — G609 Hereditary and idiopathic neuropathy, unspecified: Secondary | ICD-10-CM

## 2019-10-23 DIAGNOSIS — M1990 Unspecified osteoarthritis, unspecified site: Secondary | ICD-10-CM

## 2019-10-23 DIAGNOSIS — E669 Obesity, unspecified: Secondary | ICD-10-CM

## 2019-10-23 DIAGNOSIS — M5416 Radiculopathy, lumbar region: Secondary | ICD-10-CM

## 2019-10-23 DIAGNOSIS — R768 Other specified abnormal immunological findings in serum: Secondary | ICD-10-CM

## 2019-10-23 DIAGNOSIS — I428 Other cardiomyopathies: Secondary | ICD-10-CM

## 2019-11-19 ENCOUNTER — Encounter: Admit: 2019-11-19 | Discharge: 2019-11-19 | Payer: MEDICARE

## 2019-11-19 NOTE — Telephone Encounter
Pt would like to transfer care to [facility]. Says they told him he needs Dr Sunday Corn to release him. Last seen by Dr Sunday Corn 05/23/2018. Advised Pt that I didn't think it would be an issue, but would check with Dr Sunday Corn to see if there is a specific process for releasing him. Pt understands that I will try to get back with him tomorrow.

## 2019-11-24 ENCOUNTER — Encounter: Admit: 2019-11-24 | Discharge: 2019-11-24 | Payer: MEDICARE

## 2019-11-25 ENCOUNTER — Encounter: Admit: 2019-11-25 | Discharge: 2019-11-25 | Payer: MEDICARE

## 2019-11-25 DIAGNOSIS — M5410 Radiculopathy, site unspecified: Secondary | ICD-10-CM

## 2019-11-25 DIAGNOSIS — G629 Polyneuropathy, unspecified: Secondary | ICD-10-CM

## 2019-11-30 ENCOUNTER — Encounter: Admit: 2019-11-30 | Discharge: 2019-11-30 | Payer: MEDICARE

## 2019-11-30 MED ORDER — RIVAROXABAN 20 MG PO TAB
20 mg | ORAL_TABLET | Freq: Every day | ORAL | 3 refills | 30.00000 days | Status: AC
Start: 2019-11-30 — End: ?

## 2019-12-09 ENCOUNTER — Encounter: Admit: 2019-12-09 | Discharge: 2019-12-09 | Payer: MEDICARE

## 2019-12-09 ENCOUNTER — Ambulatory Visit: Admit: 2019-12-09 | Discharge: 2019-12-10 | Payer: MEDICARE

## 2019-12-09 NOTE — Progress Notes
Date of Service: 12/09/2019    Subjective:             Victor Graham is a 69 y.o. male.    History of Present Illness  Victor Graham is here for his follow up of pain and multiple issues with difficulty walking. Since the last visit, he reports of having the Ulnar nerve and CTS surgery on the left. Pain in the back is continuing. He has the Spinal cord stimulator which he feels is not beneficial and continues to use it without any changes. He reports that Nevro was tested and [employer] device placed. Both feet are numb. He is using walker presently. He is doing physical therapy. He is usually using a cane for distance of 50-75 yards and then has to rest. With walker he is able to walk 100 yards. He has pain in the arms and shoulders after that. Sitting usually causes pain about 0/10 in the back, is usually worse in the morning 5/10, after starting to walk it is 2/10, bending over to brush teeth makes the pain worse in the back. He has occassional pain in the legs and lyrica helps. He reports of pain in the right knee, which was replaced, right hip hurta , no radiation of pain from the back into the leg presently. He reports of having some weight loss which is intentional.    Allergies   Allergen Reactions   ? Other [Unclassified Drug] BLISTERS     dissolving stiches in mouth after dental surgery caused blisters   ? Lipitor [Atorvastatin] FLUSHING (SKIN)   ? Niaspan [Niacin] FLUSHING (SKIN)   ? Rubber SEE COMMENTS     Neoprene rubber causes contact dermatitis (CAUSED BY THIOUREA IN NEOPRENE)          Review of Systems   HENT: Positive for tinnitus.    Genitourinary: Positive for difficulty urinating and testicular pain.   Musculoskeletal: Positive for arthralgias, back pain and gait problem.   All other systems reviewed and are negative.        Objective:         ? carvediloL (COREG) 6.25 mg tablet Take one tablet by mouth twice daily.   ? ezetimibe (ZETIA) 10 mg tablet Take one tablet by mouth daily.   ? furosemide (LASIX) 20 mg tablet Take one tablet by mouth daily as needed.   ? GLUCOSAMINE HCL PO Take 1,000 mg by mouth twice daily.   ? lisinopriL (ZESTRIL) 10 mg tablet Take one tablet by mouth daily.   ? metFORMIN (GLUCOPHAGE) 500 mg tablet Take two tablets by mouth daily with breakfast.   ? pregabalin (LYRICA) 200 mg capsule Take one capsule by mouth twice daily.   ? rivaroxaban (XARELTO) 20 mg tablet Take one tablet by mouth daily with dinner.   ? rosuvastatin (CRESTOR) 20 mg tablet Take one tablet by mouth daily.   ? SAW PALMETTO PO Take  by mouth.   ? spironolactone (ALDACTONE) 25 mg tablet Take one tablet by mouth daily. Please call to schedule office visit with Dr Iver Nestle   ? tiZANidine (ZANAFLEX) 2 mg tablet TAKE 1 TO 2 TABLETS THREE TIMES A DAY AS NEEDED   ? traMADol (ULTRAM) 50 mg tablet Take 50 mg by mouth every 8 hours as needed.     Vitals:    12/09/19 1006   BP: 129/84   BP Source: Arm, Left Upper   Patient Position: Sitting   Pulse: 75   Weight: (P) 133.7 kg (  294 lb 12.8 oz)   Height: (P) 177.8 cm (70)   PainSc: Two     Body mass index is 42.3 kg/m? (pended).     Physical Exam      Physical Exam   General physical exam:  General: Alert, cooperative, no distress, appears stated age   HEENT: Normocephalic, eyes open with no discharge, nares patent, oropharynx is clear with no lesions, palate intact  CV: Regular rate and rhythm, no murmur, distal pulses palpable  Chest: lungs are clear bilaterally  Ab:  soft, non-tender, non-distended, no masses, no organomegaly   Extremities: Normal, atraumatic, no cyanosis, clubbing or edema   Skin: No rashes or lesions   ?  Neurological examination:   Mental Status: Alert, oriented to time, place and person   CN II thru XII: Pupils 3mm, reactive to light and accommodation, intact extraocular movements, intact facial sensation,  intact hearing, symmetric palatal elevation, able to shrug shoulders equally on both sides, tongue midline.  ?  Palpebral Fissure 9/48mm Orb Oculi 5/5   Orb Oris 5/5   Tongue  5/5   ?  Speech: No Dysarthria  Motor Exam:  ?  Neck flexors: 5   Neck extensors: 5   ?  ? Right Left ? Right Left   Shoulder abductors: 5 5 Hip flexion: 5 5   Elbow flexors: 5 5 Hip abduction: 5 5   Elbow extensors: 5 5 Hip Adduction: 5 5   Wrist extensors: 5 5 Knee extension: 5 5   Wrist flexors: 5 5 Knee flexion: 5 5   Finger flexors: 5 5 Ankle dorsiflexion: 4 5-   Finger extensors: 5 5 Ankle plantar flexion: 4 4+   Finger abductors: 4+ FDI 5, ADM 4+ Ankle eversion: 4 5   Thumb abductors: 4+ 4+ Ankle inversion: 5 5   ? ? ? Toe extension: 1-2 3-   ? ? ? Toe flexion: 3 3   ?  No high arches or hammer toes  Tinel sign positive over left median nerve over the wrist and right ulnar nerve at the elbow.  Progression of left ulnar nerve at the elbow produced paresthesias in the left third digit atypically.  ?  Sensory: Pinprick diminished up to mid shins bilaterally with relatively greater diminution along the lateral foot and distal foreleg left worse than right.  Patchy loss of vibration in the feet with some asymmetric, but proprioception intact.  Intact in upper extremities  Coordination: Normal finger to nose bilaterally.  Reflexes:   Reflex   Right   Left     Brachioradialis   1 1   Biceps    1 1   Triceps 0 0   Knee 0 0   Ankle   1 w/ Jendrassik 0   Jaw Jerk ?   ?  ? Right   Left     Plantar response mute mute   Hoffman?s  absent absent   ?  Gait and Station: wide-based antalgic gait. unable to heel walk, toe walk or tandem walk  ?  Vitamin B12 : 347, ESR 20, CBC, normal, on 10/02/2018, SIFE was normal, copper is normal, MMA was normal, hemoglobin A1C 6.3    Assessment and Plan:  Victor. Victor Graham has symptoms suggestive of the following diagnosis   1.  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  Diabetic distal sensorimotor polyneuropathy, continuing symptoms    PLAN:  1) AFO's bilateral feet  2) control blood sugars  3) Recommend weight loss  4) Lyrica 200 mg bid, express scripts. He would like [place] to refill this medication which he was getting through Dr Tamsen Roers office previously, however he has not seen Dr Samara Deist for almost a year  5) Topical capsaicin, or lidocaine gel, menthol or asper cream over the counter  6) Followed by Dr Samara Deist for back issues  7) RTC in 6months, earlier if symptoms    Bing Quarry, MD  Professor  Department of Neurology                      Total of 40 minutes were spent on the same day of the visit including preparing to see the patient, obtaining and/or reviewing separately obtained history, performing a medically appropriate examination and/or evaluation, counseling and educating the patient/family/caregiver, ordering medications, tests, or procedures, referring and communication with other health care professionals, documenting clinical information in the electronic or other health record, independently interpreting results and communicating results to the patient/family/caregiver, and care coordination

## 2019-12-09 NOTE — Patient Instructions
1) AFO's bilateral feet  2) control blood sugars  3) Recommend weight loss  4) Lyrica 200 mg bid, express scripts  5) Topical capsaicin, or lidocaine gel, menthol or asper cream over the counter  6) Followed by Dr Samara Deist for back issues  7) RTC in 6months, earlier if symptoms

## 2019-12-10 DIAGNOSIS — M5410 Radiculopathy, site unspecified: Secondary | ICD-10-CM

## 2019-12-10 DIAGNOSIS — G629 Polyneuropathy, unspecified: Secondary | ICD-10-CM

## 2019-12-11 ENCOUNTER — Encounter: Admit: 2019-12-11 | Discharge: 2019-12-11 | Payer: MEDICARE

## 2019-12-11 MED ORDER — PREGABALIN 200 MG PO CAP
200 mg | ORAL_CAPSULE | Freq: Two times a day (BID) | ORAL | 3 refills | Status: AC
Start: 2019-12-11 — End: ?

## 2019-12-17 ENCOUNTER — Encounter: Admit: 2019-12-17 | Discharge: 2019-12-17 | Payer: MEDICARE

## 2019-12-18 ENCOUNTER — Encounter: Admit: 2019-12-18 | Discharge: 2019-12-18 | Payer: MEDICARE

## 2019-12-18 MED ORDER — PREGABALIN 200 MG PO CAP
200 mg | ORAL_CAPSULE | Freq: Two times a day (BID) | ORAL | 3 refills | Status: AC
Start: 2019-12-18 — End: ?

## 2019-12-24 ENCOUNTER — Encounter: Admit: 2019-12-24 | Discharge: 2019-12-24 | Payer: MEDICARE

## 2019-12-24 ENCOUNTER — Ambulatory Visit: Admit: 2019-12-24 | Discharge: 2019-12-24 | Payer: MEDICARE

## 2019-12-24 DIAGNOSIS — N529 Male erectile dysfunction, unspecified: Secondary | ICD-10-CM

## 2019-12-24 DIAGNOSIS — R768 Other specified abnormal immunological findings in serum: Secondary | ICD-10-CM

## 2019-12-24 DIAGNOSIS — I351 Nonrheumatic aortic (valve) insufficiency: Secondary | ICD-10-CM

## 2019-12-24 DIAGNOSIS — G609 Hereditary and idiopathic neuropathy, unspecified: Secondary | ICD-10-CM

## 2019-12-24 DIAGNOSIS — E785 Hyperlipidemia, unspecified: Secondary | ICD-10-CM

## 2019-12-24 DIAGNOSIS — R490 Dysphonia: Secondary | ICD-10-CM

## 2019-12-24 DIAGNOSIS — I493 Ventricular premature depolarization: Secondary | ICD-10-CM

## 2019-12-24 DIAGNOSIS — I2510 Atherosclerotic heart disease of native coronary artery without angina pectoris: Secondary | ICD-10-CM

## 2019-12-24 DIAGNOSIS — M5416 Radiculopathy, lumbar region: Secondary | ICD-10-CM

## 2019-12-24 DIAGNOSIS — I712 Thoracic aortic aneurysm, without rupture: Secondary | ICD-10-CM

## 2019-12-24 DIAGNOSIS — I428 Other cardiomyopathies: Secondary | ICD-10-CM

## 2019-12-24 DIAGNOSIS — M1990 Unspecified osteoarthritis, unspecified site: Secondary | ICD-10-CM

## 2019-12-24 DIAGNOSIS — K760 Fatty (change of) liver, not elsewhere classified: Secondary | ICD-10-CM

## 2019-12-24 DIAGNOSIS — I2699 Other pulmonary embolism without acute cor pulmonale: Secondary | ICD-10-CM

## 2019-12-24 DIAGNOSIS — E669 Obesity, unspecified: Secondary | ICD-10-CM

## 2019-12-24 DIAGNOSIS — I10 Essential (primary) hypertension: Secondary | ICD-10-CM

## 2019-12-24 DIAGNOSIS — J383 Other diseases of vocal cords: Secondary | ICD-10-CM

## 2019-12-24 NOTE — Progress Notes
Date of Service: 12/24/2019    Subjective:             Victor Graham is a 69 y.o. male.    History of Present Illness    Victor Graham is a very pleasant 69 y.o. gentleman who was referred by Dr. Alonza Smoker for a complaint of dysphonia.    ?  Victor Graham presented in a one year history of dysphonia in September 2020.  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  Given the options of surgery, therapy or observation, he elected observation.  ?  Victor Graham returns today in follow up.   He reports his voice is actually been pretty good of late.  Specifically, he notes that his wife reports that his voice has been improved.  His main issues currently orthopedic in nature.  He is hoping a carpal tunnel surgery for his left hand in the near future.  He continues to follow with the pulmonary clinic for his interstitial lung disease.  His breathing has been stable.  He also continues to follow with cardiology, and reports that his AAA repair has been stable.       Review of Systems   Constitutional: Negative.    HENT: Positive for voice change.    Eyes: Negative.    Respiratory: Negative.    Cardiovascular: Negative.    Gastrointestinal: Negative.    Endocrine: Negative.    Genitourinary: Negative.    Musculoskeletal: Positive for arthralgias.   Skin: Negative.    Allergic/Immunologic: Negative.    Neurological: Negative.    Hematological: Negative.    Psychiatric/Behavioral: Negative.          Objective:         ? carvediloL (COREG) 6.25 mg tablet Take one tablet by mouth twice daily.   ? ezetimibe (ZETIA) 10 mg tablet Take one tablet by mouth daily.   ? furosemide (LASIX) 20 mg tablet Take one tablet by mouth daily as needed.   ? GLUCOSAMINE HCL PO Take 1,000 mg by mouth twice daily.   ? lisinopriL (ZESTRIL) 10 mg tablet Take one tablet by mouth daily.   ? metFORMIN (GLUCOPHAGE) 500 mg tablet Take two tablets by mouth daily with breakfast.   ? pregabalin (LYRICA) 200 mg capsule Take one capsule by mouth twice daily.   ? rivaroxaban (XARELTO) 20 mg tablet Take one tablet by mouth daily with dinner.   ? rosuvastatin (CRESTOR) 20 mg tablet Take one tablet by mouth daily.   ? SAW PALMETTO PO Take  by mouth.   ? spironolactone (ALDACTONE) 25 mg tablet Take one tablet by mouth daily. Please call to schedule office visit with Dr Iver Nestle   ? tiZANidine (ZANAFLEX) 2 mg tablet TAKE 1 TO 2 TABLETS THREE TIMES A DAY AS NEEDED   ? traMADol (ULTRAM) 50 mg tablet Take 50 mg by mouth every 8 hours as needed.     Vitals:    12/24/19 1116   BP: 134/80   Pulse: 76   Weight: 127 kg (280 lb)   Height: 177.8 cm (70)   PainSc: Zero     Body mass index is 40.18 kg/m?Marland Kitchen     Physical Exam    EXAM:  Constitutional: Blood pressure 134/80, pulse 76, height 177.8 cm (70), weight 127 kg (280 lb).    alert, cooperative and morbidly obese  Neurologic: Alert and oriented x 3.    Cranial nerves II -  XII grossly intact and symmetric.     VOICE EVALUATION:  Voice is graded G1 R1 B0 A0 S0.    VHI-10  My voice makes it difficult for people to hear me.  1   People have difficulty understanding me in a noisy room. 1   My voice difficulties restrict personal and social life. 0   I feel left out of conversations because of my voice.  0   My voice problem causes me to lose income.  0   I feel as through I have to strain to produce voice. 0   The clarity of my voice is unpredictable.  1   My voice problem upsets me.  0   My voice makes me feel handicapped.  0   People ask, ?What?s wrong with your voice??  1   Total:  4     LARYNGOSCOPY:  After applying topical decongestant and anesthetic, flexible laryngoscopy was performed using a fiberoptic scope through the right nare.  The nasal passage was clear and without polyposis or mucopurulent secretions.  The nasopharyngeal mucosa appeared normal.  The fossa of Rosenmuller was clear and the eustachian tube openings were without mass or lesion. The tongue base was symmetric and without mucosal mass or lesion.  The vallecula was clear.  The epiglottis was upright.  The aryepiglottic folds and false folds appeared normal and were without mucosal mass or lesion.  The visualized hypopharynx was unremarkable.  The true vocal folds were without discernable mucosal or submucosal lesions.  The right and left cords adducted and abducted fully.  Though difficult to assess without stroboscopy, closure appeared to be complete.  A limited view of the subglottis revealed no mucosal lesions or stenosis.    Pulmonary Clinic Note Dr. Lorna Few 07/07/19:  PROBLEM LIST:  1.  Interstitial Lung Disease  2.  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  Positive Victor  4.  History of VTE  5.  Mild restrictive lung disease  PLAN:  1. No indication for treatment at this time. Will continue to monitor and consider starting treatment if decline is noted.   2. Encourage exercise.   3. Follow up in 6 months with PFTs.        Assessment and Plan:    ASSESSMENT:  Encounter Diagnoses   Name Primary?   ? Vocal fold cyst Yes   ? Dysphonia        PLAN:    I have reviewed my findings with Victor Graham.  Subjectively, his voice has been improving.  I see no evidence of the right anterior vocal fold cyst on exam today.  We discussed red flag symptoms which should prompt him to return to my clinic for evaluation.  Otherwise I plan to see him back as needed.

## 2019-12-25 ENCOUNTER — Encounter: Admit: 2019-12-25 | Discharge: 2019-12-25 | Payer: MEDICARE

## 2019-12-25 NOTE — Progress Notes
LMN faxed to [facility], confirmation received, documented and sent to MR for scanning.

## 2019-12-28 ENCOUNTER — Encounter: Admit: 2019-12-28 | Discharge: 2019-12-28 | Payer: MEDICARE

## 2019-12-28 NOTE — Telephone Encounter
Lauren with [facility] asked for the office notes from Dr. Meredeth Ide re: the AFO need to be faxed to [phone].   Office notes faxed electronically. Confirmation received.

## 2020-01-22 ENCOUNTER — Ambulatory Visit: Admit: 2020-01-22 | Discharge: 2020-01-22 | Payer: MEDICARE

## 2020-01-22 ENCOUNTER — Encounter: Admit: 2020-01-22 | Discharge: 2020-01-22 | Payer: MEDICARE

## 2020-01-22 DIAGNOSIS — E785 Hyperlipidemia, unspecified: Secondary | ICD-10-CM

## 2020-01-22 DIAGNOSIS — E119 Type 2 diabetes mellitus without complications: Secondary | ICD-10-CM

## 2020-01-22 LAB — COMPREHENSIVE METABOLIC PANEL
Lab: 1.4 mg/dL — ABNORMAL HIGH (ref 0.3–1.2)
Lab: 137 MMOL/L (ref 137–147)
Lab: 24 MMOL/L (ref 21–30)
Lab: 29 U/L (ref 7–40)
Lab: 36 U/L (ref 25–110)
Lab: 37 U/L (ref 7–56)
Lab: 4.3 g/dL (ref 3.5–5.0)
Lab: 4.6 MMOL/L (ref 3.5–5.1)
Lab: >60 mL/min (ref >60)
Lab: >60 mL/min (ref >60)
Lab: 11 (ref 3–12)

## 2020-01-22 LAB — LIPID PROFILE
Lab: 153 mg/dL — ABNORMAL HIGH (ref <150)
Lab: 31 mg/dL (ref 8.5–10.6)
Lab: 36 mg/dL — ABNORMAL LOW (ref >40)
Lab: 43 mg/dL (ref <100)
Lab: 58 mg/dL (ref 6.0–8.0)
Lab: 94 mg/dL (ref <200)

## 2020-01-24 ENCOUNTER — Encounter: Admit: 2020-01-24 | Discharge: 2020-01-24 | Payer: MEDICARE

## 2020-01-25 ENCOUNTER — Encounter: Admit: 2020-01-25 | Discharge: 2020-01-25 | Payer: MEDICARE

## 2020-01-25 DIAGNOSIS — I10 Essential (primary) hypertension: Secondary | ICD-10-CM

## 2020-01-25 DIAGNOSIS — E785 Hyperlipidemia, unspecified: Secondary | ICD-10-CM

## 2020-01-25 DIAGNOSIS — E119 Type 2 diabetes mellitus without complications: Secondary | ICD-10-CM

## 2020-01-25 MED ORDER — EZETIMIBE 10 MG PO TAB
10 mg | ORAL_TABLET | Freq: Every day | ORAL | 0 refills | Status: AC
Start: 2020-01-25 — End: ?

## 2020-01-25 MED ORDER — METFORMIN 1,000 MG PO TAB
1000 mg | ORAL_TABLET | Freq: Two times a day (BID) | ORAL | 3 refills | Status: AC
Start: 2020-01-25 — End: ?

## 2020-01-25 MED ORDER — LISINOPRIL 10 MG PO TAB
10 mg | ORAL_TABLET | Freq: Every day | ORAL | 2 refills | Status: AC
Start: 2020-01-25 — End: ?

## 2020-02-07 ENCOUNTER — Encounter: Admit: 2020-02-07 | Discharge: 2020-02-07 | Payer: MEDICARE

## 2020-02-08 ENCOUNTER — Encounter: Admit: 2020-02-08 | Discharge: 2020-02-08 | Payer: MEDICARE

## 2020-02-08 MED ORDER — CARVEDILOL 6.25 MG PO TAB
6.25 mg | ORAL_TABLET | Freq: Two times a day (BID) | ORAL | 1 refills | 90.00000 days | Status: AC
Start: 2020-02-08 — End: ?

## 2020-02-24 ENCOUNTER — Encounter: Admit: 2020-02-24 | Discharge: 2020-02-24 | Payer: MEDICARE

## 2020-02-26 ENCOUNTER — Encounter: Admit: 2020-02-26 | Discharge: 2020-02-26 | Payer: MEDICARE

## 2020-02-26 ENCOUNTER — Ambulatory Visit: Admit: 2020-02-26 | Discharge: 2020-02-26 | Payer: MEDICARE

## 2020-02-26 DIAGNOSIS — I10 Essential (primary) hypertension: Secondary | ICD-10-CM

## 2020-02-26 DIAGNOSIS — I2510 Atherosclerotic heart disease of native coronary artery without angina pectoris: Secondary | ICD-10-CM

## 2020-02-26 DIAGNOSIS — B356 Tinea cruris: Secondary | ICD-10-CM

## 2020-02-26 DIAGNOSIS — G8929 Other chronic pain: Secondary | ICD-10-CM

## 2020-02-26 DIAGNOSIS — I351 Nonrheumatic aortic (valve) insufficiency: Secondary | ICD-10-CM

## 2020-02-26 DIAGNOSIS — M25551 Pain in right hip: Principal | ICD-10-CM

## 2020-02-26 DIAGNOSIS — E669 Obesity, unspecified: Secondary | ICD-10-CM

## 2020-02-26 DIAGNOSIS — M79604 Pain in right leg: Secondary | ICD-10-CM

## 2020-02-26 DIAGNOSIS — I493 Ventricular premature depolarization: Secondary | ICD-10-CM

## 2020-02-26 DIAGNOSIS — K760 Fatty (change of) liver, not elsewhere classified: Secondary | ICD-10-CM

## 2020-02-26 DIAGNOSIS — I712 Thoracic aortic aneurysm, without rupture: Secondary | ICD-10-CM

## 2020-02-26 DIAGNOSIS — E119 Type 2 diabetes mellitus without complications: Secondary | ICD-10-CM

## 2020-02-26 DIAGNOSIS — M5416 Radiculopathy, lumbar region: Secondary | ICD-10-CM

## 2020-02-26 DIAGNOSIS — M1990 Unspecified osteoarthritis, unspecified site: Secondary | ICD-10-CM

## 2020-02-26 DIAGNOSIS — I428 Other cardiomyopathies: Secondary | ICD-10-CM

## 2020-02-26 DIAGNOSIS — E785 Hyperlipidemia, unspecified: Secondary | ICD-10-CM

## 2020-02-26 DIAGNOSIS — R768 Other specified abnormal immunological findings in serum: Secondary | ICD-10-CM

## 2020-02-26 DIAGNOSIS — I2699 Other pulmonary embolism without acute cor pulmonale: Secondary | ICD-10-CM

## 2020-02-26 DIAGNOSIS — N529 Male erectile dysfunction, unspecified: Secondary | ICD-10-CM

## 2020-02-26 DIAGNOSIS — G609 Hereditary and idiopathic neuropathy, unspecified: Secondary | ICD-10-CM

## 2020-02-26 MED ORDER — MELOXICAM 15 MG PO TAB
7.5-15 mg | ORAL_TABLET | Freq: Every day | ORAL | 3 refills | 30.00000 days | Status: AC | PRN
Start: 2020-02-26 — End: ?

## 2020-02-26 MED ORDER — TERBINAFINE HCL 250 MG PO TAB
250 mg | ORAL_TABLET | Freq: Every day | ORAL | 0 refills | 29.00000 days | Status: AC
Start: 2020-02-26 — End: ?

## 2020-02-26 NOTE — Progress Notes
Date of Service: 02/26/2020    Victor Graham is a 69 y.o. male.  DOB: 21-Aug-1950  MRN: [id number]     Subjective:             History of Present Illness    Patient is here for a complaint of right leg and hip pain.  He has a chronic history of low back pain, lumbar radiculopathy and chronic pain but is not sure if this is related.  He also has a chronic history of right knee pain and has had a subsequent right TKA.  He denies any recent falls or traumas.  He reports the current pain starts near the medial aspect of his proximal leg.  He also has a chronic history of scrotum swelling and is followed by Urology.      Medical History:   Diagnosis Date   ? Arthritis    ? Ascending aortic aneurysm (HCC) 01/08/2008   ? Asymptomatic PVCs 03/11/2015    03/2015 per pt, has had them for a long time & does not feel them usually    ? CAD (coronary artery disease)    ? Dyslipidemia    ? Erectile dysfunction    ? Fatty liver    ? HLD (hyperlipidemia) 01/08/2008   ? Hypertension 01/08/2008   ? Idiopathic polyneuropathy    ? Lumbar radiculopathy    ? NICM (nonischemic cardiomyopathy) (HCC)    ? 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 Obesity    ? Positive ANA (antinuclear antibody) 02/03/2016   ? Positive ANA (antinuclear antibody) 02/03/2016   ? Pulmonary embolism [facility]) 2015 -- july 20th    takes xaralto anticoagulant -- treated at [facility]     Surgical History:   Procedure Laterality Date   ? FASCIOTOMY Right 1992   ? KNEE SURGERY Right 02/2007    Torn meniscus removed   ? HX LUMBAR FUSION  02/2008    L4-5   ? AAA REPAIR  06/2008   ? HX JOINT REPLACEMENT Right 06/2010   ? HERNIA REPAIR  01/2013    x2   ? KNEE REPLACEMENT Left 03/2013   ? CYST REMOVAL Left 06/2013    wrist   ? STIMULATOR IMPLANT  12/2014    Spinal stimulator [employer]   ? CYST REMOVAL Right 08/2017    wrist   ? SHOULDER SURGERY  08/2018    reversed arthroplasty   ? COLONOSCOPY DIAGNOSTIC WITH SPECIMEN COLLECTION BY BRUSHING/ WASHING - FLEXIBLE N/A 03/18/2019    Performed by Lenor Derrick, MD at [facility] OR   ? COLONOSCOPY WITH CONTROL OF BLEEDING N/A 03/18/2019    Performed by Lenor Derrick, MD at [facility] OR   ? COLONOSCOPY WITH SUBMUCOSAL INJECTION N/A 03/18/2019    Performed by Lenor Derrick, MD at [facility] OR   ? PR LAPAROSCOPY SURG RPR INITIAL INGUINAL HERNIA       Family History   Problem Relation Age of Onset   ? DVT Mother    ? Cancer Father    ? DVT Brother    ? Stroke Paternal Grandfather    ? Cancer Paternal Grandmother      Social History     Socioeconomic History   ? Marital status: Married     Spouse name: Not on file   ? Number of children: Not on file   ? Years of education: Not on file   ? Highest  education level: Not on file   Occupational History   ? Not on file   Tobacco Use   ? Smoking status: Former Smoker     Years: 20.00     Quit date: 08/24/1991     Years since quitting: 28.5   ? Smokeless tobacco: Never Used   Substance and Sexual Activity   ? Alcohol use: No     Alcohol/week: 0.0 standard drinks   ? Drug use: No   ? Sexual activity: Not Currently     Partners: Female   Other Topics Concern   ? Financial planner Yes     CommentPersonnel officer, [employer]   ? Blood Transfusions Not Asked   ? Caffeine Concern Not Asked   ? Occupational Exposure Not Asked   ? Hobby Hazards Not Asked   ? Sleep Concern Not Asked   ? Stress Concern Not Asked   ? Weight Concern Not Asked   ? Special Diet Not Asked   ? Back Care Not Asked   ? Exercise Not Asked   ? Bike Helmet Not Asked   ? Seat Belt Not Asked   ? Self-Exams Not Asked   Social History Narrative   ? Not on file                        Review of Systems   Constitutional: Negative for chills, diaphoresis, fatigue and fever.   HENT: Negative for congestion and sore throat.    Respiratory: Negative for cough.    Cardiovascular: Negative for chest pain.   Gastrointestinal: Negative for abdominal pain, nausea and vomiting.   Musculoskeletal: Positive for arthralgias, gait problem and myalgias. Negative for joint swelling and neck pain.   Skin: Negative for rash.   Neurological: Negative for weakness, numbness and headaches.         Objective:         ? carvediloL (COREG) 6.25 mg tablet Take one tablet by mouth twice daily.   ? ezetimibe (ZETIA) 10 mg tablet Take one tablet by mouth daily.   ? furosemide (LASIX) 20 mg tablet Take one tablet by mouth daily as needed.   ? GLUCOSAMINE HCL PO Take 1,000 mg by mouth twice daily.   ? lisinopril (ZESTRIL) 10 mg tablet Take one tablet by mouth daily.   ? meloxicam (MOBIC) 15 mg tablet Take one-half tablet to one tablet by mouth daily as needed.   ? metFORMIN (GLUCOPHAGE) 1,000 mg tablet Take one tablet by mouth twice daily with meals.   ? pregabalin (LYRICA) 200 mg capsule Take one capsule by mouth twice daily.   ? rivaroxaban (XARELTO) 20 mg tablet Take one tablet by mouth daily with dinner.   ? rosuvastatin (CRESTOR) 20 mg tablet Take one tablet by mouth daily.   ? SAW PALMETTO PO Take  by mouth.   ? spironolactone (ALDACTONE) 25 mg tablet Take one tablet by mouth daily. Please call to schedule office visit with Dr Iver Nestle   ? terbinafine HCL (LAMISIL) 250 mg tablet Take one tablet by mouth daily.   ? tiZANidine (ZANAFLEX) 2 mg tablet TAKE 1 TO 2 TABLETS THREE TIMES A DAY AS NEEDED   ? traMADol (ULTRAM) 50 mg tablet Take 50 mg by mouth every 8 hours as needed.     Vitals:    02/26/20 0801   BP: 116/68   BP Source: Arm, Left Upper   Patient Position: Sitting   Pulse: 82   Resp:  18   Temp: 36.8 ?C (98.3 ?F)   TempSrc: Temporal   SpO2: 95%   Weight: 132.2 kg (291 lb 8 oz)   Height: 177.8 cm (70)   PainSc: Two     Body mass index is 41.83 kg/m?Marland Kitchen     Physical Exam  Vitals reviewed.   Constitutional:       Appearance: Normal appearance. He is well-developed.   HENT:      Head: Normocephalic and atraumatic.      Nose: Nose normal.   Eyes:      Conjunctiva/sclera: Conjunctivae normal.   Cardiovascular:      Rate and Rhythm: Normal rate.   Pulmonary:      Effort: Pulmonary effort is normal.   Musculoskeletal:         General: Tenderness present.      Comments: Tender to palpation over adductus magnus, minimal pain with internal rotation of hip, no pain with external rotation, full ROM of knee, stable valgus and varus stressing, - seated straight leg raises, no motion or mass noted with valsalva maneuver.     Skin:     General: Skin is warm.      Capillary Refill: Capillary refill takes less than 2 seconds.      Findings: Rash present.      Comments: Dry, slight inflamed, red rash in inguinal crease on right and left.   Neurological:      Mental Status: He is alert.      Coordination: Coordination normal.   Psychiatric:         Behavior: Behavior normal.         Thought Content: Thought content normal.         Judgment: Judgment normal.              Assessment and Plan:  1. Chronic right hip pain  2. Pain of right lower extremity  - I have reviewed the patient's medical history in detail and updated the computerized patient record.  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  Additionally his lumbar spine may be contributing but based on his exam and description of symptoms I think this is less likely at this time.  - xrays ordered, visualized and reviewed  - I reviewed with the patient their current medications and specifically any new medications prescribed at the time of this visit and we reviewed the expected benefits and potential side effects. All questions are answered to the patient's satisfaction.  - HIP 2-3 VIEWS W PELVIS RT; Future  - meloxicam (MOBIC) 15 mg tablet; Take one-half tablet to one tablet by mouth daily as needed.  Dispense: 90 tablet; Refill: 3    3. Tinea cruris  - I have reviewed the patient's medical history in detail and updated the computerized patient record.  - appears he has a chronic rash and has treated topically.  Will give a short course of PO lamisil to treat.  - I reviewed with the patient their current medications and specifically any new medications prescribed at the time of this visit and we reviewed the expected benefits and potential side effects. All questions are answered to the patient's satisfaction.  - terbinafine HCL (LAMISIL) 250 mg tablet; Take one tablet by mouth daily.  Dispense: 14 tablet; Refill: 0    4. Type 2 diabetes mellitus without complication, without long-term current use of insulin (HCC)  -  I have reviewed the patient's medical history in detail and updated the computerized patient record.  - to be drawn as f/u for previous lab results.    - HEMOGLOBIN A1C; Future

## 2020-02-29 ENCOUNTER — Encounter: Admit: 2020-02-29 | Discharge: 2020-02-29 | Payer: MEDICARE

## 2020-02-29 DIAGNOSIS — M25551 Pain in right hip: Secondary | ICD-10-CM

## 2020-03-01 ENCOUNTER — Encounter: Admit: 2020-03-01 | Discharge: 2020-03-01 | Payer: MEDICARE

## 2020-03-01 ENCOUNTER — Ambulatory Visit: Admit: 2020-03-01 | Discharge: 2020-03-01 | Payer: MEDICARE

## 2020-03-01 DIAGNOSIS — J849 Interstitial pulmonary disease, unspecified: Secondary | ICD-10-CM

## 2020-03-01 DIAGNOSIS — I428 Other cardiomyopathies: Secondary | ICD-10-CM

## 2020-03-01 DIAGNOSIS — R768 Other specified abnormal immunological findings in serum: Secondary | ICD-10-CM

## 2020-03-01 DIAGNOSIS — I351 Nonrheumatic aortic (valve) insufficiency: Secondary | ICD-10-CM

## 2020-03-01 DIAGNOSIS — E669 Obesity, unspecified: Secondary | ICD-10-CM

## 2020-03-01 DIAGNOSIS — K760 Fatty (change of) liver, not elsewhere classified: Secondary | ICD-10-CM

## 2020-03-01 DIAGNOSIS — I2510 Atherosclerotic heart disease of native coronary artery without angina pectoris: Secondary | ICD-10-CM

## 2020-03-01 DIAGNOSIS — I493 Ventricular premature depolarization: Secondary | ICD-10-CM

## 2020-03-01 DIAGNOSIS — I2699 Other pulmonary embolism without acute cor pulmonale: Secondary | ICD-10-CM

## 2020-03-01 DIAGNOSIS — G609 Hereditary and idiopathic neuropathy, unspecified: Secondary | ICD-10-CM

## 2020-03-01 DIAGNOSIS — J984 Other disorders of lung: Secondary | ICD-10-CM

## 2020-03-01 DIAGNOSIS — N529 Male erectile dysfunction, unspecified: Secondary | ICD-10-CM

## 2020-03-01 DIAGNOSIS — M5416 Radiculopathy, lumbar region: Secondary | ICD-10-CM

## 2020-03-01 DIAGNOSIS — I712 Thoracic aortic aneurysm, without rupture: Secondary | ICD-10-CM

## 2020-03-01 DIAGNOSIS — E785 Hyperlipidemia, unspecified: Secondary | ICD-10-CM

## 2020-03-01 DIAGNOSIS — I10 Essential (primary) hypertension: Secondary | ICD-10-CM

## 2020-03-01 DIAGNOSIS — M1990 Unspecified osteoarthritis, unspecified site: Secondary | ICD-10-CM

## 2020-03-01 NOTE — Patient Instructions
Clinic Visit Summary:     Next clinic visit follow up with Dr Hall recommended in 6 months with Pulmonary Function Tests.     Please contact Pulmonary Nurse Coordinator with signs and symptoms of worsening productive cough with thick secretions, blood in sputum, chest tightness/pain, shortness of breath, fever, chills, night sweats, or any questions or concerns.     ILD/Sarcoidosis Clinical Care Coordinators: Jamie Ludwig, RN [phone], Kaelum Kissick, RN [phone], and Heath Farrar, RN [phone]    For refills on medications, please have your pharmacy fax a refill authorization request form to our office at Fax) [phone]. Please allow at least 3 business days for refill requests.   For urgent issues after business hours/weekends/holidays call [phone] and request for the pulmonary fellow to be paged. For scheduling questions/concerns, please call [phone].

## 2020-03-01 NOTE — Progress Notes
Date of Service: 03/01/2020    Subjective:             Victor Graham is a 69 y.o. male.    History of Present Illness  This is a 69 y.o. year old male who presents to the [facility] for further evaluation.    To summarize his history,     Victor Graham was referred to our clinic after he had abnormal imaging findings on a CT scan.  Overall, he notes that he does not have any significant respiratory symptoms.  He denies any notable shortness of breath.  He has a rare cough. He is not on any supplemental oxygen.    He was subsequently referred to the [facility] for further evaluation.    I screened him for autoimmune symptoms that might suggest an autoimmune featured interstitial lung disease.  He reports arthralgias, but denies any synovitis.  He reports significant dry mouth symptoms but denies other sicca symptoms.  He denies GERD symptoms, but reports occasional food sticking to suggest esophageal dysphagia.  He denies raynaud's phenomenon, sclerodactyly, or hyperkeratosis.  He denies any muscle weakness or tenderness. He denies any significant skin rashes or ulcerations.     I also screened him for environmental exposures that might suggest a chronic hypersensitivity pneumonitis.  He denies having birds in the home.  Denies the use of feather pillows or a down comforter.  He does reports that he participates in woodworking as a hobby and has routine exposure polyurethane or isocyanate, but states he wears a respirator when using chemicals or is around dust.  He does report a history of water damage in the home.  He had this remediatied and mold growth is not obvious.  He denies the use of a humidifier or hot tub.  He doesn't live near any industrial or agricultural facilities.  There are no obvious exposures to any other significant respiratory toxins.    Potential drug exposures were also reviewed to exclude a drug induced cause.  He denies the use of chronic nitrofurantoin, methotrexate or amiodarone.  He denies radiation exposure.     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  His prior ANA was markedly elevated.  He denies being diagnosed with an autoimmune disease.         _____________________________________________________________    Victor Graham follows in clinic today.  He denies any significant changes in his breathing.  Denies cough.  Overall doing quite well.  Very mild decline in PFTs today.  Notes he doesn't prefer the wait for clinic and would like to try telehealth with PFTs being performed before the visit.    Medical History:   Diagnosis Date   ? Arthritis    ? Ascending aortic aneurysm (HCC) 01/08/2008   ? Asymptomatic PVCs 03/11/2015    03/2015 per pt, has had them for a long time & does not feel them usually    ? CAD (coronary artery disease)    ? Dyslipidemia    ? Erectile dysfunction    ? Fatty liver    ? HLD (hyperlipidemia) 01/08/2008   ? Hypertension 01/08/2008   ? Idiopathic polyneuropathy    ? Lumbar radiculopathy    ? NICM (nonischemic cardiomyopathy) (HCC)    ? 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 Obesity    ? Positive ANA (antinuclear antibody)  02/03/2016   ? Positive ANA (antinuclear antibody) 02/03/2016   ? Pulmonary embolism [facility]) 2015 -- july 20th    takes xaralto anticoagulant -- treated at [facility]       SOCIAL HISTORY:  Former Smoker. 36 pack years. He denies significant alcohol or illicit substances.    FAMILY HISTORY:  Pulmonary fibrosis or autoimmune diseases does not run in the family       Review of Systems  A full 14 point review of systems was performed and is as above or is unremarkable.      Objective:         ? carvediloL (COREG) 6.25 mg tablet Take one tablet by mouth twice daily.   ? ezetimibe (ZETIA) 10 mg tablet Take one tablet by mouth daily.   ? furosemide (LASIX) 20 mg tablet Take one tablet by mouth daily as needed.   ? GLUCOSAMINE HCL PO Take 1,000 mg by mouth twice daily.   ? lisinopril (ZESTRIL) 10 mg tablet Take one tablet by mouth daily.   ? meloxicam (MOBIC) 15 mg tablet Take one-half tablet to one tablet by mouth daily as needed.   ? metFORMIN (GLUCOPHAGE) 1,000 mg tablet Take one tablet by mouth twice daily with meals.   ? pregabalin (LYRICA) 200 mg capsule Take one capsule by mouth twice daily.   ? rivaroxaban (XARELTO) 20 mg tablet Take one tablet by mouth daily with dinner.   ? rosuvastatin (CRESTOR) 20 mg tablet Take one tablet by mouth daily.   ? SAW PALMETTO PO Take  by mouth.   ? spironolactone (ALDACTONE) 25 mg tablet Take one tablet by mouth daily. Please call to schedule office visit with Dr Iver Nestle   ? terbinafine HCL (LAMISIL) 250 mg tablet Take one tablet by mouth daily.   ? tiZANidine (ZANAFLEX) 2 mg tablet TAKE 1 TO 2 TABLETS THREE TIMES A DAY AS NEEDED   ? traMADol (ULTRAM) 50 mg tablet Take 50 mg by mouth every 8 hours as needed.     Vitals:    03/01/20 1309   Weight: 133.4 kg (294 lb)   Height: 177.8 cm (70)   PainSc: Zero     Body mass index is 42.18 kg/m?Marland Kitchen     Physical Exam  GENERAL:  Alert and Pleasant, No Distress  HEENT: PERRL, EOMI, Grade 2 Mallampati Airway, No Nasal Polyps, No Frontal or Maxillary Sinus Tenderness  NECK: No Cervical or Supraclavicular Adenopathy, No Thyromegaly  CVS:  Regular Rate and Rhythm, No Splitting of P2 Heart Sound, No Murmurs, Rubs, or Gallops  LUNGS: Bibasilar Crackles, No Wheezes  ABDOMEN: Soft, Nontender, No Hepatosplenomegaly  EXTREMITIES: Normal muscle bulk and tone with 5/5 strength, No active Raynaud's, No tenosynovitis, or sclerodactyly.  No edema.  SKIN: No rashes. No ulcers.  NEURO: CN 3-12 intact, Normal Gait      REVIEW OF DATA:  Pulmonary Function Testing    FVC  FEV1  TLC  DLCO  03/01/2020 2.96, 73% 2.50, 81% 4.91, 74% 22.15, 74%  07/07/2019 3.12, 78% 2.64, 86% 5.15, 77% 26.79, 104%  01/06/2019 3.04, 75% 2.53, 83% 5.17, 78% 21.85, 85%    Chest Imaging:  Chest CT:    Subpleural, basal reticulation compatible with interstitial lung   abnormality (ILA). These findings have been described in the asymptomatic   elderly population but may also be seen in early interstitial lung disease   such as usual interstitial pneumonia (UIP). Consider correlation with   pulmonary function tests and patient symptoms that may  be associated with   interstitial lung disease.     Cardiac Data:  Echocardiogram:  Interpretation Summary     Rest Echo:   Overall left ventricular systolic function is normal. EF~ 55%  Abnormal septal motion  Valves appear structurally normal without signficant stenosis or regurgitation  No significant pericardial effusion  Normal ventricular chamber dimensions  Mild left and right atrial enlargement  Normal LV wall thickness  Normal diastolic function  Mild aortic root dilation  Estimated peak systolic PA pressure =  25 mmHg         Pathology Data:  None    Lab Data:  Labs:  Results for TOMMEY, OUIMETTE (MRN [id number]) as of 01/09/2019 10:10   Ref. Range 09/01/2015 07:52 03/30/2016 16:26 06/25/2016 09:34 03/20/2017 15:40 09/11/2017 14:51 01/06/2019 15:47   Alternaria Alternata Unknown      <2.0.Marland KitchenMarland Kitchen   Aspergillus Fumigatus IgG Unknown      16.7   Aureobasidium Pullulans Unknown      3.7   Micropolyspora Faeni Unknown      <2.0.Marland KitchenMarland Kitchen   Penicillium Notatum Unknown      13.9   Phoma Herbarum Latest Units: MCG/ML      2.6   Thermoactomyces Vulgaris Unknown      5.8   Trichoderma Viride Unknown      5.3   DNA Double Strand AB Latest Ref Range: <10 TITER    <10     ANA Screen Latest Ref Range: <80 TITER SEE TITER     SEE TITER   ANA Titer/Pattern Latest Ref Range: <80  > OR = 1280     320 (H)   Anti-Smith Latest Ref Range: <1.0 AI     <0.2    Anti-RNP Latest Ref Range: <1.0 AI     <0.2    Anti-SSA Latest Ref Range: <1.0 AI     <0.2 <0.2   Anti-SSB Latest Ref Range: <1.0 AI     <0.2 <0.2   Rheum Factor Screen Latest Ref Range: <25 IU/mL <20 <20    <10   Centromere Antibody Unknown  <0.2.Marland KitchenMarland Kitchen Myeloperoxidase AB Latest Ref Range: <1.0 AI      <0.2   Serine Protease3 AB Latest Ref Range: <1.0 AI      <0.2   Complemnt C3 Latest Ref Range: 88 - 200 MG/DL    454.0     Complemnt C4 Latest Ref Range: 10 - 49 MG/DL    98.1     Jo 1 Antibody Latest Ref Range: <1.0 AI      <0.2   SCL70 Ab Latest Ref Range: <1.0 AI  <0.2...    <0.2   BETA-2 GLY 1 AB ICG Latest Ref Range: 0 - 20 SGU  1.7       BETA-2 GLY 1 AB IGM Latest Ref Range: 0 - 20 SMU  7.2       CCP IgG Antibody Latest Ref Range: <3.0 U/mL  <0.5    <0.5   Cardiolipin, IgG Latest Ref Range: <20.0 GPL/ML  1.2 <1.6      Cardiolipin, IgM Latest Ref Range: <20.0 MPL/ML  21.8 (H) 12.1      Dilute Russell Viper Venom Latest Ref Range: <1.3 RATIO  1.2       Hexagonal Lupus Anticoagulant Unknown  14 (H) 16 (H)      IgG Latest Ref Range: 762 - 1,488 MG/DL      191   IgM Latest Ref Range: 38 - 328 MG/DL  145   IgA Latest Ref Range: 70 - 390 MG/DL      161                Assessment and Plan:  This is a 69 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    PROBLEM LIST:  1.  Interstitial Lung Disease  2.  Interstitial Lung Abnormalities - UIP/IPF vs non-progressive finding found with age  6.  Positive ANA  4.  History of VTE  5.  Mild restrictive lung disease    PLAN:  1. No indication for treatment at this time. Will continue to monitor and consider starting treatment if progressive decline is noted.   2. Encourage exercise.   3. Follow up in 6 months with PFTs - if declining will repeat CT at that time.    Marlinda Miranda S. Margo Aye, MD  Assistant Professor  Associate Director of the [facility]  Division of Pulmonary & Critical Care Medicine  [address]  [address]  [place], [place] [address]  Phone: [phone]  Fax: [phone]      Total time 30 minutes.  Estimated counseling time 16 minutes.  Counseled the patient regarding important aspects of disease education, including a review of specific imaging features that shape the differential diagnosis, as well as the proposed management plan as above.

## 2020-03-16 ENCOUNTER — Encounter: Admit: 2020-03-16 | Discharge: 2020-03-16 | Payer: MEDICARE

## 2020-03-16 MED ORDER — ROSUVASTATIN 20 MG PO TAB
20 mg | ORAL_TABLET | Freq: Every day | ORAL | 3 refills | 90.00000 days | Status: AC
Start: 2020-03-16 — End: ?

## 2020-03-19 IMAGING — CT Shoulder R(Adult)
3 of 5 series · 14 of 34 positions shown, 16 images · non-contrast
Comparison: none

[Series 6: shoulder r 3.00 br60 s3 sag · sagittal · 0.37mm/px · 6 of 86 slices shown]
[im 15/86  bone]
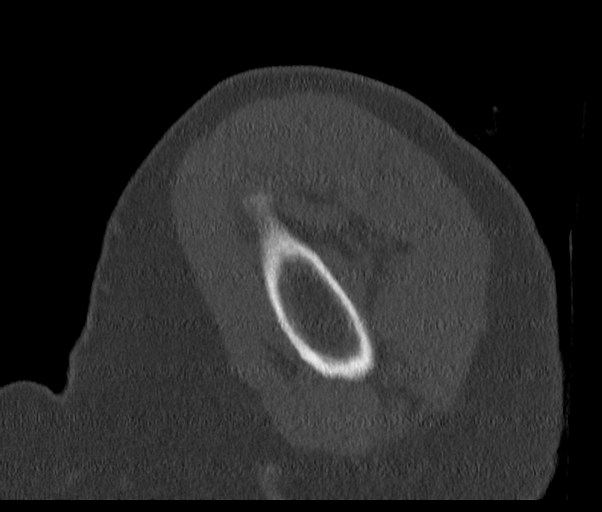
[im 29/86  bone]
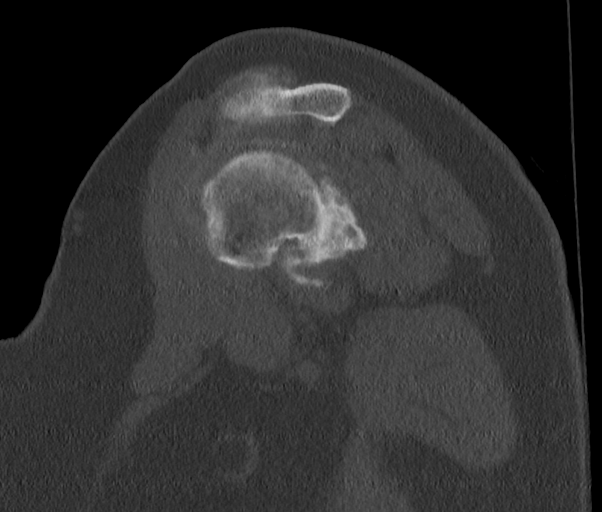
[im 31/86  soft-tissue]
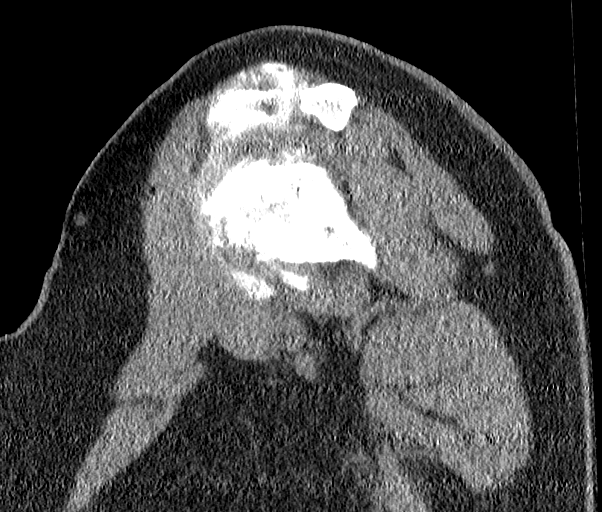
[im 43/86  bone]
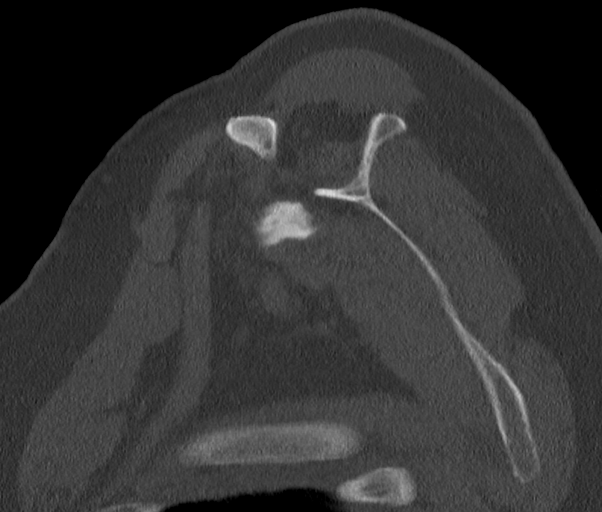
[im 57/86  bone]
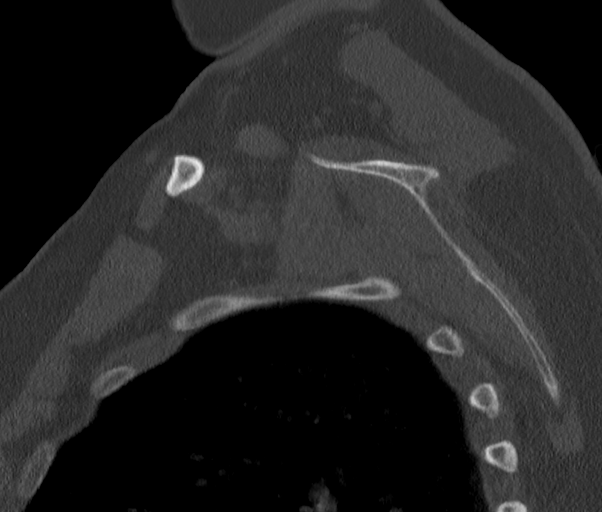
[im 71/86  bone]
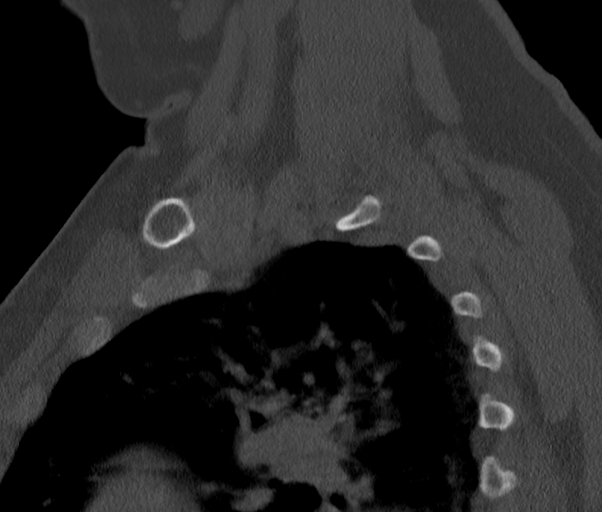

[Series 8: shoulder r 3.00 br60 s3 ax · axial · 0.44mm/px · z∈[+1652,+1783]mm · 5 of 62 slices shown, 7 images]
[im 9/62  soft-tissue]
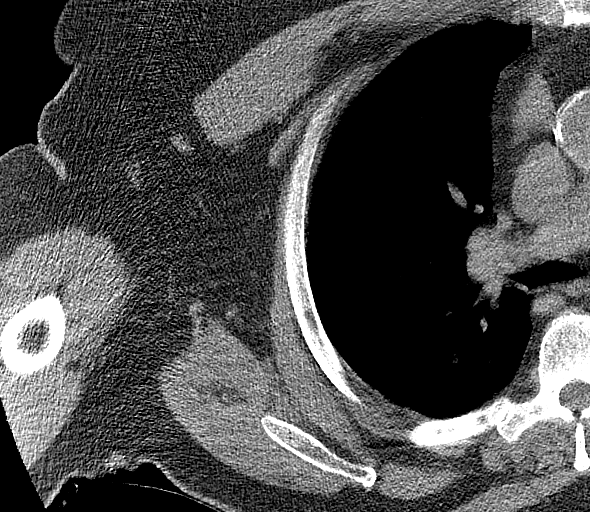
[im 9/62  bone]
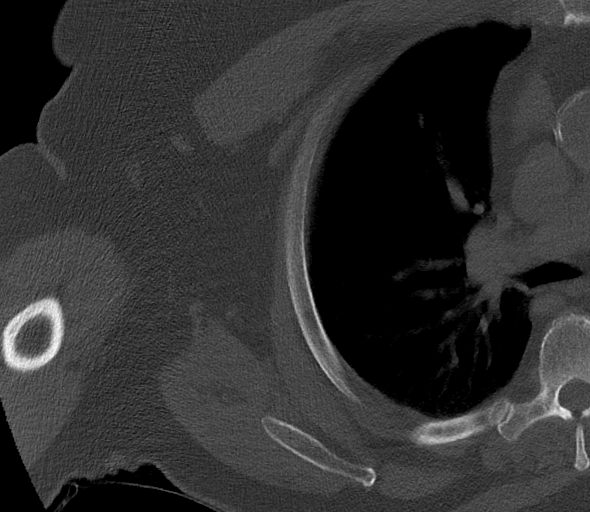
[im 18/62  bone]
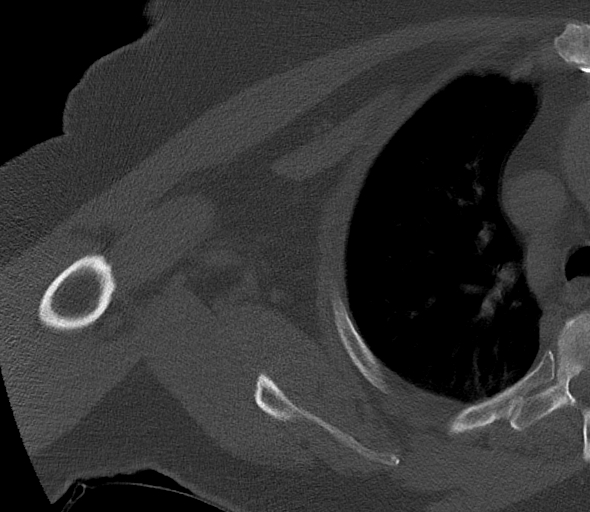
[im 35/62  bone]
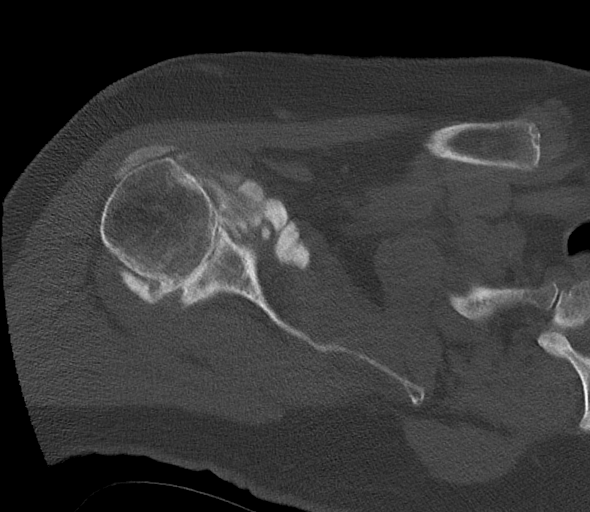
[im 44/62  bone]
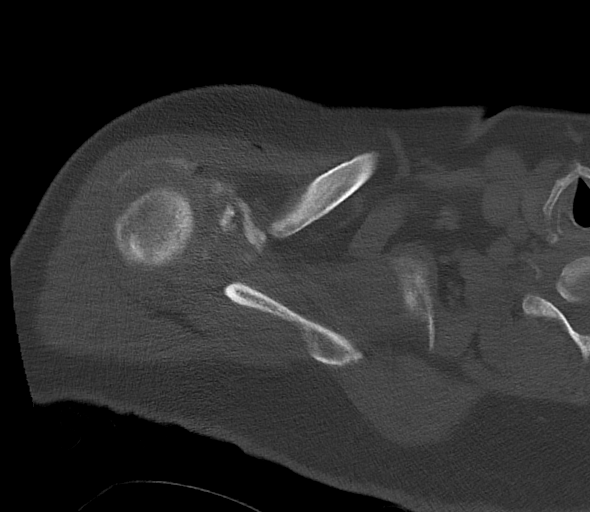
[im 53/62  soft-tissue]
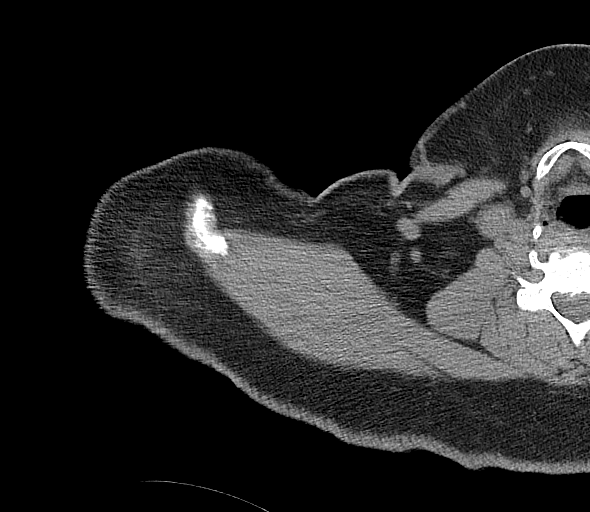
[im 53/62  bone]
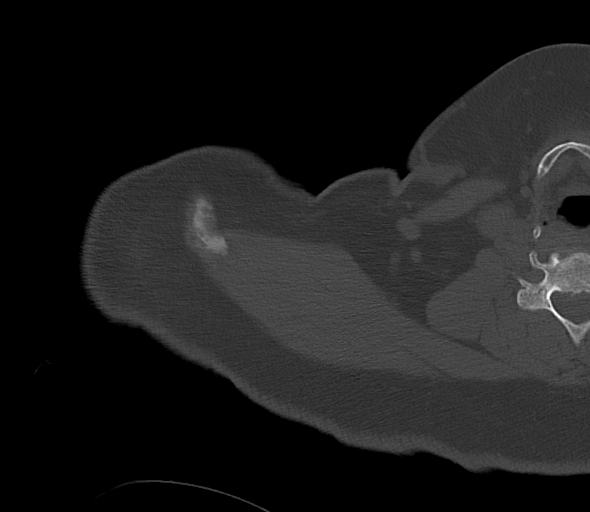

[Series 16: shoulder r 3.00 br60 s3 cor · coronal · 0.37mm/px · 3 of 74 slices shown]
[im 15/74  bone]
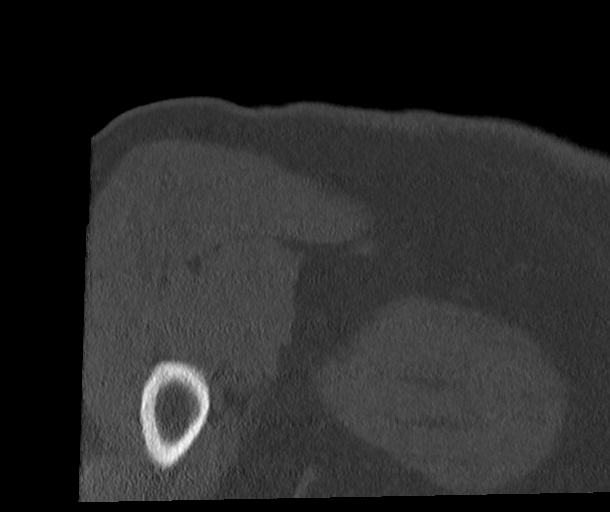
[im 30/74  bone]
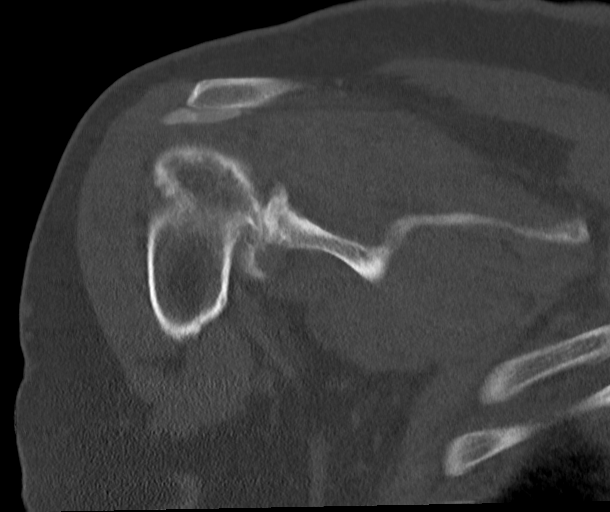
[im 44/74  bone]
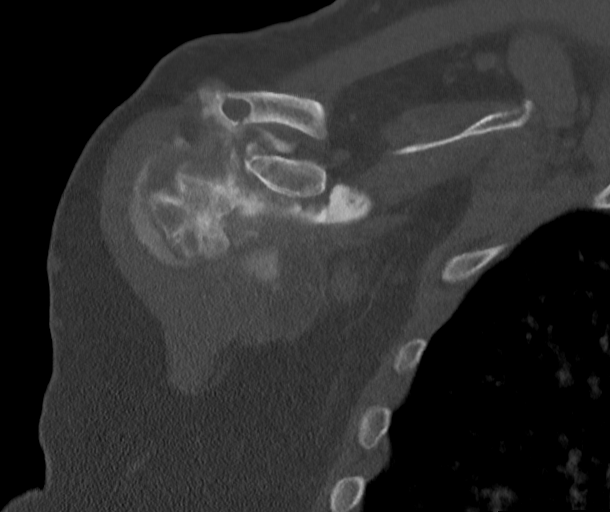

[14 of 34 positions shown; findings below may reference images not displayed]

Primary:     RENO, MEEDO                                                        Date: 11/

EXAM
Right shoulder CT arthrogram.

INDICATION
djd rt shoulder, not able to have mri
CT SHOULDER ARTHROGRAM. [id number]

TECHNIQUE
Arthrography was performed under fluoroscopic guidance with noncontrast CT scan of the right
shoulder then performed. Coronal and sagittal reformations were provided. All CT scans at this
facility use dose modulation, iterative reconstruction, and/or weight based dosing when appropriate
to reduce radiation dose to as low as reasonably achievable.

COMPARISONS
Arthrogram from same date. Right shoulder radiographs February 05, 2018.

FINDINGS
There is adequate contrast opacification of the glenohumeral articulation. Intra-articular
debris/synovitis is present, most pronounced in the axillary pouch with joint fluid also extending
into the superior subscapularis recess. Joint fluid leaks around the rotator cuff into the
subacromial/subdeltoid bursal space. Evidence of subcutaneous gas in the anterior deltoid is
iatrogenic. There is also some subcutaneous fat stranding in this region which is iatrogenic.
The supraspinatus, infraspinatus, and teres minor tendons appear to be intact for the most part
although there likely irregular tearing of the subscapularis tendon allowing for fluid to escape
into the subacromial/subdeltoid bursal space. There is however apparent thickening of the
supraspinatus and infraspinatus tendons suggesting hypertrophic tendinopathy.
The biceps tendon is perched on the lesser tuberosity and appears to be thickened. No frank tear of
the biceps tendon is suggested.
There is severe glenohumeral osteoarthropathy with bone-on-bone articulation, bulky marginal
spurring, and extensive subchondral cystic change. Subchondral sclerosis is also present. There is
also moderate acromioclavicular osteoarthropathy. No fracture. No aggressive osseous lesion. Median
sternotomy wires are incidentally noted. There is some dependent atelectasis in the lungs but the
lungs are otherwise clear.

IMPRESSION
Severe glenohumeral osteoarthropathy. There is irregular tearing of the subscapularis tendon with
evidence of full-thickness perforations noted. There is evidence of biceps tendinopathy with evi
dence of disruption of the transverse humeral ligament allowing for the biceps tendon to be perched
on the lesser tuberosity. Moderate acromioclavicular osteoarthropathy is also noted

Tech Notes:

CT SHOULDER ARTHROGRAM. [id number]

## 2020-03-22 ENCOUNTER — Encounter: Admit: 2020-03-22 | Discharge: 2020-03-22 | Payer: MEDICARE

## 2020-03-22 ENCOUNTER — Ambulatory Visit: Admit: 2020-03-22 | Discharge: 2020-03-22 | Payer: MEDICARE

## 2020-03-22 DIAGNOSIS — I2699 Other pulmonary embolism without acute cor pulmonale: Secondary | ICD-10-CM

## 2020-03-22 DIAGNOSIS — K760 Fatty (change of) liver, not elsewhere classified: Secondary | ICD-10-CM

## 2020-03-22 DIAGNOSIS — E785 Hyperlipidemia, unspecified: Secondary | ICD-10-CM

## 2020-03-22 DIAGNOSIS — M5416 Radiculopathy, lumbar region: Secondary | ICD-10-CM

## 2020-03-22 DIAGNOSIS — E669 Obesity, unspecified: Secondary | ICD-10-CM

## 2020-03-22 DIAGNOSIS — N529 Male erectile dysfunction, unspecified: Secondary | ICD-10-CM

## 2020-03-22 DIAGNOSIS — I493 Ventricular premature depolarization: Secondary | ICD-10-CM

## 2020-03-22 DIAGNOSIS — I428 Other cardiomyopathies: Secondary | ICD-10-CM

## 2020-03-22 DIAGNOSIS — I351 Nonrheumatic aortic (valve) insufficiency: Secondary | ICD-10-CM

## 2020-03-22 DIAGNOSIS — G609 Hereditary and idiopathic neuropathy, unspecified: Secondary | ICD-10-CM

## 2020-03-22 DIAGNOSIS — M1990 Unspecified osteoarthritis, unspecified site: Secondary | ICD-10-CM

## 2020-03-22 DIAGNOSIS — R768 Other specified abnormal immunological findings in serum: Secondary | ICD-10-CM

## 2020-03-22 DIAGNOSIS — I2510 Atherosclerotic heart disease of native coronary artery without angina pectoris: Secondary | ICD-10-CM

## 2020-03-22 DIAGNOSIS — M25551 Pain in right hip: Secondary | ICD-10-CM

## 2020-03-22 DIAGNOSIS — I712 Thoracic aortic aneurysm, without rupture: Secondary | ICD-10-CM

## 2020-03-22 DIAGNOSIS — I10 Essential (primary) hypertension: Secondary | ICD-10-CM

## 2020-03-22 MED ORDER — TIZANIDINE 2 MG PO TAB
2 mg | ORAL_TABLET | ORAL | 11 refills | Status: AC
Start: 2020-03-22 — End: ?

## 2020-03-22 MED ORDER — BUPIVACAINE (PF) 0.25 % (2.5 MG/ML) IJ SOLN
3 mL | Freq: Once | INTRAMUSCULAR | 0 refills | Status: CP | PRN
Start: 2020-03-22 — End: ?
  Administered 2020-03-22: 19:00:00 3 mL via INTRAMUSCULAR

## 2020-03-22 MED ORDER — TRIAMCINOLONE ACETONIDE 40 MG/ML IJ SUSP
40 mg | Freq: Once | INTRAMUSCULAR | 0 refills | Status: CP | PRN
Start: 2020-03-22 — End: ?
  Administered 2020-03-22: 19:00:00 40 mg via INTRAMUSCULAR

## 2020-03-22 NOTE — Procedures
Attending Surgeon: Clovis Cao, MD    Anesthesia: Local    Pre-Procedure Diagnosis:   1. Pain in right hip        Post-Procedure Diagnosis:   1. Pain in right hip        Large Joint Drain/Inject: R greater trochanteric bursa on 03/22/2020 1:00 PM    Consent:   Consent obtained: written  Consent given by: patient  Risks discussed: bleeding and infection  Alternatives discussed: alternative treatment     Universal Protocol:  Relevant documents: relevant documents present and verified  Test results: test results available and properly labeled  Imaging studies: imaging studies available  Required items: required blood products, implants, devices, and special equipment available  Site marked: the operative site was marked  Patient identity confirmed: Patient identify confirmed verbally with patient.        Time out: Immediately prior to procedure a "time out" was called to verify the correct patient, procedure, equipment, support staff and site/side marked as required      Procedures Details:  Procedure Peformed: Injection Only  Indications: pain  Details:Prep: alcohol   25 G needleMedications: 3 mL bupivacaine PF 0.25 %; 40 mg triamcinolone acetonide 40 mg/mL        Estimated blood loss: none or minimal  Specimens: none  Patient tolerated the procedure well with no immediate complications. Pressure was applied, and hemostasis was accomplished.

## 2020-03-22 NOTE — Patient Instructions
It was nice to see you today. Thank you for choosing to visit our clinic.      Your time is important and if you had to wait today, we do apologize. Our goal is to run exactly on time; however, on occasion, we get behind in clinic due to unexpected patient issues. Thank you for your patience.    General Instructions:   How to reach me: Please send a MyChart message to the [facility] or leave a voicemail for my nurse, [place], at [phone].   How to get a medication refill: Please use the MyChart Refill request or contact your pharmacy directly to request medication refills. Please allow 72 business hours for request to be completed.   How to receive your test results: If you have signed up for MyChart, you will receive your test results and messages from me this way. Otherwise, you will get a phone call or letter. If you are expecting results and have not heard from my office within 2 weeks of your testing, please send a MyChart message or call my office.   Scheduling: Our scheduling phone number is [phone].   Appointment Reminders on your cell phone: Make sure we have your cell phone number, and Text [facility] to [phone].   Support for many chronic illnesses is available through [facility]: [facility] or [phone].   For questions on nights, weekends or holidays, call the Operator at [phone], and ask for the doctor on call for Anesthesia Pain Management.      Again, thank you for coming in today.    ________________________________________________________________

## 2020-03-22 NOTE — Progress Notes
[facility] CLINIC NOTE       SUBJECTIVE: Victor Graham presents in follow-up for ongoing care regarding back and hip pain.  His main complaint is pain in the lateral right hip.  Pain is worse when walking, particularly up stairs.  Zanaflex 2 mg up to 3 times per day, Lyrica 200 mg twice per day and tramadol 50 mg as needed are keeping the pain fairly well controlled.  He continues to perform home exercise plan as prescribed by physical therapy.         Review of Systems    Current Outpatient Medications:   ?  carvediloL (COREG) 6.25 mg tablet, Take one tablet by mouth twice daily., Disp: 180 tablet, Rfl: 1  ?  ezetimibe (ZETIA) 10 mg tablet, Take one tablet by mouth daily., Disp: 90 tablet, Rfl: 0  ?  furosemide (LASIX) 20 mg tablet, Take one tablet by mouth daily as needed., Disp: 90 tablet, Rfl: 1  ?  GLUCOSAMINE HCL PO, Take 1,000 mg by mouth twice daily., Disp: , Rfl:   ?  lisinopril (ZESTRIL) 10 mg tablet, Take one tablet by mouth daily., Disp: 90 tablet, Rfl: 2  ?  meloxicam (MOBIC) 15 mg tablet, Take one-half tablet to one tablet by mouth daily as needed., Disp: 90 tablet, Rfl: 3  ?  metFORMIN (GLUCOPHAGE) 1,000 mg tablet, Take one tablet by mouth twice daily with meals., Disp: 180 tablet, Rfl: 3  ?  pregabalin (LYRICA) 200 mg capsule, Take one capsule by mouth twice daily., Disp: 180 capsule, Rfl: 3  ?  rivaroxaban (XARELTO) 20 mg tablet, Take one tablet by mouth daily with dinner., Disp: 90 tablet, Rfl: 3  ?  rosuvastatin (CRESTOR) 20 mg tablet, Take one tablet by mouth daily., Disp: 90 tablet, Rfl: 3  ?  SAW PALMETTO PO, Take  by mouth., Disp: , Rfl:   ?  spironolactone (ALDACTONE) 25 mg tablet, Take one tablet by mouth daily. Please call to schedule office visit with Dr Iver Nestle, Disp: 90 tablet, Rfl: 3  ?  terbinafine HCL (LAMISIL) 250 mg tablet, Take one tablet by mouth daily., Disp: 14 tablet, Rfl: 0  ?  tiZANidine (ZANAFLEX) 2 mg tablet, TAKE 1 TO 2 TABLETS THREE TIMES A DAY AS NEEDED, Disp: 90 tablet, Rfl: 3  ?  traMADol (ULTRAM) 50 mg tablet, Take 50 mg by mouth every 8 hours as needed., Disp: , Rfl:   Allergies   Allergen Reactions   ? Other [Unclassified Drug] BLISTERS     dissolving stiches in mouth after dental surgery caused blisters   ? Lipitor [Atorvastatin] FLUSHING (SKIN)   ? Niaspan [Niacin] FLUSHING (SKIN)   ? Rubber SEE COMMENTS     Neoprene rubber causes contact dermatitis (CAUSED BY THIOUREA IN NEOPRENE)     Physical Exam  Vitals:    03/22/20 1241   BP: (!) 142/84   Pulse: 65   SpO2: 95%   Weight: 133.4 kg (294 lb)   PainSc: Three     Oswestry Total Score:: 46  Pain Score: Three  Body mass index is 42.18 kg/m?Marland Kitchen  General: Alert and oriented, very pleasant male.   HEENT showed extraocular muscles were intact and no other abnormalities.  Unlabored breathing.  Regular rate and rhythm on CV exam.   5/5 strength in bilateral upper and lower extremities.    Sensation is intact to light touch and equal in the upper and lower extremities.  There is right lateral hip bursa tenderness to palpation  Gait  is antalgic       IMPRESSION:  1. Pain in right hip          PLAN: Will perform a right hip bursa injection and continue with home exercise plan as prescribed by physical therapy.  Follow-up in 6 months or sooner if needed.  In the meantime we will continue to prescribe tizanidine 2 mg p.o. 3 times daily as needed

## 2020-04-05 ENCOUNTER — Encounter: Admit: 2020-04-05 | Discharge: 2020-04-05 | Payer: MEDICARE

## 2020-04-11 ENCOUNTER — Encounter: Admit: 2020-04-11 | Discharge: 2020-04-11 | Payer: MEDICARE

## 2020-04-15 ENCOUNTER — Encounter: Admit: 2020-04-15 | Discharge: 2020-04-15 | Payer: MEDICARE

## 2020-04-15 ENCOUNTER — Ambulatory Visit: Admit: 2020-04-15 | Discharge: 2020-04-15 | Payer: MEDICARE

## 2020-04-15 DIAGNOSIS — M5416 Radiculopathy, lumbar region: Secondary | ICD-10-CM

## 2020-04-15 DIAGNOSIS — N529 Male erectile dysfunction, unspecified: Secondary | ICD-10-CM

## 2020-04-15 DIAGNOSIS — G609 Hereditary and idiopathic neuropathy, unspecified: Secondary | ICD-10-CM

## 2020-04-15 DIAGNOSIS — E669 Obesity, unspecified: Secondary | ICD-10-CM

## 2020-04-15 DIAGNOSIS — R768 Other specified abnormal immunological findings in serum: Secondary | ICD-10-CM

## 2020-04-15 DIAGNOSIS — I493 Ventricular premature depolarization: Secondary | ICD-10-CM

## 2020-04-15 DIAGNOSIS — I712 Thoracic aortic aneurysm, without rupture: Secondary | ICD-10-CM

## 2020-04-15 DIAGNOSIS — I10 Essential (primary) hypertension: Secondary | ICD-10-CM

## 2020-04-15 DIAGNOSIS — M79672 Pain in left foot: Secondary | ICD-10-CM

## 2020-04-15 DIAGNOSIS — I428 Other cardiomyopathies: Secondary | ICD-10-CM

## 2020-04-15 DIAGNOSIS — I2510 Atherosclerotic heart disease of native coronary artery without angina pectoris: Secondary | ICD-10-CM

## 2020-04-15 DIAGNOSIS — E785 Hyperlipidemia, unspecified: Secondary | ICD-10-CM

## 2020-04-15 DIAGNOSIS — M1990 Unspecified osteoarthritis, unspecified site: Secondary | ICD-10-CM

## 2020-04-15 DIAGNOSIS — I2699 Other pulmonary embolism without acute cor pulmonale: Secondary | ICD-10-CM

## 2020-04-15 DIAGNOSIS — K760 Fatty (change of) liver, not elsewhere classified: Secondary | ICD-10-CM

## 2020-04-15 DIAGNOSIS — I351 Nonrheumatic aortic (valve) insufficiency: Secondary | ICD-10-CM

## 2020-04-15 NOTE — Progress Notes
Date of Service: 04/15/2020    Victor Graham is a 70 y.o. male.  DOB: 17-Dec-1950  MRN: [id number]     Subjective:             History of Present Illness    Patient is here for evaluation of a new crack on his left foot.  He reports he has noticed near his heel he has a crack in the bottom of his foot.  He denies any falls or traumas.  He does have a history of neuropathy but does not report any pain in this crack except when he pushes on it.  He does his own foot and toenail care.    Medical History:   Diagnosis Date   ? Arthritis    ? Ascending aortic aneurysm (HCC) 01/08/2008   ? Asymptomatic PVCs 03/11/2015    03/2015 per pt, has had them for a long time & does not feel them usually    ? CAD (coronary artery disease)    ? Dyslipidemia    ? Erectile dysfunction    ? Fatty liver    ? HLD (hyperlipidemia) 01/08/2008   ? Hypertension 01/08/2008   ? Idiopathic polyneuropathy    ? Lumbar radiculopathy    ? NICM (nonischemic cardiomyopathy) (HCC)    ? 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 Obesity    ? Positive ANA (antinuclear antibody) 02/03/2016   ? Positive ANA (antinuclear antibody) 02/03/2016   ? Pulmonary embolism [facility]) 2015 -- july 20th    takes xaralto anticoagulant -- treated at [facility]     Surgical History:   Procedure Laterality Date   ? FASCIOTOMY Right 1992   ? KNEE SURGERY Right 02/2007    Torn meniscus removed   ? HX LUMBAR FUSION  02/2008    L4-5   ? AAA REPAIR  06/2008   ? HX JOINT REPLACEMENT Right 06/2010   ? HERNIA REPAIR  01/2013    x2   ? KNEE REPLACEMENT Left 03/2013   ? CYST REMOVAL Left 06/2013    wrist   ? STIMULATOR IMPLANT  12/2014    Spinal stimulator [employer]   ? CYST REMOVAL Right 08/2017    wrist   ? SHOULDER SURGERY  08/2018    reversed arthroplasty   ? COLONOSCOPY DIAGNOSTIC WITH SPECIMEN COLLECTION BY BRUSHING/ WASHING - FLEXIBLE N/A 03/18/2019    Performed by Lenor Derrick, MD at [facility] OR   ? COLONOSCOPY WITH CONTROL OF BLEEDING N/A 03/18/2019    Performed by Lenor Derrick, MD at [facility] OR   ? COLONOSCOPY WITH SUBMUCOSAL INJECTION N/A 03/18/2019    Performed by Lenor Derrick, MD at [facility] OR   ? PR LAPAROSCOPY SURG RPR INITIAL INGUINAL HERNIA       Family History   Problem Relation Age of Onset   ? DVT Mother    ? Cancer Father    ? DVT Brother    ? Stroke Paternal Grandfather    ? Cancer Paternal Grandmother      Social History     Socioeconomic History   ? Marital status: Married     Spouse name: Not on file   ? Number of children: Not on file   ? Years of education: Not on file   ? Highest education level: Not on file   Occupational History   ? Not on file   Tobacco Use   ?  Smoking status: Former Smoker     Years: 20.00     Quit date: 08/24/1991     Years since quitting: 28.6   ? Smokeless tobacco: Never Used   Substance and Sexual Activity   ? Alcohol use: No     Alcohol/week: 0.0 standard drinks   ? Drug use: No   ? Sexual activity: Not Currently     Partners: Female   Other Topics Concern   ? Financial planner Yes     CommentPersonnel officer, [employer]   ? Blood Transfusions Not Asked   ? Caffeine Concern Not Asked   ? Occupational Exposure Not Asked   ? Hobby Hazards Not Asked   ? Sleep Concern Not Asked   ? Stress Concern Not Asked   ? Weight Concern Not Asked   ? Special Diet Not Asked   ? Back Care Not Asked   ? Exercise Not Asked   ? Bike Helmet Not Asked   ? Seat Belt Not Asked   ? Self-Exams Not Asked   Social History Narrative   ? Not on file                        Review of Systems   Constitutional: Negative for chills, diaphoresis, fatigue and fever.   HENT: Negative for congestion and sore throat.    Respiratory: Negative for cough.    Cardiovascular: Negative for chest pain.   Gastrointestinal: Negative for abdominal pain, nausea and vomiting.   Musculoskeletal: Positive for arthralgias. Negative for joint swelling, myalgias and neck pain.   Skin: Positive for wound. Negative for rash.   Neurological: Positive for numbness. Negative for weakness and headaches.         Objective:         ? carvediloL (COREG) 6.25 mg tablet Take one tablet by mouth twice daily.   ? ezetimibe (ZETIA) 10 mg tablet Take one tablet by mouth daily.   ? furosemide (LASIX) 20 mg tablet Take one tablet by mouth daily as needed.   ? GLUCOSAMINE HCL PO Take 1,000 mg by mouth twice daily.   ? lisinopril (ZESTRIL) 10 mg tablet Take one tablet by mouth daily.   ? meloxicam (MOBIC) 15 mg tablet Take one-half tablet to one tablet by mouth daily as needed.   ? metFORMIN (GLUCOPHAGE) 1,000 mg tablet Take one tablet by mouth twice daily with meals.   ? pregabalin (LYRICA) 200 mg capsule Take one capsule by mouth twice daily.   ? rivaroxaban (XARELTO) 20 mg tablet Take one tablet by mouth daily with dinner.   ? rosuvastatin (CRESTOR) 20 mg tablet Take one tablet by mouth daily.   ? SAW PALMETTO PO Take  by mouth.   ? spironolactone (ALDACTONE) 25 mg tablet Take one tablet by mouth daily. Please call to schedule office visit with Dr Iver Nestle   ? tiZANidine (ZANAFLEX) 2 mg tablet Take one tablet by mouth every 8 hours.   ? traMADol (ULTRAM) 50 mg tablet Take 50 mg by mouth every 8 hours as needed.     Vitals:    04/15/20 0852   BP: 124/68   BP Source: Arm, Right Upper   Patient Position: Sitting   Pulse: 82   Temp: 36.8 ?C (98.2 ?F)   TempSrc: Temporal   SpO2: 96%   Weight: 129.3 kg (285 lb)   PainSc: Zero     Body mass index is 40.89 kg/m?Marland Kitchen     Physical  Exam  Vitals reviewed.   Constitutional:       Appearance: Normal appearance. He is well-developed.   HENT:      Head: Normocephalic and atraumatic.      Nose: Nose normal.   Eyes:      Conjunctiva/sclera: Conjunctivae normal.   Cardiovascular:      Rate and Rhythm: Normal rate.   Pulmonary:      Effort: Pulmonary effort is normal.   Musculoskeletal:        Feet:    Skin:     General: Skin is warm.      Capillary Refill: Capillary refill takes less than 2 seconds.      Findings: No rash.   Neurological:      Mental Status: He is alert.      Coordination: Coordination normal.   Psychiatric:         Behavior: Behavior normal.         Thought Content: Thought content normal.         Judgment: Judgment normal.              Assessment and Plan:  1. Left foot pain  - I have reviewed the patient's medical history in detail and updated the computerized patient record.  - This appears to be a very superficial crack in the dry layers of his skin.  I reviewed the importance of good foot hygiene as well as importance in him inspecting his feet each day.    - I reviewed nail care as well  - he is going to improve on moisturizing his skin  - will refer to podiatry for diabetic foot care as well.

## 2020-04-19 NOTE — Progress Notes
EMG report faxed to [facility]. Fax confirmation received.

## 2020-04-19 NOTE — Telephone Encounter
Victor Graham lvm asking that EMG report be faxed or e-mailed to their office.

## 2020-04-24 ENCOUNTER — Encounter: Admit: 2020-04-24 | Discharge: 2020-04-24 | Payer: MEDICARE

## 2020-04-26 ENCOUNTER — Encounter: Admit: 2020-04-26 | Discharge: 2020-04-26 | Payer: MEDICARE

## 2020-04-26 DIAGNOSIS — R0789 Other chest pain: Secondary | ICD-10-CM

## 2020-04-26 DIAGNOSIS — E785 Hyperlipidemia, unspecified: Secondary | ICD-10-CM

## 2020-04-26 MED ORDER — SPIRONOLACTONE 25 MG PO TAB
25 mg | ORAL_TABLET | Freq: Every day | ORAL | 1 refills | 46.00000 days | Status: AC
Start: 2020-04-26 — End: ?

## 2020-04-26 MED ORDER — EZETIMIBE 10 MG PO TAB
10 mg | ORAL_TABLET | Freq: Every day | ORAL | 0 refills | Status: AC
Start: 2020-04-26 — End: ?

## 2020-04-26 NOTE — Telephone Encounter
04/26/2020 8:22 AM    Refilled Spironolactone for 180 days. Pt. Scheduled to see RKB 07/11/2020.

## 2020-05-29 MED ORDER — PREGABALIN 200 MG PO CAP
ORAL_CAPSULE | Freq: Two times a day (BID) | 3 refills
Start: 2020-05-29 — End: ?

## 2020-06-01 ENCOUNTER — Ambulatory Visit: Admit: 2020-06-01 | Discharge: 2020-06-01 | Payer: MEDICARE

## 2020-06-01 ENCOUNTER — Encounter: Admit: 2020-06-01 | Discharge: 2020-06-01 | Payer: MEDICARE

## 2020-06-01 DIAGNOSIS — E119 Type 2 diabetes mellitus without complications: Secondary | ICD-10-CM

## 2020-06-06 ENCOUNTER — Encounter: Admit: 2020-06-06 | Discharge: 2020-06-06 | Payer: MEDICARE

## 2020-06-06 NOTE — Telephone Encounter
-----   Message from Clifton Custard, MD sent at 06/06/2020  6:55 AM CST -----  Please notify patient that his HgbA1c is much improved from 10/21.  We should follow-up in July for his physical.

## 2020-06-06 NOTE — Telephone Encounter
Spoke with patient regarding his lab results. Pt stated his understanding. Pt will follow up as recommended.

## 2020-06-28 ENCOUNTER — Ambulatory Visit: Admit: 2020-06-28 | Discharge: 2020-06-29 | Payer: MEDICARE

## 2020-06-28 ENCOUNTER — Encounter: Admit: 2020-06-28 | Discharge: 2020-06-28 | Payer: MEDICARE

## 2020-06-28 DIAGNOSIS — M5416 Radiculopathy, lumbar region: Secondary | ICD-10-CM

## 2020-06-28 DIAGNOSIS — I2510 Atherosclerotic heart disease of native coronary artery without angina pectoris: Secondary | ICD-10-CM

## 2020-06-28 DIAGNOSIS — I712 Thoracic aortic aneurysm, without rupture: Secondary | ICD-10-CM

## 2020-06-28 DIAGNOSIS — R768 Other specified abnormal immunological findings in serum: Secondary | ICD-10-CM

## 2020-06-28 DIAGNOSIS — E669 Obesity, unspecified: Secondary | ICD-10-CM

## 2020-06-28 DIAGNOSIS — I351 Nonrheumatic aortic (valve) insufficiency: Secondary | ICD-10-CM

## 2020-06-28 DIAGNOSIS — E785 Hyperlipidemia, unspecified: Secondary | ICD-10-CM

## 2020-06-28 DIAGNOSIS — M1990 Unspecified osteoarthritis, unspecified site: Secondary | ICD-10-CM

## 2020-06-28 DIAGNOSIS — N529 Male erectile dysfunction, unspecified: Secondary | ICD-10-CM

## 2020-06-28 DIAGNOSIS — G609 Hereditary and idiopathic neuropathy, unspecified: Secondary | ICD-10-CM

## 2020-06-28 DIAGNOSIS — I493 Ventricular premature depolarization: Secondary | ICD-10-CM

## 2020-06-28 DIAGNOSIS — I2699 Other pulmonary embolism without acute cor pulmonale: Secondary | ICD-10-CM

## 2020-06-28 DIAGNOSIS — I10 Essential (primary) hypertension: Secondary | ICD-10-CM

## 2020-06-28 DIAGNOSIS — K760 Fatty (change of) liver, not elsewhere classified: Secondary | ICD-10-CM

## 2020-06-28 DIAGNOSIS — I428 Other cardiomyopathies: Secondary | ICD-10-CM

## 2020-06-28 NOTE — Progress Notes
Date of Service: 06/28/2020    Subjective:             Victor Graham is a 70 y.o. male.    History of Present Illness  Victor Graham is here for followup of his chronic back pain s/p prior surgery and followed also by Dr Samara Deist in pain clinic and followed for diabetic neuropathy in our clinic. He is using a walker regularly, using cart when he goes to [employer] and  using cane intermittently for short distances at home. His main issue is the back pain when he bends over. He cannot bend over due to worsening back pain. He reports of mild pain in the feet. Mild pain in the right hip. He does not want any back surgery again. He also has the spinal cord stimulator that he states does not work. He is continuing Balance exercises. He reports of few falls due to ankles giving out on him. He stopped wearing the AFO, due to a sore on the leg. He saw dermatalogist for this which is on the lateral aspect of right leg. He reports of better diabetes control with recent hemoglobin A1C 6.9. He lost weight about 20 pounds since the last visit. He is taking Tramadol 1/2 tab bid, pregabalin 200mg  bid. He gets worse pain, if he does not take it. He denies any pain in the feet. He reports of pain being 7-8/10  in the back with bending, sitting down is better. Overall he feels that this is tolerable and he is able to function and accepts that this may not improve further.     Allergies   Allergen Reactions   ? Other [Unclassified Drug] BLISTERS     dissolving stiches in mouth after dental surgery caused blisters   ? Lipitor [Atorvastatin] FLUSHING (SKIN)   ? Niaspan [Niacin] FLUSHING (SKIN)   ? Rubber SEE COMMENTS     Neoprene rubber causes contact dermatitis (CAUSED BY THIOUREA IN NEOPRENE)          Review of Systems   HENT: Positive for tinnitus.    Eyes: Positive for visual disturbance.   Genitourinary: Positive for testicular pain.   Musculoskeletal: Positive for arthralgias, back pain and gait problem.        Walker use Neurological: Positive for numbness.   All other systems reviewed and are negative.        Objective:         ? carvediloL (COREG) 6.25 mg tablet Take one tablet by mouth twice daily.   ? ezetimibe (ZETIA) 10 mg tablet Take one tablet by mouth daily.   ? furosemide (LASIX) 20 mg tablet Take one tablet by mouth daily as needed.   ? GLUCOSAMINE HCL PO Take 1,000 mg by mouth twice daily.   ? lisinopril (ZESTRIL) 10 mg tablet Take one tablet by mouth daily.   ? meloxicam (MOBIC) 15 mg tablet Take one-half tablet to one tablet by mouth daily as needed.   ? metFORMIN (GLUCOPHAGE) 1,000 mg tablet Take one tablet by mouth twice daily with meals.   ? pregabalin (LYRICA) 200 mg capsule TAKE 1 CAPSULE TWICE A DAY   ? rivaroxaban (XARELTO) 20 mg tablet Take one tablet by mouth daily with dinner.   ? rosuvastatin (CRESTOR) 20 mg tablet Take one tablet by mouth daily.   ? SAW PALMETTO PO Take  by mouth.   ? spironolactone (ALDACTONE) 25 mg tablet Take one tablet by mouth daily.   ? tiZANidine (ZANAFLEX) 2 mg tablet  Take one tablet by mouth every 8 hours.   ? traMADol (ULTRAM) 50 mg tablet Take 50 mg by mouth every 8 hours as needed.     Vitals:    06/28/20 1148   Weight: 134.1 kg (295 lb 9.6 oz)   Height: 177.8 cm (5' 10)   PainSc: Two     Body mass index is 42.41 kg/m?Marland Kitchen     Physical Exam       Neurological examination  Mental status patient is awake alert oriented to time place person and situation  Speech is fluent no dysarthria present  Obese  Cranial nerve examination: Intact 2-12  Motor strength examination: Finger abductor's 4+, thumb abductors 4+, rest of the upper extremity strength is diffusely 5, hip flexors 4+, on the right ankle dorsiflexion 4, ankle plantarflexion 4, ankle eversion 4, ankle inversion 5, toe extension 1-2, toe flexion 3 on the left ankle dorsiflexion 5 -, ankle plantarflexion 4+, ankle eversion 5, ankle inversion 5, toe extension 3 -, toe flexion 3  Sensory examination pinprick diminished up to the ankles in the lower extremities and also more decreased in the L5 distribution than the S1, that is in the medial aspect of the feet, normal in the upper extremities, vibratory sensation is 4 seconds on the right and 5 seconds on the left, proprioception is decreased on the right absent on the left  Reflexes absent throughout  Gait is wide-based he is unable to heel walk or toe walk or tandem walk presently using the walker.  He did not wear his AFO today however he reports that he does use the AFO on the right and start using this due to the wound on the right leg  Assessment and Plan:      1.  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 Samara Deist and also prior surgery and did not want to have another surgery, stimulator in place which she reports does not work.  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  Diabetic distal sensorimotor polyneuropathy with mild pain but continuing numbness.  We will continue with pregabalin and tramadol for pain    PLAN:      1) Encouraged use of AFOs'  2) continue pregabalin 200 mg bid  3) tramadol 25mg  bid prn for pain  4) Topical capsaicin, asper, menthol, lidocaine 4% gel, OTC or Voltaren gel  5) control diet, control diabetes  6) Recommend weight loss  7) RTC in 6 month, earlier if symptoms.     Bing Quarry, MD  Professor  Department of Neurology                Total of 40 minutes were spent on the same day of the visit including preparing to see the patient, obtaining and/or reviewing separately obtained history, performing a medically appropriate examination and/or evaluation, counseling and educating the patient/family/caregiver, ordering medications, tests, or procedures, referring and communication with other health care professionals, documenting clinical information in the electronic or other health record, independently interpreting results and communicating results to the patient/family/caregiver, and care coordination

## 2020-07-07 ENCOUNTER — Encounter: Admit: 2020-07-07 | Discharge: 2020-07-07 | Payer: MEDICARE

## 2020-07-11 ENCOUNTER — Encounter: Admit: 2020-07-11 | Discharge: 2020-07-11 | Payer: MEDICARE

## 2020-07-11 ENCOUNTER — Ambulatory Visit: Admit: 2020-07-11 | Discharge: 2020-07-12 | Payer: MEDICARE

## 2020-07-11 DIAGNOSIS — E669 Obesity, unspecified: Secondary | ICD-10-CM

## 2020-07-11 DIAGNOSIS — N529 Male erectile dysfunction, unspecified: Secondary | ICD-10-CM

## 2020-07-11 DIAGNOSIS — I10 Essential (primary) hypertension: Secondary | ICD-10-CM

## 2020-07-11 DIAGNOSIS — I2699 Other pulmonary embolism without acute cor pulmonale: Secondary | ICD-10-CM

## 2020-07-11 DIAGNOSIS — R768 Other specified abnormal immunological findings in serum: Secondary | ICD-10-CM

## 2020-07-11 DIAGNOSIS — M1990 Unspecified osteoarthritis, unspecified site: Secondary | ICD-10-CM

## 2020-07-11 DIAGNOSIS — I428 Other cardiomyopathies: Secondary | ICD-10-CM

## 2020-07-11 DIAGNOSIS — I429 Cardiomyopathy, unspecified: Secondary | ICD-10-CM

## 2020-07-11 DIAGNOSIS — I2510 Atherosclerotic heart disease of native coronary artery without angina pectoris: Secondary | ICD-10-CM

## 2020-07-11 DIAGNOSIS — G609 Hereditary and idiopathic neuropathy, unspecified: Secondary | ICD-10-CM

## 2020-07-11 DIAGNOSIS — E785 Hyperlipidemia, unspecified: Secondary | ICD-10-CM

## 2020-07-11 DIAGNOSIS — M5416 Radiculopathy, lumbar region: Secondary | ICD-10-CM

## 2020-07-11 DIAGNOSIS — I493 Ventricular premature depolarization: Secondary | ICD-10-CM

## 2020-07-11 DIAGNOSIS — I712 Thoracic aortic aneurysm, without rupture: Secondary | ICD-10-CM

## 2020-07-11 DIAGNOSIS — K760 Fatty (change of) liver, not elsewhere classified: Secondary | ICD-10-CM

## 2020-07-11 DIAGNOSIS — I351 Nonrheumatic aortic (valve) insufficiency: Secondary | ICD-10-CM

## 2020-07-11 NOTE — Progress Notes
Date of Service: 07/11/2020    Victor Graham is a 70 y.o. male.       HPI    Victor Graham is a pleasant 71 year old gentleman, coming in for continued care of his essential hypertension, mild to moderate coronary disease, essential hypertension, prior ascending aortic aneurysm repair and dyslipidemia.    He denies any chest pains or undue shortness of breath.  Exertional capacity is significantly limited due to body habitus and back pains.  He has put on a few more pounds.  He was felt not to be a candidate for Ozempic treatment.  ?  The ascending aortic aneurysm repair many years ago.  The CT done in 2020 had shown a possible dissection flap originating from the ascending aortic graft.  Dr. Helen Hashimoto of [facility] personally reviewed the CT and felt that there is nothing to worry about.     He is compliant with his medications.  Cholesterol profile in October 2021 had shown excellent control of LDL.     ?  He had mentioned some chest pains at the last visit that prompted a pharmacological stress test.  The stress test revealed a small area of mixed injury and ischemia in the circumflex territory.  It was a low risk study and conservative treatment was pursued.    ?  He does not smoke.           Vitals:    07/11/20 0805   BP: 120/72   BP Source: Arm, Right Upper   Patient Position: Sitting   Pulse: 68   Weight: 133.4 kg (294 lb)   Height: 177.8 cm (5' 10)   PainSc: Zero     Body mass index is 42.18 kg/m?Marland Kitchen     Past Medical History  Patient Active Problem List    Diagnosis Date Noted   ? History of deep venous thrombosis 03/24/2019   ? 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 Lumbar radiculopathy 09/30/2015   ? 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     09/2015 Mild on prior assessments     ? 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 Started Rapaflo 8mg   01/26/14 - LUTS much improved. Continue Rapaflo 8mg   02/15/15 - stable LUTS      08/16/15- Minimal nocturia and no other symptoms. Interested in trialing discontinuation of his Rapaflo. He will call if his symptoms worsen and we can refill his medications over the phone.     08/14/16 - symptoms stable, PVR 64mL      ? Class 3 severe obesity due to excess calories with serious comorbidity and body mass index (BMI) of 40.0 to 44.9 in adult [facility]) 10/28/2013   ? 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 New finding compared to prior EF 45-50% in '09 (by echo at [facility]), LV gram from '09 reviewed- EF 45%     ? 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 Has been personally reviewed by Dr Helen Hashimoto and no concerns     ? Essential hypertension 01/08/2008     09-17-14: Well controlled. Taking Lisinopril 10 mg daily. Coreg  25 mg 0.5 tablet BID  Well controlled     ? Dyslipidemia 01/08/2008     10-08-14: TC 164, HDL 35, TG 237. On Zocor 20 mg  07-07-14: TC 209, LDL 234, HDL 45, TG 234  11/14 TC 153, LDL 74, HDL 45, TG 172  1/14 good numbers  5/13 TC 152, LDL 76, HDL 39, TG 183  2/11 TC 147, LDL 81, HDL 42, TG 138  Panel checked in 6/10- TC 144, LDL 87, HDL 36, TG 131, on simva 40 qhs, intolerant of niacin     ? CAD (coronary artery disease) 01/08/2008     Mild to moderate, non-obstructive by angio in 10/09 done following an abnormal stress thallium            Review of Systems   Constitutional: Negative.   HENT: Negative.    Eyes: Negative.    Cardiovascular: Positive for chest pain and dyspnea on exertion.   Respiratory: Negative.    Endocrine: Negative.    Hematologic/Lymphatic: Negative.    Skin: Negative.    Gastrointestinal: Positive for heartburn.   Genitourinary: Negative.    Neurological: Negative. Psychiatric/Behavioral: Negative.    Allergic/Immunologic: Negative.        Physical Exam  General Appearance: well for age, appears comfortable  Skin: warm, no ulcers, no xanthomas  Eyes/lids: no conjunctival pallor, no arcus, no xanthelasma  Lips/oral mucosa: no cyanosis  Neck: neck veins flat  Carotids: normal upstroke, no bruit  Chest: normal appearance  Lungs: clear  Cardiac rhythm: regular rhythm & normal rate  Cardiac auscultation: Normal S1 & S2, no S3 or S4, no murmur  Abdomen: soft, non tender, bowel sounds normal, no pulsations/bruits  Extremities: Trace right LE edema, no varicosities, 2+ distal pulses  Neurologic/psych: oriented, no gross motor deficits, normal gait, normal mood & affect     EKG appears unremarkable  Cardiovascular Health Factors  Vitals BP Readings from Last 3 Encounters:   07/11/20 120/72   06/28/20 125/80   04/15/20 124/68     Wt Readings from Last 3 Encounters:   07/11/20 133.4 kg (294 lb)   06/28/20 134.1 kg (295 lb 9.6 oz)   04/15/20 129.3 kg (285 lb)     BMI Readings from Last 3 Encounters:   07/11/20 42.18 kg/m?   06/28/20 42.41 kg/m?   04/15/20 40.89 kg/m?      Smoking Social History     Tobacco Use   Smoking Status Former Smoker   ? Years: 20.00   ? Quit date: 08/24/1991   ? Years since quitting: 28.9   Smokeless Tobacco Never Used      Lipid Profile Cholesterol   Date Value Ref Range Status   01/22/2020 94 <200 MG/DL Final     HDL   Date Value Ref Range Status   01/22/2020 36 (L) >40 MG/DL Final     LDL   Date Value Ref Range Status   01/22/2020 43 <100 mg/dL Final     Triglycerides   Date Value Ref Range Status   01/22/2020 153 (H) <150 MG/DL Final      Blood Sugar Hemoglobin A1C   Date Value Ref Range Status   06/01/2020 6.9 (H) 4.0 - 6.0 % Final     Comment:     The ADA recommends that most patients with type 1 and type 2 diabetes maintain   an A1c level <7%.       Glucose   Date Value Ref Range Status   01/22/2020  132 (H) 70 - 100 MG/DL Final   16/01/9603 540 (H) 70 - 100 MG/DL Final   98/02/9146 829 (H) 70 - 100 MG/DL Final     Glucose Fasting   Date Value Ref Range Status   09/01/2015 117 (H) 70 - 100 MG/DL Final     Glucose, POC   Date Value Ref Range Status   04/18/2017 98 70 - 100 MG/DL Final   56/21/3086 578 (H) 70 - 100 MG/DL Final   46/96/2952 96 70 - 100 MG/DL Final     Glucose POC   Date Value Ref Range Status   03/18/2019 99 65 - 110 mg/dL Final   84/13/2440 102 (A) 65 - 110 mg/dL Final   72/53/6644 034 65 - 110 mg/dL Final          Problems Addressed Today  Encounter Diagnoses   Name Primary?   ? Essential hypertension Yes   ? Status post thoracic aortic aneurysm repair    ? Cardiomyopathy, idiopathic (HCC)    ? Dyslipidemia        Assessment and Plan    In summary, Victor Graham is a 70 year old gentleman with the following cardiac and related issues:  ??  1. History of mild nonischemic cardiomyopathy,?repeat echo has been requested.  ??????  2. Hypertension,? well controlled  3. History of unprovoked PE,?with positive family history DVT?and positive lupus anticoagulant,?to be maintained on long-term Xarelto.   4. Dyslipidemia-? on Crestor with LDL of 43 when checked in October 2021    5. PVCs-?asymptomatic, on?beta-blockers.???  6. History of ascending aortic aneurysm repair?in 2010.???[facility] has looked at the CT scan imaging and there does not appear to be a cause for worry  7. Atypical chest pains,?no recurrence.  Stress test only showed a minor abnormality.  Continue conservative medical treatment.  LDL is very well controlled.   8. DM2, followed by Dr. Abel Graham ??  9. Mildly abnl stress test, on statins  ?      It was a pleasure to see Victor Graham in the clinic today.???  ?           Current Medications (including today's revisions)  ? carvediloL (COREG) 6.25 mg tablet Take one tablet by mouth twice daily.   ? ezetimibe (ZETIA) 10 mg tablet Take one tablet by mouth daily.   ? furosemide (LASIX) 20 mg tablet Take one tablet by mouth daily as needed.   ? GLUCOSAMINE HCL PO Take 1,000 mg by mouth twice daily.   ? lisinopril (ZESTRIL) 10 mg tablet Take one tablet by mouth daily.   ? meloxicam (MOBIC) 15 mg tablet Take one-half tablet to one tablet by mouth daily as needed. (Patient taking differently: Take 7.5-15 mg by mouth twice daily as needed.)   ? metFORMIN (GLUCOPHAGE) 1,000 mg tablet Take one tablet by mouth twice daily with meals.   ? pregabalin (LYRICA) 200 mg capsule TAKE 1 CAPSULE TWICE A DAY   ? rivaroxaban (XARELTO) 20 mg tablet Take one tablet by mouth daily with dinner.   ? rosuvastatin (CRESTOR) 20 mg tablet Take one tablet by mouth daily.   ? SAW PALMETTO PO Take  by mouth.   ? spironolactone (ALDACTONE) 25 mg tablet Take one tablet by mouth daily.   ? tiZANidine (ZANAFLEX) 2 mg tablet Take one tablet by mouth every 8 hours. (Patient taking differently: Take 2 mg by mouth twice daily.)   ? traMADol (ULTRAM) 50 mg tablet Take 50 mg by mouth every 8 hours as  needed.

## 2020-07-11 NOTE — Patient Instructions
Thank you for visiting our office today.   We recommend follow-up with our office in 6 months.   Please call [phone] in 6 months to schedule the office visit Dr. Curly Shores ordered.     Please complete the following orders:     We will schedule your ECHO today.     Take your medications as ordered.   Check your list with what you have on hand at home.   Should you have any additional questions or concerns, please message me through MyChart or call the office.    Cardiovascular Medicine Team   Clinic phone: [phone]

## 2020-07-12 DIAGNOSIS — Z9889 Other specified postprocedural states: Secondary | ICD-10-CM

## 2020-07-12 DIAGNOSIS — Z8679 Personal history of other diseases of the circulatory system: Secondary | ICD-10-CM

## 2020-07-19 ENCOUNTER — Encounter: Admit: 2020-07-19 | Discharge: 2020-07-19 | Payer: MEDICARE

## 2020-07-20 ENCOUNTER — Encounter: Admit: 2020-07-20 | Discharge: 2020-07-20 | Payer: MEDICARE

## 2020-07-20 DIAGNOSIS — E785 Hyperlipidemia, unspecified: Secondary | ICD-10-CM

## 2020-07-20 MED ORDER — EZETIMIBE 10 MG PO TAB
10 mg | ORAL_TABLET | Freq: Every day | ORAL | 0 refills | Status: AC
Start: 2020-07-20 — End: ?

## 2020-07-26 ENCOUNTER — Encounter: Admit: 2020-07-26 | Discharge: 2020-07-26 | Payer: MEDICARE

## 2020-07-26 ENCOUNTER — Ambulatory Visit: Admit: 2020-07-26 | Discharge: 2020-07-26 | Payer: MEDICARE

## 2020-07-26 DIAGNOSIS — Z9889 Other specified postprocedural states: Secondary | ICD-10-CM

## 2020-07-26 MED ORDER — PERFLUTREN LIPID MICROSPHERES 1.1 MG/ML IV SUSP
1-20 mL | Freq: Once | INTRAVENOUS | 0 refills | Status: CP | PRN
Start: 2020-07-26 — End: ?
  Administered 2020-07-26: 14:00:00 2 mL via INTRAVENOUS

## 2020-07-27 ENCOUNTER — Encounter: Admit: 2020-07-27 | Discharge: 2020-07-27 | Payer: MEDICARE

## 2020-08-07 ENCOUNTER — Encounter: Admit: 2020-08-07 | Discharge: 2020-08-07 | Payer: MEDICARE

## 2020-08-08 ENCOUNTER — Encounter: Admit: 2020-08-08 | Discharge: 2020-08-08 | Payer: MEDICARE

## 2020-08-08 MED ORDER — CARVEDILOL 6.25 MG PO TAB
6.25 mg | ORAL_TABLET | Freq: Two times a day (BID) | ORAL | 1 refills | 90.00000 days | Status: AC
Start: 2020-08-08 — End: ?

## 2020-08-23 ENCOUNTER — Ambulatory Visit: Admit: 2020-08-23 | Discharge: 2020-08-23 | Payer: MEDICARE

## 2020-08-23 ENCOUNTER — Encounter: Admit: 2020-08-23 | Discharge: 2020-08-23 | Payer: MEDICARE

## 2020-08-23 DIAGNOSIS — J849 Interstitial pulmonary disease, unspecified: Secondary | ICD-10-CM

## 2020-09-07 ENCOUNTER — Ambulatory Visit: Admit: 2020-09-07 | Discharge: 2020-09-08 | Payer: MEDICARE

## 2020-09-07 ENCOUNTER — Encounter: Admit: 2020-09-07 | Discharge: 2020-09-07 | Payer: MEDICARE

## 2020-09-07 DIAGNOSIS — M1990 Unspecified osteoarthritis, unspecified site: Secondary | ICD-10-CM

## 2020-09-07 DIAGNOSIS — I493 Ventricular premature depolarization: Secondary | ICD-10-CM

## 2020-09-07 DIAGNOSIS — K760 Fatty (change of) liver, not elsewhere classified: Secondary | ICD-10-CM

## 2020-09-07 DIAGNOSIS — R768 Other specified abnormal immunological findings in serum: Secondary | ICD-10-CM

## 2020-09-07 DIAGNOSIS — M5416 Radiculopathy, lumbar region: Secondary | ICD-10-CM

## 2020-09-07 DIAGNOSIS — I712 Thoracic aortic aneurysm, without rupture: Secondary | ICD-10-CM

## 2020-09-07 DIAGNOSIS — I10 Essential (primary) hypertension: Secondary | ICD-10-CM

## 2020-09-07 DIAGNOSIS — I2699 Other pulmonary embolism without acute cor pulmonale: Secondary | ICD-10-CM

## 2020-09-07 DIAGNOSIS — G609 Hereditary and idiopathic neuropathy, unspecified: Secondary | ICD-10-CM

## 2020-09-07 DIAGNOSIS — I351 Nonrheumatic aortic (valve) insufficiency: Secondary | ICD-10-CM

## 2020-09-07 DIAGNOSIS — E785 Hyperlipidemia, unspecified: Secondary | ICD-10-CM

## 2020-09-07 DIAGNOSIS — J849 Interstitial pulmonary disease, unspecified: Secondary | ICD-10-CM

## 2020-09-07 DIAGNOSIS — I2510 Atherosclerotic heart disease of native coronary artery without angina pectoris: Secondary | ICD-10-CM

## 2020-09-07 DIAGNOSIS — N529 Male erectile dysfunction, unspecified: Secondary | ICD-10-CM

## 2020-09-07 DIAGNOSIS — E669 Obesity, unspecified: Secondary | ICD-10-CM

## 2020-09-07 DIAGNOSIS — J984 Other disorders of lung: Secondary | ICD-10-CM

## 2020-09-07 DIAGNOSIS — I428 Other cardiomyopathies: Secondary | ICD-10-CM

## 2020-09-07 NOTE — Patient Instructions
Clinic Visit Summary:     Next clinic visit follow up with Dr Hall recommended in 4 months with Pulmonary Function Tests, at any location.     Please contact Pulmonary Nurse Coordinator with signs and symptoms of worsening productive cough with thick secretions, blood in sputum, chest tightness/pain, shortness of breath, fever, chills, night sweats, or any questions or concerns.     ILD/Sarcoidosis Clinical Care Coordinators: Jamie Ludwig, RN [phone], Solita Macadam, RN [phone], and Heath Farrar, RN [phone]    For refills on medications, please have your pharmacy fax a refill authorization request form to our office at Fax) [phone]. Please allow at least 3 business days for refill requests.   For urgent issues after business hours/weekends/holidays call [phone] and request for the pulmonary fellow to be paged. For scheduling questions/concerns, please call [phone].

## 2020-09-07 NOTE — Progress Notes
Did patient read financial policy, consent to treat, and notice of privacy practices? Yes    Does the patient give verbal consent to each policy? Yes    Does the patient have any vitals to report? Yes    Weight Vitals charted in O2? Yes    Is the patient in pain? 0 = No pain    Screening questions completed? Yes    Is the patient able to acces the Mychart message with the start visit link? Yes    Is patient in "virtual waiting room" No

## 2020-09-07 NOTE — Progress Notes
Date of Service: 09/07/2020    Obtained patient's, or patient proxy's, verbal consent to treat them and their agreement to [facility] financial policy and NPP via this telehealth visit during the [facility]    Subjective:             Victor Graham is a 70 y.o. male.    History of Present Illness  This is a 70 y.o. year old male who presents to the [facility] for further evaluation.    To summarize his history,     Victor Graham was referred to our clinic after he had abnormal imaging findings on a CT scan.  Overall, he notes that he does not have any significant respiratory symptoms.  He denies any notable shortness of breath.  He has a rare cough. He is not on any supplemental oxygen.    He was subsequently referred to the [facility] for further evaluation.    I screened him for autoimmune symptoms that might suggest an autoimmune featured interstitial lung disease.  He reports arthralgias, but denies any synovitis.  He reports significant dry mouth symptoms but denies other sicca symptoms.  He denies GERD symptoms, but reports occasional food sticking to suggest esophageal dysphagia.  He denies raynaud's phenomenon, sclerodactyly, or hyperkeratosis.  He denies any muscle weakness or tenderness. He denies any significant skin rashes or ulcerations.     I also screened him for environmental exposures that might suggest a chronic hypersensitivity pneumonitis.  He denies having birds in the home.  Denies the use of feather pillows or a down comforter.  He does reports that he participates in woodworking as a hobby and has routine exposure polyurethane or isocyanate, but states he wears a respirator when using chemicals or is around dust.  He does report a history of water damage in the home.  He had this remediatied and mold growth is not obvious.  He denies the use of a humidifier or hot tub.  He doesn't live near any industrial or agricultural facilities.  There are no obvious exposures to any other significant respiratory toxins.    Potential drug exposures were also reviewed to exclude a drug induced cause.  He denies the use of chronic nitrofurantoin, methotrexate or amiodarone.  He denies radiation exposure.     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  His prior ANA was markedly elevated.  He denies being diagnosed with an autoimmune disease.         _____________________________________________________________    [name] follows up virtually today.  He denies any major changes in respiratory symptoms.  He does have dyspnea with exertion that is stable.  No other major medical changes since our last visit.      Medical History:   Diagnosis Date   ? Arthritis    ? Ascending aortic aneurysm (HCC) 01/08/2008   ? Asymptomatic PVCs 03/11/2015    03/2015 per pt, has had them for a long time & does not feel them usually    ? CAD (coronary artery disease)    ? Dyslipidemia    ? Erectile dysfunction    ? Fatty liver    ? HLD (hyperlipidemia) 01/08/2008   ? Hypertension 01/08/2008   ? Idiopathic polyneuropathy    ? Lumbar radiculopathy    ? NICM (nonischemic cardiomyopathy) (HCC)    ? 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 Obesity    ? Positive ANA (antinuclear antibody) 02/03/2016   ? Positive ANA (antinuclear antibody) 02/03/2016   ? Pulmonary embolism [facility]) 2015 -- july 20th    takes xaralto anticoagulant -- treated at [place]       SOCIAL HISTORY:  Former Smoker. 36 pack years. He denies significant alcohol or illicit substances.    FAMILY HISTORY:  Pulmonary fibrosis or autoimmune diseases does not run in the family       Review of Systems  A full 14 point review of systems was performed and is as above or is unremarkable.      Objective:         ? carvediloL (COREG) 6.25 mg tablet Take one tablet by mouth twice daily.   ? ezetimibe (ZETIA) 10 mg tablet Take one tablet by mouth daily.   ? furosemide (LASIX) 20 mg tablet Take one tablet by mouth daily as needed.   ? GLUCOSAMINE HCL PO Take 1,000 mg by mouth twice daily.   ? lisinopril (ZESTRIL) 10 mg tablet Take one tablet by mouth daily.   ? meloxicam (MOBIC) 15 mg tablet Take one-half tablet to one tablet by mouth daily as needed. (Patient taking differently: Take 7.5-15 mg by mouth twice daily as needed.)   ? metFORMIN (GLUCOPHAGE) 1,000 mg tablet Take one tablet by mouth twice daily with meals.   ? pregabalin (LYRICA) 200 mg capsule TAKE 1 CAPSULE TWICE A DAY   ? rivaroxaban (XARELTO) 20 mg tablet Take one tablet by mouth daily with dinner.   ? rosuvastatin (CRESTOR) 20 mg tablet Take one tablet by mouth daily.   ? SAW PALMETTO PO Take  by mouth.   ? spironolactone (ALDACTONE) 25 mg tablet Take one tablet by mouth daily.   ? tiZANidine (ZANAFLEX) 2 mg tablet Take one tablet by mouth every 8 hours. (Patient taking differently: Take 2 mg by mouth twice daily.)   ? traMADol (ULTRAM) 50 mg tablet Take 50 mg by mouth every 8 hours as needed.     Vitals:    09/07/20 0920   PainSc: Five   Weight: 131.5 kg (290 lb)   Height: 177.8 cm (5' 10)     Body mass index is 41.61 kg/m?Marland Kitchen     Physical Exam   GENERAL:  Alert and Pleasant, No Distress  HEENT: PERRL, EOMI  NECK: No Cervical or Supraclavicular Adenopathy, No Thyromegaly  CVS:  Regular Rate and Rhythm  LUNGS: CTAB, No Wheezes  ABDOMEN: Soft, Nontender, No Hepatosplenomegaly  EXTREMITIES: No edema.  SKIN: No rashes. No ulcers.  NEURO: Grossly normal        REVIEW OF DATA:  Pulmonary Function Testing    FVC  FEV1  TLC  DLCO  08/23/2020 2.66, 67% 2.20, 72% 4.83, 73% 23.30, 92%  03/01/2020 2.96, 73% 2.50, 81% 4.91, 74% 22.15, 74%  07/07/2019 3.12, 78% 2.64, 86% 5.15, 77% 26.79, 104%  01/06/2019 3.04, 75% 2.53, 83% 5.17, 78% 21.85, 85%    Chest Imaging:  Chest CT:    Subpleural, basal reticulation compatible with interstitial lung   abnormality (ILA). These findings have been described in the asymptomatic   elderly population but may also be seen in early interstitial lung disease   such as usual interstitial pneumonia (UIP). Consider correlation with   pulmonary function tests and patient symptoms that may be associated with   interstitial lung disease.     Cardiac Data:  Echocardiogram:  Interpretation Summary  Rest Echo:   Overall left ventricular systolic function is normal. EF~ 55%  Abnormal septal motion  Valves appear structurally normal without signficant stenosis or regurgitation  No significant pericardial effusion  Normal ventricular chamber dimensions  Mild left and right atrial enlargement  Normal LV wall thickness  Normal diastolic function  Mild aortic root dilation  Estimated peak systolic PA pressure =  25 mmHg         Pathology Data:  None    Lab Data:  Labs:  Results for KIN, TOTTY (MRN [id number]) as of 01/09/2019 10:10   Ref. Range 09/01/2015 07:52 03/30/2016 16:26 06/25/2016 09:34 03/20/2017 15:40 09/11/2017 14:51 01/06/2019 15:47   Alternaria Alternata Unknown      <2.0.Marland KitchenMarland Kitchen   Aspergillus Fumigatus IgG Unknown      16.7   Aureobasidium Pullulans Unknown      3.7   Micropolyspora Faeni Unknown      <2.0.Marland KitchenMarland Kitchen   Penicillium Notatum Unknown      13.9   Phoma Herbarum Latest Units: MCG/ML      2.6   Thermoactomyces Vulgaris Unknown      5.8   Trichoderma Viride Unknown      5.3   DNA Double Strand AB Latest Ref Range: <10 TITER    <10     ANA Screen Latest Ref Range: <80 TITER SEE TITER     SEE TITER   ANA Titer/Pattern Latest Ref Range: <80  > OR = 1280     320 (H)   Anti-Smith Latest Ref Range: <1.0 AI     <0.2    Anti-RNP Latest Ref Range: <1.0 AI     <0.2    Anti-SSA Latest Ref Range: <1.0 AI     <0.2 <0.2   Anti-SSB Latest Ref Range: <1.0 AI     <0.2 <0.2   Rheum Factor Screen Latest Ref Range: <25 IU/mL <20 <20    <10   Centromere Antibody Unknown  <0.2.Marland KitchenMarland Kitchen       Myeloperoxidase AB Latest Ref Range: <1.0 AI      <0.2   Serine Protease3 AB Latest Ref Range: <1.0 AI      <0.2 Complemnt C3 Latest Ref Range: 88 - 200 MG/DL    454.0     Complemnt C4 Latest Ref Range: 10 - 49 MG/DL    98.1     Jo 1 Antibody Latest Ref Range: <1.0 AI      <0.2   SCL70 Ab Latest Ref Range: <1.0 AI  <0.2...    <0.2   BETA-2 GLY 1 AB ICG Latest Ref Range: 0 - 20 SGU  1.7       BETA-2 GLY 1 AB IGM Latest Ref Range: 0 - 20 SMU  7.2       CCP IgG Antibody Latest Ref Range: <3.0 U/mL  <0.5    <0.5   Cardiolipin, IgG Latest Ref Range: <20.0 GPL/ML  1.2 <1.6      Cardiolipin, IgM Latest Ref Range: <20.0 MPL/ML  21.8 (H) 12.1      Dilute Russell Viper Venom Latest Ref Range: <1.3 RATIO  1.2       Hexagonal Lupus Anticoagulant Unknown  14 (H) 16 (H)      IgG Latest Ref Range: 762 - 1,488 MG/DL      191   IgM Latest Ref Range: 38 - 328 MG/DL      478   IgA Latest Ref Range: 70 - 390 MG/DL  243                Assessment and Plan:  This is a 70 y.o. 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    Mr. Finigan has had decline in his PFTs. We will repeat a CT chest to look for progression of interstitial changes.      PROBLEM LIST:  1.  Interstitial Lung Disease  2.  Interstitial Lung Abnormalities - UIP/IPF vs non-progressive finding found with age  63.  Positive ANA  4.  History of VTE  5.  Mild restrictive lung disease    PLAN:  1. CT scan next available  2.  If UIP pattern, will plan to start Ofev or Esbriet  3.  F/u in 4 months with PFTs    Oryan Winterton S. Margo Aye, MD  Assistant Professor  Associate Director of the [facility]  Division of Pulmonary & Critical Care Medicine  [address]  [address]  [place], [place] [address]  Phone: [phone]  Fax: [phone]    Total time 30 minutes.  Estimated counseling time 15 minutes.  Counseled the patient regarding important aspects of disease education, including a review of specific imaging features that shape the differential diagnosis, as well as the proposed management plan as above.

## 2020-09-09 ENCOUNTER — Encounter: Admit: 2020-09-09 | Discharge: 2020-09-09 | Payer: MEDICARE

## 2020-09-13 ENCOUNTER — Encounter: Admit: 2020-09-13 | Discharge: 2020-09-13 | Payer: MEDICARE

## 2020-09-21 ENCOUNTER — Ambulatory Visit: Admit: 2020-09-21 | Discharge: 2020-09-21 | Payer: MEDICARE

## 2020-09-21 ENCOUNTER — Encounter: Admit: 2020-09-21 | Discharge: 2020-09-21 | Payer: MEDICARE

## 2020-09-21 DIAGNOSIS — J849 Interstitial pulmonary disease, unspecified: Secondary | ICD-10-CM

## 2020-10-23 ENCOUNTER — Encounter: Admit: 2020-10-23 | Discharge: 2020-10-23 | Payer: MEDICARE

## 2020-10-24 ENCOUNTER — Encounter: Admit: 2020-10-24 | Discharge: 2020-10-24 | Payer: MEDICARE

## 2020-10-24 ENCOUNTER — Ambulatory Visit: Admit: 2020-10-24 | Discharge: 2020-10-24 | Payer: MEDICARE

## 2020-10-24 DIAGNOSIS — E785 Hyperlipidemia, unspecified: Secondary | ICD-10-CM

## 2020-10-24 DIAGNOSIS — E1142 Type 2 diabetes mellitus with diabetic polyneuropathy: Secondary | ICD-10-CM

## 2020-10-24 DIAGNOSIS — Z0000 Encounter for general adult medical examination without abnormal findings: Secondary | ICD-10-CM

## 2020-10-24 DIAGNOSIS — I10 Essential (primary) hypertension: Secondary | ICD-10-CM

## 2020-10-24 DIAGNOSIS — Z136 Encounter for screening for cardiovascular disorders: Secondary | ICD-10-CM

## 2020-10-24 DIAGNOSIS — I429 Cardiomyopathy, unspecified: Secondary | ICD-10-CM

## 2020-10-24 DIAGNOSIS — L300 Nummular dermatitis: Secondary | ICD-10-CM

## 2020-10-24 DIAGNOSIS — Z9889 Other specified postprocedural states: Secondary | ICD-10-CM

## 2020-10-24 DIAGNOSIS — R600 Localized edema: Secondary | ICD-10-CM

## 2020-10-24 DIAGNOSIS — M5416 Radiculopathy, lumbar region: Secondary | ICD-10-CM

## 2020-10-24 DIAGNOSIS — R0789 Other chest pain: Secondary | ICD-10-CM

## 2020-10-24 DIAGNOSIS — Z86718 Personal history of other venous thrombosis and embolism: Secondary | ICD-10-CM

## 2020-10-24 DIAGNOSIS — N401 Enlarged prostate with lower urinary tract symptoms: Secondary | ICD-10-CM

## 2020-10-24 LAB — CBC AND DIFF
ABSOLUTE BASO COUNT: 0 K/UL (ref 0–0.20)
ABSOLUTE EOS COUNT: 0.1 K/UL (ref 0–0.45)
ABSOLUTE LYMPH COUNT: 1.9 K/UL (ref 1.0–4.8)
ABSOLUTE MONO COUNT: 0.6 K/UL (ref 0–0.80)
ABSOLUTE NEUTROPHIL: 3.3 K/UL (ref 1.8–7.0)
BASOPHILS %: 1 % (ref 0–2)
HEMATOCRIT: 42 % (ref 40–50)
LYMPHOCYTES %: 32 % (ref 24–44)
MCH: 29 pg (ref 26–34)
MCHC: 33 g/dL — ABNORMAL HIGH (ref 32.0–36.0)
MPV: 9.6 FL — ABNORMAL HIGH (ref 7–11)
NEUTROPHILS %: 54 % (ref 41–77)
PLATELET COUNT: 143 K/UL — ABNORMAL LOW (ref 150–400)
RDW: 15 % (ref 11–15)

## 2020-10-24 LAB — LIPID PROFILE
CHOLESTEROL: 93 mg/dL (ref <200)
HDL: 34 mg/dL — ABNORMAL LOW (ref >40)
LDL: 43 mg/dL (ref <100)
NON HDL CHOLESTEROL: 59 mg/dL (ref 3.5–5.1)
TRIGLYCERIDES: 198 mg/dL — ABNORMAL HIGH (ref <150)
VLDL: 40 mg/dL (ref 137–147)

## 2020-10-24 LAB — COMPREHENSIVE METABOLIC PANEL
ANION GAP: 11 s (ref 3–12)
BLD UREA NITROGEN: 26 mg/dL — ABNORMAL HIGH (ref 7–25)
CALCIUM: 9.3 mg/dL (ref 8.5–10.6)
CHLORIDE: 102 MMOL/L (ref 98–110)
EGFR: >60 mL/min (ref >60)
GLUCOSE,PANEL: 123 mg/dL — ABNORMAL HIGH (ref 70–100)

## 2020-10-24 LAB — PROSTATIC SPECIFIC ANTIGEN-PSA: PROSTATIC SPEC AG: 1.7 ng/mL (ref <6.01)

## 2020-10-24 MED ORDER — FUROSEMIDE 20 MG PO TAB
20 mg | ORAL_TABLET | Freq: Every day | ORAL | 0 refills | 90.00000 days | Status: AC | PRN
Start: 2020-10-24 — End: ?

## 2020-10-24 MED ORDER — SPIRONOLACTONE 25 MG PO TAB
25 mg | ORAL_TABLET | Freq: Every day | ORAL | 0 refills | 90.00000 days | Status: AC
Start: 2020-10-24 — End: ?

## 2020-10-24 MED ORDER — LISINOPRIL 10 MG PO TAB
10 mg | ORAL_TABLET | Freq: Every day | ORAL | 0 refills | Status: AC
Start: 2020-10-24 — End: ?

## 2020-10-24 NOTE — Progress Notes
Date of Service: 10/24/2020    Victor Graham is a 70 y.o. male.  DOB: 1950/09/13  MRN: [id number]     Subjective:             History of Present Illness  Patient is here for his Health Maintenance Exam.    Diet adherence:most of the time   Medication adherence:most of the time     Diabetes Management:  The patient has not had hypoglycemic reactions  Lab Results   Component Value Date/Time    HGBA1C 6.9 (H) 06/01/2020 02:06 PM    HGBA1C 7.4 (H) 01/22/2020 11:00 AM    HGBA1C 7.5 (H) 10/20/2019 09:03 AM    HGBPOC 12.9 (L) 07/07/2008 11:49 AM    CHOL 93 10/24/2020 10:42 AM    TRIG 198 (H) 10/24/2020 10:42 AM    HDL 34 (L) 10/24/2020 10:42 AM    LDL 43 10/24/2020 10:42 AM    VLDL 40 10/24/2020 10:42 AM    NONHDLCHOL 59 10/24/2020 10:42 AM    CR 0.80 10/24/2020 10:42 AM    MCALBR <7.0 10/20/2019 09:14 AM    ALT 54 10/24/2020 10:42 AM    CK 255 (H) 01/06/2019 03:47 PM    VITD25 46.5 10/20/2019 09:03 AM        Microalbumin tested in last 12 months?  Yes  Eye exam within the last 12 months? Yes  Comprehensive Foot exam within the last 12 months? Yes  Pneumonia shot current? Yes  The patient is taking an ACE inhibitor or an ARB:Yes   The patient is taking a statin:Yes    Hypertension Management:    Outside blood pressures being performed: Yes  BP Readings from Last 3 Encounters:   10/24/20 102/60   07/26/20 118/78   07/11/20 120/72     He denies significant light-headedness.    Hyperlipidemia Management  Side effects to medications? No    Patient has a known history of CAD and is followed closely by Cardiology.  He also has a remote history (2010) of ascending/thoracic aorta repair.      Medical History:   Diagnosis Date   ? Arthritis    ? Ascending aortic aneurysm (HCC) 01/08/2008   ? Asymptomatic PVCs 03/11/2015    03/2015 per pt, has had them for a long time & does not feel them usually    ? CAD (coronary artery disease)    ? Dyslipidemia    ? Erectile dysfunction    ? Fatty liver    ? HLD (hyperlipidemia) 01/08/2008 ? Hypertension 01/08/2008   ? Idiopathic polyneuropathy    ? Lumbar radiculopathy    ? NICM (nonischemic cardiomyopathy) (HCC)    ? 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 Obesity    ? Positive ANA (antinuclear antibody) 02/03/2016   ? Positive ANA (antinuclear antibody) 02/03/2016   ? Pulmonary embolism [facility]) 2015 -- july 20th    takes xaralto anticoagulant -- treated at [facility]     Surgical History:   Procedure Laterality Date   ? FASCIOTOMY Right 1992   ? KNEE SURGERY Right 02/2007    Torn meniscus removed   ? HX LUMBAR FUSION  02/2008    L4-5   ? AAA REPAIR  06/2008   ? HX JOINT REPLACEMENT Right 06/2010   ? HERNIA REPAIR  01/2013    x2   ? KNEE REPLACEMENT Left 03/2013   ? CYST REMOVAL Left 06/2013  wrist   ? STIMULATOR IMPLANT  12/2014    Spinal stimulator [employer]   ? CYST REMOVAL Right 08/2017    wrist   ? SHOULDER SURGERY  08/2018    reversed arthroplasty   ? COLONOSCOPY DIAGNOSTIC WITH SPECIMEN COLLECTION BY BRUSHING/ WASHING - FLEXIBLE N/A 03/18/2019    Performed by Lenor Derrick, MD at [facility] OR   ? COLONOSCOPY WITH CONTROL OF BLEEDING N/A 03/18/2019    Performed by Lenor Derrick, MD at [facility] OR   ? COLONOSCOPY WITH SUBMUCOSAL INJECTION N/A 03/18/2019    Performed by Lenor Derrick[facility] at [facility] OR   ? PR LAPAROSCOPY SURG RPR INITIAL INGUINAL HERNIA       Family History   Problem Relation Age of Onset   ? DVT Mother    ? Cancer Father    ? DVT Brother    ? Stroke Paternal Grandfather    ? Cancer Paternal Grandmother      Social History     Socioeconomic History   ? Marital status: Married   Tobacco Use   ? Smoking status: Former Smoker     Years: 20.00     Quit date: 08/24/1991     Years since quitting: 29.1   ? Smokeless tobacco: Never Used   Substance and Sexual Activity   ? Alcohol use: No     Alcohol/week: 0.0 standard drinks   ? Drug use: No   ? Sexual activity: Not Currently     Partners: Female   Other Topics Concern   ? Financial planner Yes     CommentPersonnel officer, [employer]        Review of Systems   Constitutional: Positive for fatigue. Negative for chills and fever.   HENT: Negative.    Eyes: Negative.    Respiratory: Negative for cough and shortness of breath.    Cardiovascular: Positive for leg swelling. Negative for chest pain.   Gastrointestinal: Negative.    Endocrine: Negative.    Musculoskeletal: Positive for arthralgias, back pain and gait problem.   Skin: Positive for color change and wound.   Allergic/Immunologic: Negative.    Hematological: Negative.          Objective:         ? carvediloL (COREG) 6.25 mg tablet Take one tablet by mouth twice daily.   ? ezetimibe (ZETIA) 10 mg tablet Take one tablet by mouth daily.   ? furosemide (LASIX) 20 mg tablet Take one tablet by mouth daily as needed.   ? GLUCOSAMINE HCL PO Take 1,000 mg by mouth twice daily.   ? lisinopril (ZESTRIL) 10 mg tablet Take one tablet by mouth daily.   ? meloxicam (MOBIC) 15 mg tablet Take one-half tablet to one tablet by mouth daily as needed. (Patient taking differently: Take 7.5-15 mg by mouth twice daily as needed.)   ? metFORMIN (GLUCOPHAGE) 1,000 mg tablet Take one tablet by mouth twice daily with meals.   ? pregabalin (LYRICA) 200 mg capsule TAKE 1 CAPSULE TWICE A DAY   ? rivaroxaban (XARELTO) 20 mg tablet Take one tablet by mouth daily with dinner.   ? rosuvastatin (CRESTOR) 20 mg tablet Take one tablet by mouth daily.   ? SAW PALMETTO PO Take  by mouth.   ? spironolactone (ALDACTONE) 25 mg tablet Take one tablet by mouth daily.   ? tiZANidine (ZANAFLEX) 2 mg tablet Take one tablet by mouth every 8 hours. (Patient taking differently: Take 2 mg by  mouth twice daily.)   ? traMADol (ULTRAM) 50 mg tablet Take 50 mg by mouth every 8 hours as needed.     Vitals:    10/24/20 0925   BP: 102/60   BP Source: Arm, Right Upper   Pulse: 84   Temp: 36.8 ?C (98.2 ?F)   SpO2: 95%   TempSrc: Temporal   PainSc: Four   Weight: 131.6 kg (290 lb 3.2 oz)   Height: 177.8 cm (5' 10) Body mass index is 41.64 kg/m?Marland Kitchen     Physical Exam  Vitals reviewed.   Constitutional:       Appearance: Normal appearance. He is well-developed.   HENT:      Head: Normocephalic and atraumatic.      Right Ear: Tympanic membrane and ear canal normal.      Left Ear: Tympanic membrane and ear canal normal.      Nose: Nose normal. No congestion.      Mouth/Throat:      Mouth: Mucous membranes are moist.      Pharynx: Oropharynx is clear.   Eyes:      Conjunctiva/sclera: Conjunctivae normal.   Cardiovascular:      Rate and Rhythm: Normal rate and regular rhythm.      Pulses: Normal pulses.      Heart sounds: No murmur heard.  Pulmonary:      Effort: Pulmonary effort is normal.      Breath sounds: No wheezing.   Musculoskeletal:      Cervical back: Neck supple.      Right lower leg: Edema present.      Left lower leg: Edema present.   Lymphadenopathy:      Cervical: No cervical adenopathy.   Skin:     General: Skin is warm.      Capillary Refill: Capillary refill takes less than 2 seconds.      Findings: No rash.      Comments: Red tone of distal lower extremities.  No hair growth.  + peripheral edema.  No weeping noted.  On lateral right leg is well healed incision from fasciotomy done years ago    On posterior right leg is a small, circular, dry, rough lesion.  No drainage or discharge.   Neurological:      Mental Status: He is alert.      Coordination: Coordination normal.   Psychiatric:         Behavior: Behavior normal.         Thought Content: Thought content normal.         Judgment: Judgment normal.              Assessment and Plan:  Health Maintenance Exam  - fasting labs ordered  - counseled the patient on importance of maintaining a healthy diet and regular exercise  - reviewed immunizations.      - CBC AND DIFF; Future  - COMPREHENSIVE METABOLIC PANEL; Future  - HEMOGLOBIN A1C; Future  - LIPID PROFILE; Future  - PROSTATIC SPECIFIC ANTIGEN-PSA; Future    2. Encounter for abdominal aortic aneurysm (AAA) screening  3. Status post thoracic aortic aneurysm repair    - I have reviewed the patient's medical history in detail and updated the computerized patient record.    - [place] AAA SCREENING; Future      4. Type 2 diabetes mellitus with diabetic polyneuropathy, with long-term current use of insulin (HCC)  Diabetes Mellitus    Plan:   Discussed general issues about diabetes pathophysiology  and management.  Discussed exercise management and diet with emphasis on vegetables, fruit and lean meat.  Discussed foot care.  Reminded to get retinal exam annually and dental appointment every 6 months.  Treatment goals: A1C < or = 7.0       BP <140/90  Are barriers to achieving goals present? No  Medication education provided. Patient voiced understanding? Yes  Patient able to self-manage and ready to comply? Yes  Educational resources identified? Verbal Counseling    - HEMOGLOBIN A1C; Future    5. Hyperlipidemia, unspecified hyperlipidemia type  Hyperlipidemia    Plan:  Discussed labs and reviewed goals for LDL, HDL, triglycerides.   Discussed exercise management and diet with emphasis on vegetables, fruit and lean meat.  Are barriers to achieving goals present? No  Medication education provided. Patient voiced understanding? Yes  Patient able to self-manage and ready to comply?Yes  Educational resources identified? Verbal Counseling     - LIPID PROFILE; Future    6. Essential hypertension  Hypertension    Plan:   Discussed hypertension and reviewed goals.  Are barriers to achieving goals present? No  Medication education provided. Patient voiced understanding? Yes  Patient able to self-manage and ready to comply? Yes  Educational resources identified? Verbal Counseling      7. Cardiomyopathy, idiopathic (HCC)  8. History of deep venous thrombosis  - I have reviewed the patient's medical history in detail and updated the computerized patient record.  - continue with Cardiology.  I appreciate their assistance in care of the patient. 9. BPH with obstruction/lower urinary tract symptoms  I have reviewed the patient's medical history in detail and updated the computerized patient record.    10. Lumbar radiculopathy  - I have reviewed the patient's medical history in detail and updated the computerized patient record.  - I discussed with patient conservative measures including rest, appropriate medications, activity modification.   - I reviewed signs/symptoms to monitor for.  - he has flared up his low back pain by riding in a low riding car.  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  - I reviewed signs/symptoms to monitor for  - he is currently working with Dermatology.   I appreciate their assistance in care of the patient.               Health Risk Assessment Questionnaire  Current Care  List of Providers you have seen in the last two years: Bhagat - cardiology ;Mcknight - dental;Braun- pain mgt  Are you receiving home health?: No  During the past 4 weeks, how would you rate your health in general?: (!) Fair    Outside Care  Since your last PCP visit, have you received care outside of [facility]?: No        Physical Activity  Do you exercise or are you physically active?: (!) No          Diet  In the past month, were you worried whether your food would run out before you or your family had money to buy more?: No  In the past 7 days, how many times did you eat fast food or junk food or pizza?: (!) 3 or more times  In the past 7 days, how many servings of fruits or vegetables did you eat each day?: (!) 2-3  In the past 7 days, how many sodas and sugar sweetened drinks (regular, not diet) did you drink each day?: 0    Smoke/Tobacco Use  Are you currently a smoker?: No      Alcohol Use  Do you drink alcohol?: No          Depression Screen  Little interest or pleasure in doing things: Not at All  Feeling down, depressed or hopeless: Not at All    Pain  How would you rate your pain today?: (!) Mild pain    Ambulation  Do you use any assistive devices for ambulation?: (!) Yes  What types of device? (select all that apply): Ephraim Hamburger    Fall Risk  Does it take you longer than 30 seconds to get up and out of a chair?: No  Have you fallen in the past year?: (!) Yes  Fall History (last 56mo): (!) One Fall without Major Injury    Motor Vehicle Safety  Do you fasten your seat belt when you are in the car?: Yes    Sun Exposure  Do you protect yourself from the sun? For example, wear sunscreen when outside.: Yes    Hearing Loss  Do you have trouble hearing the television or radio when others do not?: No  Do you have to strain or struggle to hear/understand conversation?: No  Do you use hearing aids?: No    Cognitive Impairment  During the past 12 months, have you experienced confusion or memory loss that is happening more often or is getting worse?: No    Functional Screen  Do you live alone?: No  Do you live at: Home  Can you drive your own car or travel alone by bus or taxi?: Yes  Can you shop for groceries or clothes without help?: Yes  Can you prepare your own meals?: Yes  Can you do your own housework without help?: Yes  Can you handle your own money without help?: Yes  Do you need help eating, bathing, dressing, or getting around your home?: No  Do you feel safe?: Yes  Does anyone at home hurt you, hit you, or threaten you?: No  Have you ever been the victim of abuse?: No    Home Safety  Does your home have grab bars in the bathroom?: Yes  Does your home have hand rails on stairs and steps?: Yes  Does your home have functioning smoke alarms?: Yes    Advance Directive  Do you have a living will or Advance Directive?: Yes      Dental Screen  Have you had an exam by your dentist in the last year?: Yes    Vision Screen  Do you have diabetes?: No

## 2020-10-24 NOTE — Telephone Encounter
10/24/2020 7:53 AM   Pt sent mychart requesting refills.   Has PCP appt today, if no labs completed, will order.   Has f/u w/ [facility] in October.

## 2020-10-25 ENCOUNTER — Encounter: Admit: 2020-10-25 | Discharge: 2020-10-25 | Payer: MEDICARE

## 2020-10-26 ENCOUNTER — Encounter: Admit: 2020-10-26 | Discharge: 2020-10-26 | Payer: MEDICARE

## 2020-10-26 DIAGNOSIS — E785 Hyperlipidemia, unspecified: Secondary | ICD-10-CM

## 2020-10-26 MED ORDER — EZETIMIBE 10 MG PO TAB
10 mg | ORAL_TABLET | Freq: Every day | ORAL | 0 refills | Status: AC
Start: 2020-10-26 — End: ?

## 2020-11-01 ENCOUNTER — Encounter: Admit: 2020-11-01 | Discharge: 2020-11-01 | Payer: MEDICARE

## 2020-11-01 ENCOUNTER — Ambulatory Visit: Admit: 2020-11-01 | Discharge: 2020-11-02 | Payer: MEDICARE

## 2020-11-01 DIAGNOSIS — N529 Male erectile dysfunction, unspecified: Secondary | ICD-10-CM

## 2020-11-01 DIAGNOSIS — I428 Other cardiomyopathies: Secondary | ICD-10-CM

## 2020-11-01 DIAGNOSIS — E669 Obesity, unspecified: Secondary | ICD-10-CM

## 2020-11-01 DIAGNOSIS — I351 Nonrheumatic aortic (valve) insufficiency: Secondary | ICD-10-CM

## 2020-11-01 DIAGNOSIS — I493 Ventricular premature depolarization: Secondary | ICD-10-CM

## 2020-11-01 DIAGNOSIS — M5416 Radiculopathy, lumbar region: Secondary | ICD-10-CM

## 2020-11-01 DIAGNOSIS — M5116 Intervertebral disc disorders with radiculopathy, lumbar region: Secondary | ICD-10-CM

## 2020-11-01 DIAGNOSIS — K760 Fatty (change of) liver, not elsewhere classified: Secondary | ICD-10-CM

## 2020-11-01 DIAGNOSIS — E785 Hyperlipidemia, unspecified: Secondary | ICD-10-CM

## 2020-11-01 DIAGNOSIS — I2510 Atherosclerotic heart disease of native coronary artery without angina pectoris: Secondary | ICD-10-CM

## 2020-11-01 DIAGNOSIS — I2699 Other pulmonary embolism without acute cor pulmonale: Secondary | ICD-10-CM

## 2020-11-01 DIAGNOSIS — R768 Other specified abnormal immunological findings in serum: Secondary | ICD-10-CM

## 2020-11-01 DIAGNOSIS — I10 Essential (primary) hypertension: Secondary | ICD-10-CM

## 2020-11-01 DIAGNOSIS — M1990 Unspecified osteoarthritis, unspecified site: Secondary | ICD-10-CM

## 2020-11-01 DIAGNOSIS — I712 Thoracic aortic aneurysm, without rupture: Secondary | ICD-10-CM

## 2020-11-01 DIAGNOSIS — G609 Hereditary and idiopathic neuropathy, unspecified: Secondary | ICD-10-CM

## 2020-11-01 MED ORDER — TRAMADOL 50 MG PO TAB
50 mg | ORAL_TABLET | ORAL | 2 refills | Status: AC | PRN
Start: 2020-11-01 — End: ?

## 2020-11-01 NOTE — Progress Notes
[facility] CLINIC NOTE       SUBJECTIVE: Victor Graham presents for care regarding back and leg pain.  Pain is bilateral and intermittently sharp and dull.  Walking and standing exacerbates the pain.  Pain is improved with rest.  Tramadol 50 mg which he takes about once per day plus tizanidine 2 mg and pregabalin 200 mg twice per day are keeping the pain moderate.         Review of Systems    Current Outpatient Medications:   ?  carvediloL (COREG) 6.25 mg tablet, Take one tablet by mouth twice daily., Disp: 180 tablet, Rfl: 1  ?  ezetimibe (ZETIA) 10 mg tablet, Take one tablet by mouth daily., Disp: 90 tablet, Rfl: 0  ?  furosemide (LASIX) 20 mg tablet, Take one tablet by mouth daily as needed., Disp: 90 tablet, Rfl: 0  ?  GLUCOSAMINE HCL PO, Take 1,000 mg by mouth twice daily., Disp: , Rfl:   ?  lisinopril (ZESTRIL) 10 mg tablet, Take one tablet by mouth daily., Disp: 90 tablet, Rfl: 0  ?  meloxicam (MOBIC) 15 mg tablet, Take one-half tablet to one tablet by mouth daily as needed. (Patient taking differently: Take 7.5-15 mg by mouth twice daily as needed.), Disp: 90 tablet, Rfl: 3  ?  metFORMIN (GLUCOPHAGE) 1,000 mg tablet, Take one tablet by mouth twice daily with meals., Disp: 180 tablet, Rfl: 3  ?  pregabalin (LYRICA) 200 mg capsule, TAKE 1 CAPSULE TWICE A DAY, Disp: 180 capsule, Rfl: 3  ?  rivaroxaban (XARELTO) 20 mg tablet, Take one tablet by mouth daily with dinner., Disp: 90 tablet, Rfl: 3  ?  rosuvastatin (CRESTOR) 20 mg tablet, Take one tablet by mouth daily., Disp: 90 tablet, Rfl: 3  ?  SAW PALMETTO PO, Take  by mouth., Disp: , Rfl:   ?  spironolactone (ALDACTONE) 25 mg tablet, Take one tablet by mouth daily., Disp: 90 tablet, Rfl: 0  ?  tiZANidine (ZANAFLEX) 2 mg tablet, Take one tablet by mouth every 8 hours. (Patient taking differently: Take 2 mg by mouth twice daily.), Disp: 90 tablet, Rfl: 11  ?  traMADoL (ULTRAM) 50 mg tablet, Take one tablet by mouth every 8 hours as needed., Disp: 90 tablet, Rfl: 2  Allergies   Allergen Reactions   ? Other [Unclassified Drug] BLISTERS     dissolving stiches in mouth after dental surgery caused blisters   ? Lipitor [Atorvastatin] FLUSHING (SKIN)   ? Niaspan [Niacin] FLUSHING (SKIN)   ? Rubber SEE COMMENTS     Neoprene rubber causes contact dermatitis (CAUSED BY THIOUREA IN NEOPRENE)     Physical Exam  Vitals:    11/01/20 1303   BP: 131/69   Pulse: 83   SpO2: 96%   PainSc: Three   Weight: 131.5 kg (290 lb)   Height: 177.8 cm (5' 10)     Oswestry Total Score:: 46  Pain Score: Three  Body mass index is 41.61 kg/m?Marland Kitchen  General: Alert and oriented, very pleasant male.   HEENT showed extraocular muscles were intact and no other abnormalities.  Unlabored breathing.  Regular rate and rhythm on CV exam.   5/5 strength in bilateral upper and lower extremities.    Sensation is intact to light touch and equal in the upper and lower extremities.         IMPRESSION:  1. Lumbar disc disease with radiculopathy    2. Lumbar radicular pain  PLAN: Will refill medications as described above and continue with home exercise plan as prescribed by physical therapy.  Follow-up in 6 months or sooner if needed.

## 2020-11-01 NOTE — Patient Instructions
It was nice to see you today. Thank you for choosing to visit our clinic.      Your time is important and if you had to wait today, we do apologize. Our goal is to run exactly on time; however, on occasion, we get behind in clinic due to unexpected patient issues. Thank you for your patience.    General Instructions:   How to reach me: Please send a MyChart message to the [facility] or leave a voicemail for my nurse, Cara , at [phone].   How to get a medication refill: Please use the MyChart Refill request or contact your pharmacy directly to request medication refills. Please allow 72 business hours for request to be completed.   How to receive your test results: If you have signed up for MyChart, you will receive your test results and messages from me this way. Otherwise, you will get a phone call or letter. If you are expecting results and have not heard from my office within 2 weeks of your testing, please send a MyChart message or call my office.   Scheduling: Our scheduling phone number is [phone].   Appointment Reminders on your cell phone: Communication preferences can be managed in MyChart to ensure you receive important appointment notifications.   Support for many chronic illnesses is available through [facility]: [facility] or [phone].   For questions on nights, weekends or holidays, call the Operator at [phone], and ask for the doctor on call for [facility] Pain Management.      Again, thank you for coming in today.    ________________________________________________________________

## 2020-11-17 ENCOUNTER — Encounter: Admit: 2020-11-17 | Discharge: 2020-11-17 | Payer: MEDICARE

## 2020-11-18 ENCOUNTER — Encounter: Admit: 2020-11-18 | Discharge: 2020-11-18 | Payer: MEDICARE

## 2020-11-18 ENCOUNTER — Ambulatory Visit: Admit: 2020-11-18 | Discharge: 2020-11-18 | Payer: MEDICARE

## 2020-11-18 DIAGNOSIS — Z136 Encounter for screening for cardiovascular disorders: Secondary | ICD-10-CM

## 2020-11-20 ENCOUNTER — Encounter: Admit: 2020-11-20 | Discharge: 2020-11-20 | Payer: MEDICARE

## 2020-11-21 ENCOUNTER — Encounter: Admit: 2020-11-21 | Discharge: 2020-11-21 | Payer: MEDICARE

## 2020-11-21 MED ORDER — RIVAROXABAN 20 MG PO TAB
20 mg | ORAL_TABLET | Freq: Every day | ORAL | 3 refills | 30.00000 days | Status: AC
Start: 2020-11-21 — End: ?

## 2020-11-21 NOTE — Telephone Encounter
Labs are up to date

## 2020-12-01 ENCOUNTER — Encounter: Admit: 2020-12-01 | Discharge: 2020-12-01 | Payer: MEDICARE

## 2020-12-01 ENCOUNTER — Ambulatory Visit: Admit: 2020-12-01 | Discharge: 2020-12-01 | Payer: MEDICARE

## 2020-12-01 DIAGNOSIS — M1990 Unspecified osteoarthritis, unspecified site: Secondary | ICD-10-CM

## 2020-12-01 DIAGNOSIS — I2510 Atherosclerotic heart disease of native coronary artery without angina pectoris: Secondary | ICD-10-CM

## 2020-12-01 DIAGNOSIS — I428 Other cardiomyopathies: Secondary | ICD-10-CM

## 2020-12-01 DIAGNOSIS — R768 Other specified abnormal immunological findings in serum: Secondary | ICD-10-CM

## 2020-12-01 DIAGNOSIS — I2699 Other pulmonary embolism without acute cor pulmonale: Secondary | ICD-10-CM

## 2020-12-01 DIAGNOSIS — I351 Nonrheumatic aortic (valve) insufficiency: Secondary | ICD-10-CM

## 2020-12-01 DIAGNOSIS — I83008 Varicose veins of unspecified lower extremity with ulcer other part of lower leg: Secondary | ICD-10-CM

## 2020-12-01 DIAGNOSIS — I712 Thoracic aortic aneurysm, without rupture: Secondary | ICD-10-CM

## 2020-12-01 DIAGNOSIS — M5416 Radiculopathy, lumbar region: Secondary | ICD-10-CM

## 2020-12-01 DIAGNOSIS — K760 Fatty (change of) liver, not elsewhere classified: Secondary | ICD-10-CM

## 2020-12-01 DIAGNOSIS — N529 Male erectile dysfunction, unspecified: Secondary | ICD-10-CM

## 2020-12-01 DIAGNOSIS — E785 Hyperlipidemia, unspecified: Secondary | ICD-10-CM

## 2020-12-01 DIAGNOSIS — I10 Essential (primary) hypertension: Secondary | ICD-10-CM

## 2020-12-01 DIAGNOSIS — I493 Ventricular premature depolarization: Secondary | ICD-10-CM

## 2020-12-01 DIAGNOSIS — E669 Obesity, unspecified: Secondary | ICD-10-CM

## 2020-12-01 DIAGNOSIS — G609 Hereditary and idiopathic neuropathy, unspecified: Secondary | ICD-10-CM

## 2020-12-01 MED ORDER — BACITRACIN 500 UNIT/GRAM TP OINT
1 g | Freq: Two times a day (BID) | TOPICAL | 0 refills | 14.00000 days | Status: AC
Start: 2020-12-01 — End: ?

## 2020-12-01 NOTE — Telephone Encounter
Disposition: Urgent Care - patient was insistent he was leaving town today so advised he be seen at closest location to him which is urgent care.    Triage Summary: Patient has had history of right leg swelling with prior scarring on that leg but recent swelling in left leg.  Now has sores that are weeping on both legs with an appointment scheduled to see provider on 12/06/20.  Strongly advised patient to be seen prior to his leaving town today explaining he has multiple health conditions that need to be considered for evaluation of his leg condition.  Patient stated he would go to an urgent care close to his home directly.    Reason for Disposition  ? SEVERE swelling (e.g., swelling extends above knee, entire leg is swollen, weeping fluid)    Answer Assessment - Initial Assessment Questions  1. ONSET: When did the swelling start? (e.g., minutes, hours, days)      Leg swelling started months ago - leg sores started with weeping within last 6 weeks    2. LOCATION: What part of the leg is swollen?  Are both legs swollen or just one leg?      Larger sore on right leg size of half dollar with swelling due to prior surgery - Left leg had never wept before but started a couple of weeks ago.     3. SEVERITY: How bad is the swelling? (e.g., localized; mild, moderate, severe)   - Localized - small area of swelling localized to one leg   - MILD pedal edema - swelling limited to foot and ankle, pitting edema < 1/4 inch (6 mm) deep, rest and elevation eliminate most or all swelling   - MODERATE edema - swelling of lower leg to knee, pitting edema > 1/4 inch (6 mm) deep, rest and elevation only partially reduce swelling   - SEVERE edema - swelling extends above knee, facial or hand swelling present       Gets better and gets worse - skin is tight and dry - leaves a dimple but retracts - legs are swollen less in the morning and bilateral at calves, ankles and feet    4. REDNESS: Does the swelling look red or infected? Little redness, blue, some discoloration    5. PAIN: Is the swelling painful to touch? If Yes, ask: How painful is it?   (Scale 1-10; mild, moderate or severe)      Pain is scale of 1 with occasional shooting pain - has nerve problems with neuropathy    6. FEVER: Do you have a fever? If Yes, ask: What is it, how was it measured, and when did it start?       None    7. CAUSE: What do you think is causing the leg swelling?      Right leg due to scarring - no idea for left leg - decreasing salt and using compression sleeves    8. MEDICAL HISTORY: Do you have a history of heart failure, kidney disease, liver failure, or cancer?      Heart disease, prior history of DVT, obesity and diabetes    9. RECURRENT SYMPTOM: Have you had leg swelling before? If Yes, ask: When was the last time? What happened that time?      Swelling has not ever gone completely away - depends how much on feet and how much he can prop his feet up - is on a water pill    10. OTHER SYMPTOMS: Do  you have any other symptoms? (e.g., chest pain, difficulty breathing)        No    11. PREGNANCY: Is there any chance you are pregnant? When was your last menstrual period?        NA    Protocols used: LEG SWELLING AND EDEMA-A-OH

## 2020-12-01 NOTE — Progress Notes
Date of Service: 12/01/2020    Victor Graham is a 70 y.o. male.  DOB: 1950/05/24  MRN: [id number]     Subjective:             Chief Complaint   Patient presents with   ? Leg Swelling     Bilateral leg swelling and open wounds. Started a few months. Hx RLE of a fasciotomy (July 1993)      History of Present Illness:    Victor Graham is a pleasant 70 y.o. male who presents today with bilateral leg wounds X 1 week.  He has long history of leg swelling with his right leg chronically swollen due to a previous DVT and fasciotomy.  He also says that his left leg has begun swelling over the previous few months.  He saw his PCP for this who recommended compression stockings which he says he wears though not regularly.  He has not noticed any improvement in swelling while wearing the compression socks.  He says last week he noticed a blister over his right mid tibia and then 2 smaller blisters over his left anterior tibia 2 days ago.  He says that the blisters popped and opened revealing open tissue underneath.  He has not noticed any spreading erythema or drainage from the wounds though they do occasionally weep blood.  He says they do not seem to be improving.  He has kept them covered and has been applying Neosporin.  He says they are only mildly tender and do not limit him.  He did contact his PCPs office who recommended he come to the urgent care to be evaluated for possible cellulitis.  He otherwise has no complaints.    Review of Systems   Constitutional: Negative for chills, fatigue and fever.   Respiratory: Negative for cough, chest tightness, shortness of breath and wheezing.    Cardiovascular: Negative for chest pain and palpitations.   Gastrointestinal: Negative for diarrhea, nausea and vomiting.   Musculoskeletal: Positive for gait problem and myalgias. Negative for arthralgias.   Skin: Positive for wound. Negative for rash.         Allergies   Allergen Reactions   ? Other [Unclassified Drug] BLISTERS     dissolving stiches in mouth after dental surgery caused blisters   ? Lipitor [Atorvastatin] FLUSHING (SKIN)   ? Niaspan [Niacin] FLUSHING (SKIN)   ? Rubber SEE COMMENTS     Neoprene rubber causes contact dermatitis (CAUSED BY THIOUREA IN NEOPRENE)      ? Bacitracin 500 unit/gram oint Apply one g topically to affected area twice daily for 14 days.   ? carvediloL (COREG) 6.25 mg tablet Take one tablet by mouth twice daily.   ? ezetimibe (ZETIA) 10 mg tablet Take one tablet by mouth daily.   ? furosemide (LASIX) 20 mg tablet Take one tablet by mouth daily as needed.   ? GLUCOSAMINE HCL PO Take 1,000 mg by mouth twice daily.   ? lisinopril (ZESTRIL) 10 mg tablet Take one tablet by mouth daily.   ? meloxicam (MOBIC) 15 mg tablet Take one-half tablet to one tablet by mouth daily as needed. (Patient taking differently: Take 7.5-15 mg by mouth twice daily as needed.)   ? metFORMIN (GLUCOPHAGE) 1,000 mg tablet Take one tablet by mouth twice daily with meals.   ? pregabalin (LYRICA) 200 mg capsule TAKE 1 CAPSULE TWICE A DAY   ? rivaroxaban (XARELTO) 20 mg tablet Take one tablet by mouth daily with dinner.   ?  rosuvastatin (CRESTOR) 20 mg tablet Take one tablet by mouth daily.   ? SAW PALMETTO PO Take  by mouth.   ? spironolactone (ALDACTONE) 25 mg tablet Take one tablet by mouth daily.   ? tiZANidine (ZANAFLEX) 2 mg tablet Take one tablet by mouth every 8 hours. (Patient taking differently: Take 2 mg by mouth twice daily.)   ? traMADoL (ULTRAM) 50 mg tablet Take one tablet by mouth every 8 hours as needed.       Medical History:   Diagnosis Date   ? Arthritis    ? Ascending aortic aneurysm (HCC) 01/08/2008   ? Asymptomatic PVCs 03/11/2015    03/2015 per pt, has had them for a long time & does not feel them usually    ? CAD (coronary artery disease)    ? Dyslipidemia    ? Erectile dysfunction    ? Fatty liver    ? HLD (hyperlipidemia) 01/08/2008   ? Hypertension 01/08/2008   ? Idiopathic polyneuropathy    ? Lumbar radiculopathy    ? NICM (nonischemic cardiomyopathy) (HCC)    ? 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 Obesity    ? Positive ANA (antinuclear antibody) 02/03/2016   ? Positive ANA (antinuclear antibody) 02/03/2016   ? Pulmonary embolism [facility]) 2015 -- july 20th    takes xaralto anticoagulant -- treated at [facility]     Surgical History:   Procedure Laterality Date   ? FASCIOTOMY Right 1992   ? KNEE SURGERY Right 02/2007    Torn meniscus removed   ? HX LUMBAR FUSION  02/2008    L4-5   ? AAA REPAIR  06/2008   ? HX JOINT REPLACEMENT Right 06/2010   ? HERNIA REPAIR  01/2013    x2   ? KNEE REPLACEMENT Left 03/2013   ? CYST REMOVAL Left 06/2013    wrist   ? STIMULATOR IMPLANT  12/2014    Spinal stimulator [employer]   ? CYST REMOVAL Right 08/2017    wrist   ? SHOULDER SURGERY  08/2018    reversed arthroplasty   ? COLONOSCOPY DIAGNOSTIC WITH SPECIMEN COLLECTION BY BRUSHING/ WASHING - FLEXIBLE N/A 03/18/2019    Performed by Lenor Derrick, MD at [facility] OR   ? COLONOSCOPY WITH CONTROL OF BLEEDING N/A 03/18/2019    Performed by Lenor [facility]rick[facility] MD at [facility] OR   ? COLONOSCOPY WITH SUBMUCOSAL INJECTION N/A 03/18/2019    Performed by Lenor [facility]rick, [facility] at [facility] OR   ? PR LAPAROSCOPY SURG RPR INITIAL INGUINAL HERNIA       Family History   Problem Relation Age of Onset   ? DVT Mother    ? Cancer Father    ? DVT Brother    ? Stroke Paternal Grandfather    ? Cancer Paternal Grandmother      Social History     Socioeconomic History   ? Marital status: Married   Tobacco Use   ? Smoking status: Former Smoker     Years: 20.00     Quit date: 08/24/1991     Years since quitting: 29.2   ? Smokeless tobacco: Never Used   Substance and Sexual Activity   ? Alcohol use: No     Alcohol/week: 0.0 standard drinks   ? Drug use: No   ? Sexual activity: Not Currently     Partners: Female   Other Topics Concern   ? Hotel manager  Service Yes     CommentPersonnel officer, [employer]                Objective: Vitals:    12/01/20 1400   BP: 110/76   Pulse: 79   Temp: 36.7 ?C (98.1 ?F)   Resp: 18   SpO2: 94%   TempSrc: Oral   Weight: 129.3 kg (285 lb)   Height: 177.8 cm (5' 10)     Body mass index is 40.89 kg/m?Marland Kitchen     Physical Exam  Vitals reviewed.   Constitutional:       General: He is not in acute distress.     Appearance: Normal appearance. He is not ill-appearing.   HENT:      Head: Normocephalic.      Jaw: There is normal jaw occlusion.      Salivary Glands: Right salivary gland is not diffusely enlarged. Left salivary gland is not diffusely enlarged.      Nose: Nose normal.   Cardiovascular:      Rate and Rhythm: Normal rate and regular rhythm.      Heart sounds: No murmur heard.    No friction rub. No gallop.   Pulmonary:      Effort: Pulmonary effort is normal. No respiratory distress.      Breath sounds: No wheezing, rhonchi or rales.   Skin:     General: Skin is warm and dry.      Findings: No rash.      Comments: There is a 3 cm x 3 cm circular wound with red granulation tissue at its base at the right mid anterior tibia.  There is surrounding purple/darkened tissue around this area at the anterior tibia.  Laterally there is a healed surgical scar from a previous fasciotomy.    On the left there is a 2.0 cm x 2.0 cm adjacent to a 1.0 cm x 1.0 cm open wounds with granulation tissue at their base.  There is also peripheral/ecchymotic skin surrounding this area.   Neurological:      General: No focal deficit present.      Mental Status: He is alert and oriented to person, place, and time.   Psychiatric:         Mood and Affect: Mood normal.         Behavior: Behavior normal.         Thought Content: Thought content normal.                    Assessment and Plan:  Nash Shearer was seen today for leg swelling.    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       Exam is consistent with bilateral ulcers due to venous stasis.  There is good granulation tissue at the base of all 3 wounds.  No sign of cellulitis surrounding the wounds.  Patient was provided bacitracin packets as well as a prescription for bacitracin since he will be leaving for trip directly after this clinic visit.  Recommend he change the dressings twice daily.  He should continue to wear compression stockings and keep his feet elevated while at rest.  He should follow-up with  his PCP as is scheduled.

## 2020-12-01 NOTE — Telephone Encounter
Appointment scheduled from MyChart     Victor Fake, RN routed conversation to [employer] Nurse Triage Pool 3 hours ago (7:13 AM)      Victor Austria, RN routed conversation to Victor Fake, RN 17 hours ago (4:44 PM)     Batch,   Victor Graham 2 days ago     Dayna Barker, MD 2 days ago           Patient Questionnaire Submission  --------------------------------  ?  Questionnaire: Primary Reason for Visit  ?  Question: What is the primary reason for your visit?  Answer:   Other  ?  Questionnaire: Other Symptom Questionnaire  ?  Question: What medical problem brings you in to see the provider?  Answer:   Swelling lower extremities, sores breaking out & weepong sores.  ?  Question: Your symptom is...  Answer:   new problem  ?  Question: When did they start?  Answer:   More than 1 month ago  ?  Question: How often do you feel this symptom?  Answer:   Constantly  ?  Question: How has the symptom changed?  Answer:   Always present, but gets better and worse  ?  Question: On a scale of 0 to 10 (10 being the worst), how strong is your pain?  Answer:   3  ?  Question: How severe is your pain?  Answer:   Mild  ?  ~~~~~~~~~~~~~~~~~~~~~~~~~~~~~~~~  Do you have:       Question: Abdominal pain      Answer:   No  ?      Question: No appetite      Answer:   No  ?      Question: Joint Pain      Answer:   No  ?      Question: Change in bowel habit      Answer:   No  ?      Question: Chest pain      Answer:   No  ?      Question: Chills      Answer:   No  ?      Question: Congestion      Answer:   No  ?      Question: Cough      Answer:   No  ?      Question: Sweating much more than normal      Answer:   Yes  ?      Question: Fatigue      Answer:   No  ?      Question: Fever      Answer:   No  ?      Question: Headaches      Answer:   No  ?      Question: Joint swelling      Answer:   No  ?      Question: Muscle pain      Answer:   No  ?      Question: Nausea      Answer:   No  ? Question: Neck pain      Answer:   No  ?      Question: Numbness      Answer:   Yes  ?      Question: Rash      Answer:   No  ?      Question: Sore throat  Answer:   No  ?      Question: Swollen glands      Answer:   No  ?      Question: Urinary symptoms      Answer:   No  ?      Question: Vertigo      Answer:   No  ?      Question: Visual change      Answer:   No  ?      Question: Vomiting      Answer:   No  ?      Question: Weakness      Answer:   No  ?  ?  ?  Question: Which of the following, if any, make(s) your symptom worse?  Answer:   nothing  ?  Question: Which of the following treatments have you tried?  Answer:   relaxation            rest  ?  Question: If you've tried a treatment for this problem, how much relief did you experience?  Answer:   no relief         Dayna Barker, MD 2 days ago           Patient Questionnaire Submission  --------------------------------  ?  Questionnaire: Primary Care Return Patient Questionnaire  ?  Question: Do you need any medication refills today?  Answer:   I'm Victor Greenspan, MD 2 days ago           Patient Questionnaire Submission  --------------------------------  ?  Questionnaire: Fall Risk  ?  Question: Do any of these situations describe you?  Answer:   Use of assistive device         Dayna Barker, MD 2 days ago           Patient Questionnaire Submission  --------------------------------  ?  Questionnaire: Medicare Secondary Payer Questionnaire  ?  Question: Medicare requires that we periodically ask the following questions.  Answer:     ?  Question: Are you receiving Black Lung (BL) benefits?  Answer:   No  ?  Question: Are the services to be paid by a government research program?  Answer:   No  ?  Question: Are you entitled to benefits through the Department of [employer] ([place])?  Answer:   No  ?  Question: Was the illness/injury due to a work-related accident/condition?  Answer:   No  ?  Questionnaire: PATIENT-ENTERED CMS MSP: PART II  ?  Question: Was the illness/injury due to a non-work-related accident?  Answer:   No  ?  Questionnaire: PATIENT-ENTERED CMS MSP: PART III  ?  ~~~~~~~~~~~~~~~~~~~~~~~~~~~~~~~~  Are you entitled to Medicare based on:      Question: Age?      Answer:   Yes  ?      Question: End-stage renal disease (ESRD)?      Answer:   No  ?  ?  ?  Question:    Answer:     ?  Questionnaire: PATIENT-ENTERED CMS MSP: PART IV SECTION 1  ?  Question: Are you currently employed?  Answer:   No, Retired  ?  Question: Date of retirement:  Answer:   03/08/2012  ?  Question: Do you have a spouse who is currently employed?  Answer:   No, Retired  ?  Question: Date of retirement:  Answer:   01/06/2013  ?  Questionnaire: Medicare Secondary Payer Questionnaire  ?  Question: Thank you for answering this questionnaire.  Answer:             Victor Graham routed conversation to Victor Austria, RN 2 days ago     Victor Graham  Patient Appointment Schedule Request Pool 2 days ago           Appointment For: Victor Graham ([id number])  Visit Type: RETURN PATIENT (226)  ?  12/06/2020   10:00 AM  30 mins.  Bufford Buttner, MD      [facility]  ?  Patient Comments:  Swollen calve an sores on legs.

## 2020-12-06 ENCOUNTER — Encounter: Admit: 2020-12-06 | Discharge: 2020-12-06 | Payer: MEDICARE

## 2020-12-06 ENCOUNTER — Ambulatory Visit: Admit: 2020-12-06 | Discharge: 2020-12-06 | Payer: MEDICARE

## 2020-12-06 DIAGNOSIS — I429 Cardiomyopathy, unspecified: Secondary | ICD-10-CM

## 2020-12-06 DIAGNOSIS — I493 Ventricular premature depolarization: Secondary | ICD-10-CM

## 2020-12-06 DIAGNOSIS — E785 Hyperlipidemia, unspecified: Secondary | ICD-10-CM

## 2020-12-06 DIAGNOSIS — M1990 Unspecified osteoarthritis, unspecified site: Secondary | ICD-10-CM

## 2020-12-06 DIAGNOSIS — M5416 Radiculopathy, lumbar region: Secondary | ICD-10-CM

## 2020-12-06 DIAGNOSIS — E1142 Type 2 diabetes mellitus with diabetic polyneuropathy: Secondary | ICD-10-CM

## 2020-12-06 DIAGNOSIS — R609 Edema, unspecified: Principal | ICD-10-CM

## 2020-12-06 DIAGNOSIS — K760 Fatty (change of) liver, not elsewhere classified: Secondary | ICD-10-CM

## 2020-12-06 DIAGNOSIS — I351 Nonrheumatic aortic (valve) insufficiency: Secondary | ICD-10-CM

## 2020-12-06 DIAGNOSIS — I10 Essential (primary) hypertension: Secondary | ICD-10-CM

## 2020-12-06 DIAGNOSIS — I2510 Atherosclerotic heart disease of native coronary artery without angina pectoris: Secondary | ICD-10-CM

## 2020-12-06 DIAGNOSIS — L98499 Non-pressure chronic ulcer of skin of other sites with unspecified severity: Secondary | ICD-10-CM

## 2020-12-06 DIAGNOSIS — I2699 Other pulmonary embolism without acute cor pulmonale: Secondary | ICD-10-CM

## 2020-12-06 DIAGNOSIS — I872 Venous insufficiency (chronic) (peripheral): Secondary | ICD-10-CM

## 2020-12-06 DIAGNOSIS — G609 Hereditary and idiopathic neuropathy, unspecified: Secondary | ICD-10-CM

## 2020-12-06 DIAGNOSIS — R768 Other specified abnormal immunological findings in serum: Secondary | ICD-10-CM

## 2020-12-06 DIAGNOSIS — N529 Male erectile dysfunction, unspecified: Secondary | ICD-10-CM

## 2020-12-06 DIAGNOSIS — E669 Obesity, unspecified: Secondary | ICD-10-CM

## 2020-12-06 DIAGNOSIS — I712 Thoracic aortic aneurysm, without rupture: Secondary | ICD-10-CM

## 2020-12-06 DIAGNOSIS — I428 Other cardiomyopathies: Secondary | ICD-10-CM

## 2020-12-06 NOTE — Progress Notes
Date of Service: 12/06/2020    Victor Graham is a 69 y.o. male.  DOB: Nov 05, 1950  MRN: [id number]     Subjective:             History of Present Illness    Patient is here to evaluate his chronic bilateral lower extremity peripheral edema and ulcerations.  He reports this has been present (R>L) for many years.  He reports his right LE has had edema since his fasciotomy in 1992.  He has cardiomyopathy and has been working with Cardiology.  He has used diuretics, fluid restrictions, dietary changes and compression garments as treatment.    Medical History:   Diagnosis Date   ? Arthritis    ? Ascending aortic aneurysm (HCC) 01/08/2008   ? Asymptomatic PVCs 03/11/2015    03/2015 per pt, has had them for a long time & does not feel them usually    ? CAD (coronary artery disease)    ? Dyslipidemia    ? Erectile dysfunction    ? Fatty liver    ? HLD (hyperlipidemia) 01/08/2008   ? Hypertension 01/08/2008   ? Idiopathic polyneuropathy    ? Lumbar radiculopathy    ? NICM (nonischemic cardiomyopathy) (HCC)    ? 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 Obesity    ? Positive ANA (antinuclear antibody) 02/03/2016   ? Positive ANA (antinuclear antibody) 02/03/2016   ? Pulmonary embolism [facility]) 2015 -- july 20th    takes xaralto anticoagulant -- treated at [facility]     Surgical History:   Procedure Laterality Date   ? FASCIOTOMY Right 1992   ? KNEE SURGERY Right 02/2007    Torn meniscus removed   ? HX LUMBAR FUSION  02/2008    L4-5   ? AAA REPAIR  06/2008   ? HX JOINT REPLACEMENT Right 06/2010   ? HERNIA REPAIR  01/2013    x2   ? KNEE REPLACEMENT Left 03/2013   ? CYST REMOVAL Left 06/2013    wrist   ? STIMULATOR IMPLANT  12/2014    Spinal stimulator [employer]   ? CYST REMOVAL Right 08/2017    wrist   ? SHOULDER SURGERY  08/2018    reversed arthroplasty   ? COLONOSCOPY DIAGNOSTIC WITH SPECIMEN COLLECTION BY BRUSHING/ WASHING - FLEXIBLE N/A 03/18/2019    Performed by Lenor Derrick, MD at [facility] OR   ? COLONOSCOPY WITH CONTROL OF BLEEDING N/A 03/18/2019    Per[facility] by [facility] at [facility] OR   ? COLONOSCOPY WITH SUBMUCOSAL INJECTION N/A 03/18/2019    Per[facility] at [facility] OR   ? PR LAPAROSCOPY SURG RPR INITIAL INGUINAL HERNIA       Family History   Problem Relation Age of Onset   ? DVT Mother    ? Cancer Father    ? DVT Brother    ? Stroke Paternal Grandfather    ? Cancer Paternal Grandmother      Social History     Socioeconomic History   ? Marital status: Married   Tobacco Use   ? Smoking status: Former Smoker     Years: 20.00     Quit date: 08/24/1991     Years since quitting: 29.3   ? Smokeless tobacco: Never Used   Substance and Sexual Activity   ? Alcohol use: No     Alcohol/week: 0.0 standard drinks   ?  Drug use: No   ? Sexual activity: Not Currently     Partners: Female   Other Topics Concern   ? Financial planner Yes     CommentPersonnel officer, [employer]                        Review of Systems   Constitutional: Negative for chills, fatigue and fever.   HENT: Negative for congestion and sore throat.    Respiratory: Negative for cough.    Cardiovascular: Positive for leg swelling. Negative for chest pain.   Gastrointestinal: Negative for abdominal pain, nausea and vomiting.   Musculoskeletal: Positive for gait problem and myalgias.   Skin: Positive for color change and wound. Negative for rash.   Neurological: Positive for weakness and numbness. Negative for headaches.         Objective:         ? Bacitracin 500 unit/gram oint Apply one g topically to affected area twice daily for 14 days.   ? carvediloL (COREG) 6.25 mg tablet Take one tablet by mouth twice daily.   ? ezetimibe (ZETIA) 10 mg tablet Take one tablet by mouth daily.   ? furosemide (LASIX) 20 mg tablet Take one tablet by mouth daily as needed.   ? GLUCOSAMINE HCL PO Take 1,000 mg by mouth twice daily.   ? lisinopril (ZESTRIL) 10 mg tablet Take one tablet by mouth daily.   ? meloxicam (MOBIC) 15 mg tablet Take one-half tablet to one tablet by mouth daily as needed. (Patient taking differently: Take 7.5-15 mg by mouth twice daily as needed.)   ? metFORMIN (GLUCOPHAGE) 1,000 mg tablet Take one tablet by mouth twice daily with meals.   ? pregabalin (LYRICA) 200 mg capsule TAKE 1 CAPSULE TWICE A DAY   ? rivaroxaban (XARELTO) 20 mg tablet Take one tablet by mouth daily with dinner.   ? rosuvastatin (CRESTOR) 20 mg tablet Take one tablet by mouth daily.   ? SAW PALMETTO PO Take  by mouth.   ? spironolactone (ALDACTONE) 25 mg tablet Take one tablet by mouth daily.   ? tiZANidine (ZANAFLEX) 2 mg tablet Take one tablet by mouth every 8 hours. (Patient taking differently: Take 2 mg by mouth twice daily.)   ? traMADoL (ULTRAM) 50 mg tablet Take one tablet by mouth every 8 hours as needed.     Vitals:    12/06/20 0951 12/06/20 1007   BP: (!) 83/65 100/62   BP Source: Arm, Right Upper Arm, Left Upper   Pulse: 76    Temp: 36.7 ?C (98 ?F)    Resp: 18    SpO2: 97%    TempSrc: Temporal    PainSc: Two    Weight: 129.7 kg (286 lb)    Height: 177.8 cm (5' 10)      Body mass index is 41.04 kg/m?Marland Kitchen     Physical Exam  Vitals reviewed.   Constitutional:       Appearance: Normal appearance. He is well-developed.   HENT:      Head: Normocephalic and atraumatic.      Nose: Nose normal.   Eyes:      Conjunctiva/sclera: Conjunctivae normal.   Cardiovascular:      Rate and Rhythm: Normal rate and regular rhythm.      Pulses: Normal pulses.      Heart sounds: No murmur heard.  Pulmonary:      Effort: Pulmonary effort is normal.      Breath  sounds: No rales.   Musculoskeletal:      Right lower leg: Edema present.      Left lower leg: Edema present.        Legs:    Skin:     General: Skin is warm.      Capillary Refill: Capillary refill takes less than 2 seconds.   Neurological:      Mental Status: He is alert.      Coordination: Coordination normal.   Psychiatric:         Behavior: Behavior normal.         Thought Content: Thought content normal.         Judgment: Judgment normal.              Assessment and Plan:  1. Peripheral edema  2. Chronic venous insufficiency of lower extremity  3. 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 (HCC)  - I have reviewed the patient's medical history in detail and updated the computerized patient record.  - does not appear to be infected at this point  - I reviewed signs/symptoms to monitor for  - I reviewed importance of keeping feet up at night and wearing compression sleeve/socks  - will refer to vascular surgery for evaluation  - AMB REFERRAL TO VASCULAR SURGERY    4. Type 2 diabetes mellitus with diabetic polyneuropathy, with long-term current use of insulin (HCC)  - I have reviewed the patient's medical history in detail and updated the computerized patient record.      5. Cardiomyopathy, idiopathic (HCC)  - I have reviewed the patient's medical history in detail and updated the computerized patient record.  - continue to work with Cardiology.   I appreciate their assistance in care of the patient.

## 2020-12-12 ENCOUNTER — Encounter: Admit: 2020-12-12 | Discharge: 2020-12-12 | Payer: MEDICARE

## 2020-12-20 ENCOUNTER — Encounter: Admit: 2020-12-20 | Discharge: 2020-12-20 | Payer: MEDICARE

## 2020-12-20 NOTE — Telephone Encounter
Please assist Scheduling Department with getting the patient worked in sooner than the first availabillity on provider's schedule due to patient having wound/ulcer.  Thank you.

## 2020-12-21 ENCOUNTER — Encounter: Admit: 2020-12-21 | Discharge: 2020-12-21 | Payer: MEDICARE

## 2020-12-21 DIAGNOSIS — R609 Edema, unspecified: Secondary | ICD-10-CM

## 2020-12-21 DIAGNOSIS — I872 Venous insufficiency (chronic) (peripheral): Secondary | ICD-10-CM

## 2020-12-28 ENCOUNTER — Encounter: Admit: 2020-12-28 | Discharge: 2020-12-28 | Payer: MEDICARE

## 2020-12-30 ENCOUNTER — Encounter: Admit: 2020-12-30 | Discharge: 2020-12-30 | Payer: MEDICARE

## 2021-01-03 ENCOUNTER — Encounter: Admit: 2021-01-03 | Discharge: 2021-01-03 | Payer: MEDICARE

## 2021-01-03 NOTE — Progress Notes
Patient Name:  Victor Graham  MRN:  [id number]  DOB:  08-Dec-1950  Insurance:  Payor: MEDICARE / Plan: MEDICARE PART A AND B / Product Type: Medicare /          Reason for Visit:  New patient   Diagnosis: Veins      Physician Info:   ? Referring Physician:  No ref. provider found   ? PCP:  Bufford Buttner      History of Present Illness:  70 year old male referred for varicose veins with ulceration (see Dr. Abel Presto referral note).      Scheduled testing:       Location of Films:      Date: Imaging: Impression:   01/04/2021 bil ven reflux See worksheet                    Medical History   has a past medical history of Arthritis, Ascending aortic aneurysm (01/08/2008), Asymptomatic PVCs (03/11/2015), CAD (coronary artery disease), Dyslipidemia, Erectile dysfunction, Fatty liver, HLD (hyperlipidemia) (01/08/2008), Hypertension (01/08/2008), Idiopathic polyneuropathy, Lumbar radiculopathy, NICM (nonischemic cardiomyopathy) (HCC), Nonrheumatic aortic valve insufficiency (09/13/2015), Obesity, Positive ANA (antinuclear antibody) (02/03/2016), Positive ANA (antinuclear antibody) (02/03/2016), and Pulmonary embolism [facility]) (2015 -- july 20th).          Surgical History  has a past surgical history that includes joint replacement (Right, 06/2010); Hernia repair (01/2013); fasciotomy (Right, 1992); laparoscopy surg rpr initial inguinal hernia; knee surgery (Right, 02/2007); aaa repair (06/2008); knee replacement (Left, 03/2013); cyst removal (Left, 06/2013); lumbar fusion (02/2008); stimulator implant (12/2014); cyst removal (Right, 08/2017); shoulder surgery (08/2018); Colonoscopy (N/A, 03/18/2019); Colonoscopy (N/A, 03/18/2019); and Colonoscopy (N/A, 03/18/2019).    Allergies   Allergies   Allergen Reactions   ? Other [Unclassified Drug] BLISTERS     dissolving stiches in mouth after dental surgery caused blisters   ? Lipitor [Atorvastatin] FLUSHING (SKIN)   ? Niaspan [Niacin] FLUSHING (SKIN)   ? Rubber SEE COMMENTS Neoprene rubber causes contact dermatitis (CAUSED BY THIOUREA IN NEOPRENE)          Medications:       ? carvediloL (COREG) 6.25 mg tablet Take one tablet by mouth twice daily.   ? ezetimibe (ZETIA) 10 mg tablet Take one tablet by mouth daily.   ? furosemide (LASIX) 20 mg tablet Take one tablet by mouth daily as needed.   ? GLUCOSAMINE HCL PO Take 1,000 mg by mouth twice daily.   ? lisinopril (ZESTRIL) 10 mg tablet Take one tablet by mouth daily.   ? meloxicam (MOBIC) 15 mg tablet Take one-half tablet to one tablet by mouth daily as needed. (Patient taking differently: Take 7.5-15 mg by mouth twice daily as needed.)   ? metFORMIN (GLUCOPHAGE) 1,000 mg tablet Take one tablet by mouth twice daily with meals.   ? pregabalin (LYRICA) 200 mg capsule Take one capsule by mouth twice daily.   ? rivaroxaban (XARELTO) 20 mg tablet Take one tablet by mouth daily with dinner.   ? rosuvastatin (CRESTOR) 20 mg tablet Take one tablet by mouth daily.   ? SAW PALMETTO PO Take  by mouth.   ? spironolactone (ALDACTONE) 25 mg tablet Take one tablet by mouth daily.   ? tiZANidine (ZANAFLEX) 2 mg tablet Take one tablet by mouth every 8 hours. (Patient taking differently: Take 2 mg by mouth twice daily.)   ? traMADoL (ULTRAM) 50 mg tablet Take one tablet by mouth every 8 hours as needed.  Creatinine:     Comments:

## 2021-01-04 ENCOUNTER — Ambulatory Visit: Admit: 2021-01-04 | Discharge: 2021-01-04 | Payer: MEDICARE

## 2021-01-04 ENCOUNTER — Encounter: Admit: 2021-01-04 | Discharge: 2021-01-04 | Payer: MEDICARE

## 2021-01-04 DIAGNOSIS — I872 Venous insufficiency (chronic) (peripheral): Secondary | ICD-10-CM

## 2021-01-10 ENCOUNTER — Encounter: Admit: 2021-01-10 | Discharge: 2021-01-10 | Payer: MEDICARE

## 2021-01-10 ENCOUNTER — Ambulatory Visit: Admit: 2021-01-10 | Discharge: 2021-01-10 | Payer: MEDICARE

## 2021-01-10 DIAGNOSIS — I428 Other cardiomyopathies: Secondary | ICD-10-CM

## 2021-01-10 DIAGNOSIS — I2699 Other pulmonary embolism without acute cor pulmonale: Secondary | ICD-10-CM

## 2021-01-10 DIAGNOSIS — K760 Fatty (change of) liver, not elsewhere classified: Secondary | ICD-10-CM

## 2021-01-10 DIAGNOSIS — E785 Hyperlipidemia, unspecified: Secondary | ICD-10-CM

## 2021-01-10 DIAGNOSIS — L98499 Non-pressure chronic ulcer of skin of other sites with unspecified severity: Secondary | ICD-10-CM

## 2021-01-10 DIAGNOSIS — M5416 Radiculopathy, lumbar region: Secondary | ICD-10-CM

## 2021-01-10 DIAGNOSIS — I7121 Ascending aortic aneurysm: Secondary | ICD-10-CM

## 2021-01-10 DIAGNOSIS — M1990 Unspecified osteoarthritis, unspecified site: Secondary | ICD-10-CM

## 2021-01-10 DIAGNOSIS — G609 Hereditary and idiopathic neuropathy, unspecified: Secondary | ICD-10-CM

## 2021-01-10 DIAGNOSIS — R609 Edema, unspecified: Secondary | ICD-10-CM

## 2021-01-10 DIAGNOSIS — I10 Essential (primary) hypertension: Secondary | ICD-10-CM

## 2021-01-10 DIAGNOSIS — R768 Other specified abnormal immunological findings in serum: Secondary | ICD-10-CM

## 2021-01-10 DIAGNOSIS — N529 Male erectile dysfunction, unspecified: Secondary | ICD-10-CM

## 2021-01-10 DIAGNOSIS — I872 Venous insufficiency (chronic) (peripheral): Secondary | ICD-10-CM

## 2021-01-10 DIAGNOSIS — E669 Obesity, unspecified: Secondary | ICD-10-CM

## 2021-01-10 DIAGNOSIS — I351 Nonrheumatic aortic (valve) insufficiency: Secondary | ICD-10-CM

## 2021-01-10 DIAGNOSIS — I2510 Atherosclerotic heart disease of native coronary artery without angina pectoris: Secondary | ICD-10-CM

## 2021-01-10 DIAGNOSIS — I493 Ventricular premature depolarization: Secondary | ICD-10-CM

## 2021-01-17 ENCOUNTER — Encounter: Admit: 2021-01-17 | Discharge: 2021-01-17 | Payer: MEDICARE

## 2021-01-17 ENCOUNTER — Ambulatory Visit: Admit: 2021-01-17 | Discharge: 2021-01-17 | Payer: MEDICARE

## 2021-01-17 DIAGNOSIS — M1990 Unspecified osteoarthritis, unspecified site: Secondary | ICD-10-CM

## 2021-01-17 DIAGNOSIS — I7121 Ascending aortic aneurysm: Secondary | ICD-10-CM

## 2021-01-17 DIAGNOSIS — E785 Hyperlipidemia, unspecified: Secondary | ICD-10-CM

## 2021-01-17 DIAGNOSIS — J849 Interstitial pulmonary disease, unspecified: Secondary | ICD-10-CM

## 2021-01-17 DIAGNOSIS — N529 Male erectile dysfunction, unspecified: Secondary | ICD-10-CM

## 2021-01-17 DIAGNOSIS — G609 Hereditary and idiopathic neuropathy, unspecified: Secondary | ICD-10-CM

## 2021-01-17 DIAGNOSIS — K760 Fatty (change of) liver, not elsewhere classified: Secondary | ICD-10-CM

## 2021-01-17 DIAGNOSIS — M5416 Radiculopathy, lumbar region: Secondary | ICD-10-CM

## 2021-01-17 DIAGNOSIS — I2510 Atherosclerotic heart disease of native coronary artery without angina pectoris: Secondary | ICD-10-CM

## 2021-01-17 DIAGNOSIS — E669 Obesity, unspecified: Secondary | ICD-10-CM

## 2021-01-17 DIAGNOSIS — R768 Other specified abnormal immunological findings in serum: Secondary | ICD-10-CM

## 2021-01-17 DIAGNOSIS — I493 Ventricular premature depolarization: Secondary | ICD-10-CM

## 2021-01-17 DIAGNOSIS — I351 Nonrheumatic aortic (valve) insufficiency: Secondary | ICD-10-CM

## 2021-01-17 DIAGNOSIS — I10 Essential (primary) hypertension: Secondary | ICD-10-CM

## 2021-01-17 DIAGNOSIS — I2699 Other pulmonary embolism without acute cor pulmonale: Secondary | ICD-10-CM

## 2021-01-17 DIAGNOSIS — I428 Other cardiomyopathies: Secondary | ICD-10-CM

## 2021-01-17 NOTE — Patient Instructions
Clinic Visit Summary:     Next clinic visit follow up with Dr Hall recommended in 6 months with Pulmonary Function Tests, at any location.     Please contact Pulmonary Nurse Coordinator with signs and symptoms of worsening productive cough with thick secretions, blood in sputum, chest tightness/pain, shortness of breath, fever, chills, night sweats, or any questions or concerns.     ILD/Sarcoidosis Clinical Care Coordinators: Jamie Ludwig, RN [phone], Abigayle Wilinski, RN [phone], and Heath Farrar, RN [phone]    For refills on medications, please have your pharmacy fax a refill authorization request form to our office at Fax) [phone]. Please allow at least 3 business days for refill requests.   For urgent issues after business hours/weekends/holidays call [phone] and request for the pulmonary fellow to be paged. For scheduling questions/concerns, please call [phone].

## 2021-01-20 ENCOUNTER — Encounter: Admit: 2021-01-20 | Discharge: 2021-01-20 | Payer: MEDICARE

## 2021-01-20 DIAGNOSIS — I2699 Other pulmonary embolism without acute cor pulmonale: Secondary | ICD-10-CM

## 2021-01-20 DIAGNOSIS — I2510 Atherosclerotic heart disease of native coronary artery without angina pectoris: Secondary | ICD-10-CM

## 2021-01-20 DIAGNOSIS — I10 Essential (primary) hypertension: Secondary | ICD-10-CM

## 2021-01-20 DIAGNOSIS — I7121 Ascending aortic aneurysm: Secondary | ICD-10-CM

## 2021-01-20 DIAGNOSIS — E785 Hyperlipidemia, unspecified: Secondary | ICD-10-CM

## 2021-01-20 DIAGNOSIS — I493 Ventricular premature depolarization: Secondary | ICD-10-CM

## 2021-01-20 DIAGNOSIS — K760 Fatty (change of) liver, not elsewhere classified: Secondary | ICD-10-CM

## 2021-01-20 DIAGNOSIS — N529 Male erectile dysfunction, unspecified: Secondary | ICD-10-CM

## 2021-01-20 DIAGNOSIS — G609 Hereditary and idiopathic neuropathy, unspecified: Secondary | ICD-10-CM

## 2021-01-20 DIAGNOSIS — E669 Obesity, unspecified: Secondary | ICD-10-CM

## 2021-01-20 DIAGNOSIS — R768 Other specified abnormal immunological findings in serum: Secondary | ICD-10-CM

## 2021-01-20 DIAGNOSIS — M5416 Radiculopathy, lumbar region: Secondary | ICD-10-CM

## 2021-01-20 DIAGNOSIS — I428 Other cardiomyopathies: Secondary | ICD-10-CM

## 2021-01-20 DIAGNOSIS — M1990 Unspecified osteoarthritis, unspecified site: Secondary | ICD-10-CM

## 2021-01-20 DIAGNOSIS — I351 Nonrheumatic aortic (valve) insufficiency: Secondary | ICD-10-CM

## 2021-01-22 ENCOUNTER — Encounter: Admit: 2021-01-22 | Discharge: 2021-01-22 | Payer: MEDICARE

## 2021-01-25 ENCOUNTER — Encounter: Admit: 2021-01-25 | Discharge: 2021-01-25 | Payer: MEDICARE

## 2021-01-25 ENCOUNTER — Ambulatory Visit: Admit: 2021-01-25 | Discharge: 2021-01-25 | Payer: MEDICARE

## 2021-01-25 DIAGNOSIS — I428 Other cardiomyopathies: Secondary | ICD-10-CM

## 2021-01-25 DIAGNOSIS — I493 Ventricular premature depolarization: Secondary | ICD-10-CM

## 2021-01-25 DIAGNOSIS — K760 Fatty (change of) liver, not elsewhere classified: Secondary | ICD-10-CM

## 2021-01-25 DIAGNOSIS — I2699 Other pulmonary embolism without acute cor pulmonale: Secondary | ICD-10-CM

## 2021-01-25 DIAGNOSIS — M1990 Unspecified osteoarthritis, unspecified site: Secondary | ICD-10-CM

## 2021-01-25 DIAGNOSIS — I7121 Ascending aortic aneurysm: Secondary | ICD-10-CM

## 2021-01-25 DIAGNOSIS — N529 Male erectile dysfunction, unspecified: Secondary | ICD-10-CM

## 2021-01-25 DIAGNOSIS — E669 Obesity, unspecified: Secondary | ICD-10-CM

## 2021-01-25 DIAGNOSIS — I10 Essential (primary) hypertension: Secondary | ICD-10-CM

## 2021-01-25 DIAGNOSIS — R768 Other specified abnormal immunological findings in serum: Secondary | ICD-10-CM

## 2021-01-25 DIAGNOSIS — I351 Nonrheumatic aortic (valve) insufficiency: Secondary | ICD-10-CM

## 2021-01-25 DIAGNOSIS — I2510 Atherosclerotic heart disease of native coronary artery without angina pectoris: Secondary | ICD-10-CM

## 2021-01-25 DIAGNOSIS — G609 Hereditary and idiopathic neuropathy, unspecified: Secondary | ICD-10-CM

## 2021-01-25 DIAGNOSIS — E785 Hyperlipidemia, unspecified: Secondary | ICD-10-CM

## 2021-01-25 DIAGNOSIS — M5416 Radiculopathy, lumbar region: Secondary | ICD-10-CM

## 2021-01-25 MED ORDER — SPIRONOLACTONE 25 MG PO TAB
25 mg | ORAL_TABLET | Freq: Every day | ORAL | 3 refills | 46.00000 days | Status: AC
Start: 2021-01-25 — End: ?

## 2021-01-25 MED ORDER — LISINOPRIL 10 MG PO TAB
10 mg | ORAL_TABLET | Freq: Every day | ORAL | 3 refills | Status: AC
Start: 2021-01-25 — End: ?

## 2021-01-25 NOTE — Progress Notes
Date of Service: 01/25/2021    Victor Graham is a 70 y.o. male.       HPI     Victor Graham is a pleasant 70 year old gentleman, coming in for a follow-up regarding his mild nonischemic cardiomyopathy, mild to moderate coronary disease, essential hypertension, prior ascending aortic aneurysm repair and dyslipidemia.  ?  Exertional capacity is limited due to musculoskeletal reasons.  He does get short of breath on moderate activity but this is a chronic symptom.  He has had chronic swelling in the right lower extremity.  He denies any chest pains.    ?  The ascending aortic aneurysm repair many years ago.  The CT done in 2020 had shown a possible dissection flap originating from the ascending aortic graft.  Dr. Helen Graham of [facility] personally reviewed the CT and felt that there is nothing to worry about.?  ?  He is compliant with his medications.  Cholesterol profile in July this year had shown excellent control of LDL.     ?  His last stress test was in January 2020 and it revealed a small area of mixed injury and ischemia in the circumflex territory.  It was a low risk study and conservative treatment was pursued.      With history of lupus anticoagulant and prior PE-taking Xarelto chronically.  ?  He does not smoke.  ?         Vitals:    01/25/21 0849   BP: 104/66   BP Source: Arm, Left Upper   Pulse: 74   SpO2: 95%   O2 Device: None (Room air)   PainSc: Zero   Weight: 128.4 kg (283 lb)   Height: 177.8 cm (5' 10)     Body mass index is 40.61 kg/m?Marland Kitchen     Past Medical History  Patient Active Problem List    Diagnosis Date Noted   ? Leg swelling 01/12/2021   ? 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 Type 2 diabetes mellitus with diabetic polyneuropathy, with long-term current use of insulin (HCC) 10/24/2020   ? History of deep venous thrombosis 03/24/2019   ? 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 Lumbar radiculopathy 09/30/2015   ? 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     09/2015 Mild on prior assessments     ? 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 Started Rapaflo 8mg   01/26/14 - LUTS much improved. Continue Rapaflo 8mg   02/15/15 - stable LUTS      08/16/15- Minimal nocturia and no other symptoms. Interested in trialing discontinuation of his Rapaflo. He will call if his symptoms worsen and we can refill his medications over the phone.     08/14/16 - symptoms stable, PVR 64mL      ? Class 3 severe obesity due to excess calories with serious comorbidity and body mass index (BMI) of 40.0 to 44.9 in adult [facility]) 10/28/2013   ? 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 New finding compared to prior EF 45-50% in '09 (by echo at [facility]), LV gram from '09 reviewed- EF 45%     ? 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 Has been personally reviewed by Dr Victor Graham and no concerns     ? Essential hypertension 01/08/2008     09-17-14: Well controlled. Taking Lisinopril 10 mg daily. Coreg 25 mg 0.5 tablet BID  Well controlled     ? Dyslipidemia 01/08/2008     10-08-14: TC 164, HDL 35, TG 237. On Zocor 20 mg  07-07-14: TC 209, LDL 234, HDL 45, TG 234  11/14 TC 153, LDL 74, HDL 45, TG 172  1/14 good numbers  5/13 TC 152, LDL 76, HDL 39, TG 183  2/11 TC 147, LDL 81, HDL 42, TG 138  Panel checked in 6/10- TC 144, LDL 87, HDL 36, TG 131, on simva 40 qhs, intolerant of niacin     ? CAD (coronary artery disease) 01/08/2008     Mild to moderate, non-obstructive by angio in 10/09 done following an abnormal stress thallium            Review of Systems   Constitutional: Negative.   HENT: Negative.    Eyes: Negative.    Cardiovascular: Negative.    Respiratory: Negative. Endocrine: Negative.    Hematologic/Lymphatic: Negative.    Skin: Negative.    Musculoskeletal: Negative.    Gastrointestinal: Negative.    Genitourinary: Negative.    Neurological: Negative.    Psychiatric/Behavioral: Negative.    Allergic/Immunologic: Negative.        Physical Exam  General Appearance: well for age, appears comfortable  Skin: warm, no ulcers, no xanthomas  Eyes/lids: no conjunctival pallor, no arcus, no xanthelasma  Lips/oral mucosa: no cyanosis  Neck: neck veins flat  Carotids: normal upstroke, no bruit  Chest: normal appearance  Lungs: clear  Cardiac rhythm: regular rhythm & normal rate  Cardiac auscultation: Normal S1 & S2, no S3 or S4, 2/6 ESM base  Abdomen: soft, non tender, bowel sounds normal, no pulsations/bruits  Extremities: Chronic right  LE edema, compression stockings   Neurologic/psych: oriented, no gross motor deficits, normal gait, normal mood & affect     Cardiovascular Health Factors  Vitals BP Readings from Last 3 Encounters:   01/25/21 104/66   01/17/21 130/71   01/10/21 112/74     Wt Readings from Last 3 Encounters:   01/25/21 128.4 kg (283 lb)   01/17/21 126.6 kg (279 lb)   01/10/21 129.5 kg (285 lb 6.4 oz)     BMI Readings from Last 3 Encounters:   01/25/21 40.61 kg/m?   01/17/21 40.03 kg/m?   01/10/21 40.95 kg/m?      Smoking Social History     Tobacco Use   Smoking Status Former Smoker   ? Years: 20.00   ? Quit date: 08/24/1991   ? Years since quitting: 29.4   Smokeless Tobacco Never Used      Lipid Profile Cholesterol   Date Value Ref Range Status   10/24/2020 93 <200 MG/DL Final     HDL   Date Value Ref Range Status   10/24/2020 34 (L) >40 MG/DL Final     LDL   Date Value Ref Range Status   10/24/2020 43 <100 mg/dL Final     Triglycerides   Date Value Ref Range Status   10/24/2020 198 (H) <150 MG/DL Final      Blood Sugar Hemoglobin A1C   Date Value Ref Range Status   10/24/2020 6.9 (H) 4.0 - 6.0 % Final     Comment:     The ADA recommends  that most patients with type 1 and type 2 diabetes maintain   an A1c level <7%.       Glucose   Date Value Ref Range Status   10/24/2020 123 (H) 70 - 100 MG/DL Final   16/01/9603 540 (H) 70 - 100 MG/DL Final   98/02/9146 829 (H) 70 - 100 MG/DL Final     Glucose Fasting   Date Value Ref Range Status   09/01/2015 117 (H) 70 - 100 MG/DL Final     Glucose, POC   Date Value Ref Range Status   04/18/2017 98 70 - 100 MG/DL Final   56/21/3086 578 (H) 70 - 100 MG/DL Final   46/96/2952 96 70 - 100 MG/DL Final     Glucose POC   Date Value Ref Range Status   03/18/2019 99 65 - 110 mg/dL Final   84/13/2440 102 (A) 65 - 110 mg/dL Final   72/53/6644 034 65 - 110 mg/dL Final          Problems Addressed Today  Encounter Diagnoses   Name Primary?   ? Essential hypertension    ? Non-ischemic cardiomyopathy (HCC)        Assessment and Plan     In summary, Mr. Bjornson is a 70 year old gentleman with the following cardiac and related issues:  ??  1. History of mild nonischemic cardiomyopathy,?repeat echo showed an EF of 45%, currently euvolemic, on good medical treatment   ??????  2. Hypertension,? well?controlled  3. History of unprovoked PE,?with positive family history DVT?and positive lupus anticoagulant,?to be maintained on long-term Xarelto.   4. Dyslipidemia-? on Crestor with LDL of 43 when checked in July 2022  ?  5. PVCs-?asymptomatic, on?beta-blockers.???  6. History of ascending aortic aneurysm repair?in 2010.????  Residual dissection reported.  [facility] has looked at the CT scan imaging?and?there does not appear to be a cause for worry  7. DM2, followed by Dr. Abel Presto???  8. Mildly abnl stress test,  asymptomatic, on statins, beta-blockers  ?  ?  ?  It was a pleasure to see Mr. Barkley in the clinic today.???  ?           Current Medications (including today's revisions)  ? carvediloL (COREG) 6.25 mg tablet Take one tablet by mouth twice daily.   ? ezetimibe (ZETIA) 10 mg tablet Take one tablet by mouth daily.   ? furosemide (LASIX) 20 mg tablet Take one tablet by mouth daily as needed.   ? GLUCOSAMINE HCL PO Take 1,000 mg by mouth twice daily.   ? lisinopril (ZESTRIL) 10 mg tablet Take one tablet by mouth daily.   ? meloxicam (MOBIC) 15 mg tablet Take one-half tablet to one tablet by mouth daily as needed. (Patient taking differently: Take 7.5-15 mg by mouth twice daily as needed.)   ? metFORMIN (GLUCOPHAGE) 1,000 mg tablet Take one tablet by mouth twice daily with meals.   ? pregabalin (LYRICA) 200 mg capsule Take one capsule by mouth twice daily.   ? rivaroxaban (XARELTO) 20 mg tablet Take one tablet by mouth daily with dinner.   ? rosuvastatin (CRESTOR) 20 mg tablet Take one tablet by mouth daily.   ? SAW PALMETTO PO Take  by mouth.   ? spironolactone (ALDACTONE) 25 mg tablet Take one tablet by mouth daily.   ? tiZANidine (ZANAFLEX) 2 mg tablet Take one tablet by mouth every 8 hours. (Patient taking differently: Take 2 mg by mouth twice daily.)   ? traMADoL (ULTRAM) 50 mg tablet  Take one tablet by mouth every 8 hours as needed.

## 2021-01-25 NOTE — Patient Instructions
FOLLOW UP IN 6MONTHS PLEASE CALL [phone] TO SCHEDULE IN ABOUT ONE MONTH.

## 2021-02-02 ENCOUNTER — Encounter: Admit: 2021-02-02 | Discharge: 2021-02-02 | Payer: MEDICARE

## 2021-02-02 ENCOUNTER — Ambulatory Visit: Admit: 2021-02-02 | Discharge: 2021-02-02 | Payer: MEDICARE

## 2021-02-02 DIAGNOSIS — G609 Hereditary and idiopathic neuropathy, unspecified: Secondary | ICD-10-CM

## 2021-02-02 DIAGNOSIS — I493 Ventricular premature depolarization: Secondary | ICD-10-CM

## 2021-02-02 DIAGNOSIS — I10 Essential (primary) hypertension: Secondary | ICD-10-CM

## 2021-02-02 DIAGNOSIS — R768 Other specified abnormal immunological findings in serum: Secondary | ICD-10-CM

## 2021-02-02 DIAGNOSIS — G5603 Carpal tunnel syndrome, bilateral upper limbs: Secondary | ICD-10-CM

## 2021-02-02 DIAGNOSIS — I2699 Other pulmonary embolism without acute cor pulmonale: Secondary | ICD-10-CM

## 2021-02-02 DIAGNOSIS — E669 Obesity, unspecified: Secondary | ICD-10-CM

## 2021-02-02 DIAGNOSIS — M5416 Radiculopathy, lumbar region: Secondary | ICD-10-CM

## 2021-02-02 DIAGNOSIS — K760 Fatty (change of) liver, not elsewhere classified: Secondary | ICD-10-CM

## 2021-02-02 DIAGNOSIS — N529 Male erectile dysfunction, unspecified: Secondary | ICD-10-CM

## 2021-02-02 DIAGNOSIS — I2510 Atherosclerotic heart disease of native coronary artery without angina pectoris: Secondary | ICD-10-CM

## 2021-02-02 DIAGNOSIS — M1990 Unspecified osteoarthritis, unspecified site: Secondary | ICD-10-CM

## 2021-02-02 DIAGNOSIS — E1142 Type 2 diabetes mellitus with diabetic polyneuropathy: Secondary | ICD-10-CM

## 2021-02-02 DIAGNOSIS — I7121 Ascending aortic aneurysm: Secondary | ICD-10-CM

## 2021-02-02 DIAGNOSIS — E785 Hyperlipidemia, unspecified: Secondary | ICD-10-CM

## 2021-02-02 DIAGNOSIS — I351 Nonrheumatic aortic (valve) insufficiency: Secondary | ICD-10-CM

## 2021-02-02 DIAGNOSIS — Z794 Long term (current) use of insulin: Secondary | ICD-10-CM

## 2021-02-02 DIAGNOSIS — I428 Other cardiomyopathies: Secondary | ICD-10-CM

## 2021-02-02 NOTE — Progress Notes
INVITAE TTR ordered by Dr. Rickard Patience during office visit this morning. Order placed through [facility]. Specimen tube labeled with pt identifies. Phlebotomist obtained blood specimen,  placed in biohazard bag; placed in [place] then placed in [place] shipping materials provided by lab and placed in front to await courier.     [id number]

## 2021-02-06 ENCOUNTER — Encounter: Admit: 2021-02-06 | Discharge: 2021-02-06 | Payer: MEDICARE

## 2021-02-06 MED ORDER — CARVEDILOL 6.25 MG PO TAB
6.25 mg | ORAL_TABLET | Freq: Two times a day (BID) | ORAL | 1 refills | 90.00000 days | Status: AC
Start: 2021-02-06 — End: ?

## 2021-02-06 MED ORDER — CARVEDILOL 6.25 MG PO TAB
6.25 mg | ORAL_TABLET | Freq: Two times a day (BID) | ORAL | 1 refills | Status: CN
Start: 2021-02-06 — End: ?

## 2021-02-08 ENCOUNTER — Encounter: Admit: 2021-02-08 | Discharge: 2021-02-08 | Payer: MEDICARE

## 2021-02-08 DIAGNOSIS — Z9289 Personal history of other medical treatment: Secondary | ICD-10-CM

## 2021-02-27 ENCOUNTER — Encounter: Admit: 2021-02-27 | Discharge: 2021-02-27 | Payer: MEDICARE

## 2021-02-27 DIAGNOSIS — E1142 Type 2 diabetes mellitus with diabetic polyneuropathy: Secondary | ICD-10-CM

## 2021-03-08 ENCOUNTER — Encounter: Admit: 2021-03-08 | Discharge: 2021-03-08 | Payer: MEDICARE

## 2021-03-08 DIAGNOSIS — M7989 Other specified soft tissue disorders: Secondary | ICD-10-CM

## 2021-03-08 DIAGNOSIS — I872 Venous insufficiency (chronic) (peripheral): Secondary | ICD-10-CM

## 2021-03-08 DIAGNOSIS — L300 Nummular dermatitis: Secondary | ICD-10-CM

## 2021-03-13 ENCOUNTER — Encounter: Admit: 2021-03-13 | Discharge: 2021-03-13 | Payer: MEDICARE

## 2021-03-13 DIAGNOSIS — R600 Localized edema: Secondary | ICD-10-CM

## 2021-03-13 MED ORDER — FUROSEMIDE 20 MG PO TAB
20 mg | ORAL_TABLET | Freq: Every day | ORAL | 1 refills | 90.00000 days | Status: AC | PRN
Start: 2021-03-13 — End: ?

## 2021-03-26 ENCOUNTER — Encounter: Admit: 2021-03-26 | Discharge: 2021-03-26 | Payer: MEDICARE

## 2021-03-30 ENCOUNTER — Ambulatory Visit: Admit: 2021-03-30 | Discharge: 2021-03-31 | Payer: MEDICARE

## 2021-03-30 ENCOUNTER — Encounter: Admit: 2021-03-30 | Discharge: 2021-03-30 | Payer: MEDICARE

## 2021-03-30 DIAGNOSIS — R682 Dry mouth, unspecified: Secondary | ICD-10-CM

## 2021-03-30 DIAGNOSIS — K130 Diseases of lips: Secondary | ICD-10-CM

## 2021-03-30 MED ORDER — MUPIROCIN 2 % TP OINT
Freq: Three times a day (TID) | TOPICAL | 1 refills | 11.00000 days | Status: AC
Start: 2021-03-30 — End: ?

## 2021-03-30 NOTE — Progress Notes
Date of Service: 03/30/2021    Victor Graham is a 70 y.o. male.  DOB: 07/12/1950  MRN: [id number]     Subjective:             History of Present Illness    Obtained patient's verbal consent to treat them and their agreement to [facility] financial policy and NPP via this telehealth visit during the [facility] Emergency    Patient reports for the past 2-3 months having dry and irritated lips.  He reports at times he gets a break in the skin, particularly in the corners.  He is not aware of any particular changes in meds or exposures.  He has a chronic history of dry mouth due to him being a mouth breather when sleeping.  He reports since his lips have become dry he has noticed his mouth is more dry.      Medical History:   Diagnosis Date   ? Arthritis    ? Ascending aortic aneurysm 01/08/2008   ? Asymptomatic PVCs 03/11/2015    03/2015 per pt, has had them for a long time & does not feel them usually    ? CAD (coronary artery disease)    ? Dyslipidemia    ? Erectile dysfunction    ? Fatty liver    ? HLD (hyperlipidemia) 01/08/2008   ? Hypertension 01/08/2008   ? Idiopathic polyneuropathy    ? Lumbar radiculopathy    ? NICM (nonischemic cardiomyopathy) (HCC)    ? 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 Obesity    ? Positive ANA (antinuclear antibody) 02/03/2016   ? Positive ANA (antinuclear antibody) 02/03/2016   ? Pulmonary embolism [facility]) 2015 -- july 20th    takes xaralto anticoagulant -- treated at [facility]     Surgical History:   Procedure Laterality Date   ? FASCIOTOMY Right 1992   ? KNEE SURGERY Right 02/2007    Torn meniscus removed   ? HX LUMBAR FUSION  02/2008    L4-5   ? AAA REPAIR  06/2008   ? HX JOINT REPLACEMENT Right 06/2010   ? HERNIA REPAIR  01/2013    x2   ? KNEE REPLACEMENT Left 03/2013   ? CYST REMOVAL Left 06/2013    wrist   ? STIMULATOR IMPLANT  12/2014    Spinal stimulator [employer]   ? CYST REMOVAL Right 08/2017    wrist   ? SHOULDER SURGERY  08/2018    reversed arthroplasty   ? COLONOSCOPY DIAGNOSTIC WITH SPECIMEN COLLECTION BY BRUSHING/ WASHING - FLEXIBLE N/A 03/18/2019    Performed by Lenor Derrick, MD at [facility] OR   ? COLONOSCOPY WITH CONTROL OF BLEEDING N/A 03/18/2019    Performed by Lenor Derrick[facility] at [facility] OR   ? COLONOSCOPY WITH SUBMUCOSAL INJECTION N/A 03/18/2019    Performed by Lenor Derrick[facility] MD at [facility] OR   ? PR LAPAROSCOPY SURG RPR INITIAL INGUINAL HERNIA       Family History   Problem Relation Age of Onset   ? DVT Mother    ? Cancer Father    ? DVT Brother    ? Stroke Paternal Grandfather    ? Cancer Paternal Grandmother      Social History     Socioeconomic History   ? Marital status: Married   Tobacco Use   ? Smoking status: Former     Years: 20.00  Types: Cigarettes     Quit date: 08/24/1991     Years since quitting: 29.6   ? Smokeless tobacco: Never   Substance and Sexual Activity   ? Alcohol use: No     Alcohol/week: 0.0 standard drinks   ? Drug use: No   ? Sexual activity: Not Currently     Partners: Female   Other Topics Concern   ? Financial planner Yes     CommentPersonnel officer, [employer]                        Review of Systems   Constitutional: Negative for chills, fatigue and fever.   HENT:        Dry mouth and lips   Eyes: Negative.    Respiratory: Negative for cough and shortness of breath.    Cardiovascular: Negative for chest pain.   Gastrointestinal: Negative.    Endocrine: Negative.    Musculoskeletal: Negative.    Skin: Negative.    Allergic/Immunologic: Negative.    Neurological: Negative.    Hematological: Negative.    Psychiatric/Behavioral: Negative.          Objective:         ? carvediloL (COREG) 6.25 mg tablet Take one tablet by mouth twice daily.   ? ezetimibe (ZETIA) 10 mg tablet Take one tablet by mouth daily.   ? furosemide (LASIX) 20 mg tablet Take one tablet by mouth daily as needed.   ? GLUCOSAMINE HCL PO Take 1,000 mg by mouth twice daily.   ? lisinopril (ZESTRIL) 10 mg tablet Take one tablet by mouth daily.   ? meloxicam (MOBIC) 15 mg tablet Take one-half tablet to one tablet by mouth daily as needed. (Patient taking differently: Take 7.5-15 mg by mouth twice daily as needed.)   ? metFORMIN (GLUCOPHAGE) 1,000 mg tablet Take one tablet by mouth twice daily with meals.   ? mupirocin (CENTANY) 2 % topical ointment Apply  topically to affected area three times daily.   ? pregabalin (LYRICA) 200 mg capsule Take one capsule by mouth twice daily.   ? rivaroxaban (XARELTO) 20 mg tablet Take one tablet by mouth daily with dinner.   ? rosuvastatin (CRESTOR) 20 mg tablet Take one tablet by mouth daily.   ? SAW PALMETTO PO Take  by mouth.   ? spironolactone (ALDACTONE) 25 mg tablet Take one tablet by mouth daily.   ? tiZANidine (ZANAFLEX) 2 mg tablet Take one tablet by mouth every 8 hours. (Patient taking differently: Take 2 mg by mouth twice daily.)   ? traMADoL (ULTRAM) 50 mg tablet Take one tablet by mouth every 8 hours as needed.     There were no vitals filed for this visit.  There is no height or weight on file to calculate BMI.     Physical Exam  Vitals reviewed.   Constitutional:       Appearance: Normal appearance. He is normal weight.   HENT:      Head: Normocephalic and atraumatic.   Pulmonary:      Effort: Pulmonary effort is normal.   Neurological:      Mental Status: He is alert. Mental status is at baseline.   Psychiatric:         Mood and Affect: Mood normal.         Behavior: Behavior normal.              Assessment and Plan:  1. Angular cheilitis  2.  Dry mouth  - I have reviewed the patient's medical history in detail and updated the computerized patient record.  - I reviewed signs/symptoms to monitor for  - I reviewed otc treatment options including artificial saliva, barrier protection of his lips and moisturizers  - I reviewed with the patient their current medications and specifically any new medications prescribed at the time of this visit and we reviewed the expected benefits and potential side effects. All questions are answered to the patient's satisfaction.   - mupirocin (CENTANY) 2 % topical ointment; Apply  topically to affected area three times daily.  Dispense: 22 g; Refill: 1

## 2021-04-21 ENCOUNTER — Encounter: Admit: 2021-04-21 | Discharge: 2021-04-21 | Payer: MEDICARE

## 2021-04-23 ENCOUNTER — Encounter: Admit: 2021-04-23 | Discharge: 2021-04-23 | Payer: MEDICARE

## 2021-04-25 ENCOUNTER — Ambulatory Visit: Admit: 2021-04-25 | Discharge: 2021-04-25 | Payer: MEDICARE

## 2021-04-25 ENCOUNTER — Encounter: Admit: 2021-04-25 | Discharge: 2021-04-25 | Payer: MEDICARE

## 2021-04-25 DIAGNOSIS — M5416 Radiculopathy, lumbar region: Secondary | ICD-10-CM

## 2021-04-25 DIAGNOSIS — M5116 Intervertebral disc disorders with radiculopathy, lumbar region: Secondary | ICD-10-CM

## 2021-04-25 NOTE — Progress Notes
[facility] CLINIC NOTE       SUBJECTIVE: [name] presents for care regarding back and leg pain.  Pain is bilateral and intermittently sharp and dull.  Walking and standing exacerbates the pain.  Pain is improved with rest.  Tramadol 50 mg which he takes rarely plus pregabalin 200 mg twice per day are keeping the pain moderate. He self-discontinued Tizanidine as he does not think this is helpful.         Review of Systems: negative except that in HPI    Current Outpatient Medications:   ?  carvediloL (COREG) 6.25 mg tablet, Take one tablet by mouth twice daily., Disp: 180 tablet, Rfl: 1  ?  ezetimibe (ZETIA) 10 mg tablet, Take one tablet by mouth daily., Disp: 90 tablet, Rfl: 0  ?  furosemide (LASIX) 20 mg tablet, Take one tablet by mouth daily as needed., Disp: 90 tablet, Rfl: 1  ?  GLUCOSAMINE HCL PO, Take 1,000 mg by mouth twice daily., Disp: , Rfl:   ?  lisinopril (ZESTRIL) 10 mg tablet, Take one tablet by mouth daily., Disp: 90 tablet, Rfl: 3  ?  meloxicam (MOBIC) 15 mg tablet, Take one-half tablet to one tablet by mouth daily as needed. (Patient taking differently: Take 7.5-15 mg by mouth twice daily as needed.), Disp: 90 tablet, Rfl: 3  ?  metFORMIN (GLUCOPHAGE) 1,000 mg tablet, Take one tablet by mouth twice daily with meals., Disp: 180 tablet, Rfl: 3  ?  mupirocin (CENTANY) 2 % topical ointment, Apply  topically to affected area three times daily., Disp: 22 g, Rfl: 1  ?  pregabalin (LYRICA) 200 mg capsule, Take one capsule by mouth twice daily., Disp: 180 capsule, Rfl: 3  ?  rivaroxaban (XARELTO) 20 mg tablet, Take one tablet by mouth daily with dinner., Disp: 90 tablet, Rfl: 3  ?  rosuvastatin (CRESTOR) 20 mg tablet, Take one tablet by mouth daily., Disp: 90 tablet, Rfl: 1  ?  SAW PALMETTO PO, Take  by mouth., Disp: , Rfl:   ?  spironolactone (ALDACTONE) 25 mg tablet, Take one tablet by mouth daily., Disp: 90 tablet, Rfl: 3  ?  tiZANidine (ZANAFLEX) 2 mg tablet, Take one tablet by mouth every 8 hours. (Patient taking differently: Take 2 mg by mouth twice daily.), Disp: 90 tablet, Rfl: 11  ?  traMADoL (ULTRAM) 50 mg tablet, Take one tablet by mouth every 8 hours as needed., Disp: 90 tablet, Rfl: 2     Allergies   Allergen Reactions   ? Other [Unclassified Drug] BLISTERS     dissolving stiches in mouth after dental surgery caused blisters   ? Lipitor [Atorvastatin] FLUSHING (SKIN)   ? Niaspan [Niacin] FLUSHING (SKIN)   ? Rubber SEE COMMENTS     Neoprene rubber causes contact dermatitis (CAUSED BY THIOUREA IN NEOPRENE)     Physical Exam  Vitals:    04/25/21 1301   BP: 120/66   Pulse: 81   SpO2: 95%   PainSc: Three   Weight: 127.9 kg (282 lb)     Oswestry Total Score:: (P) 46  Pain Score: Three  Body mass index is 40.46 kg/m?Marland Kitchen  General: Alert and oriented, very pleasant male.   HEENT showed extraocular muscles were intact and no other abnormalities.  Unlabored breathing.  Regular rate and rhythm on CV exam.   At least antigravity strength in bilateral upper and lower extremities.    Sensation is intact to light touch and equal in the upper and lower extremities.  IMPRESSION:  1. Lumbar disc disease with radiculopathy    2. Lumbar radicular pain          PLAN: Will refill medications as described above and continue with home exercise plan as prescribed by physical therapy.  Follow-up in 6 months or sooner if needed.    Patient was seen, examined, and discussed with attending physician, Dr. Samara Deist, who agrees with assessment and plan.    Aurelio Brash MD  Interventional Spine & [facility] Medicine Fellow  PM&R    I was present for key elements of the history and physical and agree with the above note.  Thank you for the opportunity to participate in the care of Victor Graham, please do not hesitate to contact me with questions.

## 2021-04-28 ENCOUNTER — Encounter: Admit: 2021-04-28 | Discharge: 2021-04-28 | Payer: MEDICARE

## 2021-04-28 DIAGNOSIS — E785 Hyperlipidemia, unspecified: Secondary | ICD-10-CM

## 2021-04-28 MED ORDER — EZETIMIBE 10 MG PO TAB
10 mg | ORAL_TABLET | Freq: Every day | ORAL | 0 refills | Status: AC
Start: 2021-04-28 — End: ?

## 2021-04-28 NOTE — Telephone Encounter
LOV 03/30/2021    Last PE 10/24/2020    Last filled 01/23/2021

## 2021-05-01 ENCOUNTER — Encounter: Admit: 2021-05-01 | Discharge: 2021-05-01 | Payer: MEDICARE

## 2021-06-04 ENCOUNTER — Encounter: Admit: 2021-06-04 | Discharge: 2021-06-04 | Payer: MEDICARE

## 2021-06-04 MED ORDER — PREGABALIN 200 MG PO CAP
ORAL_CAPSULE | 3 refills
Start: 2021-06-04 — End: ?

## 2021-06-27 ENCOUNTER — Encounter: Admit: 2021-06-27 | Discharge: 2021-06-27 | Payer: MEDICARE

## 2021-07-06 ENCOUNTER — Encounter: Admit: 2021-07-06 | Discharge: 2021-07-06 | Payer: MEDICARE

## 2021-07-06 DIAGNOSIS — K760 Fatty (change of) liver, not elsewhere classified: Secondary | ICD-10-CM

## 2021-07-06 DIAGNOSIS — I351 Nonrheumatic aortic (valve) insufficiency: Secondary | ICD-10-CM

## 2021-07-06 DIAGNOSIS — R768 Other specified abnormal immunological findings in serum: Secondary | ICD-10-CM

## 2021-07-06 DIAGNOSIS — I2699 Other pulmonary embolism without acute cor pulmonale: Secondary | ICD-10-CM

## 2021-07-06 DIAGNOSIS — G609 Hereditary and idiopathic neuropathy, unspecified: Secondary | ICD-10-CM

## 2021-07-06 DIAGNOSIS — I7121 Ascending aortic aneurysm (HCC): Secondary | ICD-10-CM

## 2021-07-06 DIAGNOSIS — G5602 Carpal tunnel syndrome, left upper limb: Secondary | ICD-10-CM

## 2021-07-06 DIAGNOSIS — I428 Other cardiomyopathies: Secondary | ICD-10-CM

## 2021-07-06 DIAGNOSIS — I2510 Atherosclerotic heart disease of native coronary artery without angina pectoris: Secondary | ICD-10-CM

## 2021-07-06 DIAGNOSIS — M1990 Unspecified osteoarthritis, unspecified site: Secondary | ICD-10-CM

## 2021-07-06 DIAGNOSIS — E669 Obesity, unspecified: Secondary | ICD-10-CM

## 2021-07-06 DIAGNOSIS — N529 Male erectile dysfunction, unspecified: Secondary | ICD-10-CM

## 2021-07-06 DIAGNOSIS — M5416 Radiculopathy, lumbar region: Secondary | ICD-10-CM

## 2021-07-06 DIAGNOSIS — I10 Essential (primary) hypertension: Secondary | ICD-10-CM

## 2021-07-06 DIAGNOSIS — E785 Hyperlipidemia, unspecified: Secondary | ICD-10-CM

## 2021-07-06 DIAGNOSIS — I493 Ventricular premature depolarization: Secondary | ICD-10-CM

## 2021-07-11 ENCOUNTER — Ambulatory Visit: Admit: 2021-07-11 | Discharge: 2021-07-12 | Payer: MEDICARE

## 2021-07-11 ENCOUNTER — Encounter: Admit: 2021-07-11 | Discharge: 2021-07-11 | Payer: MEDICARE

## 2021-07-11 DIAGNOSIS — R768 Other specified abnormal immunological findings in serum: Secondary | ICD-10-CM

## 2021-07-11 DIAGNOSIS — K760 Fatty (change of) liver, not elsewhere classified: Secondary | ICD-10-CM

## 2021-07-11 DIAGNOSIS — E785 Hyperlipidemia, unspecified: Secondary | ICD-10-CM

## 2021-07-11 DIAGNOSIS — I2510 Atherosclerotic heart disease of native coronary artery without angina pectoris: Secondary | ICD-10-CM

## 2021-07-11 DIAGNOSIS — M5416 Radiculopathy, lumbar region: Secondary | ICD-10-CM

## 2021-07-11 DIAGNOSIS — I10 Essential (primary) hypertension: Secondary | ICD-10-CM

## 2021-07-11 DIAGNOSIS — I428 Other cardiomyopathies: Secondary | ICD-10-CM

## 2021-07-11 DIAGNOSIS — G609 Hereditary and idiopathic neuropathy, unspecified: Secondary | ICD-10-CM

## 2021-07-11 DIAGNOSIS — I493 Ventricular premature depolarization: Secondary | ICD-10-CM

## 2021-07-11 DIAGNOSIS — N529 Male erectile dysfunction, unspecified: Secondary | ICD-10-CM

## 2021-07-11 DIAGNOSIS — E669 Obesity, unspecified: Secondary | ICD-10-CM

## 2021-07-11 DIAGNOSIS — I7121 Ascending aortic aneurysm (HCC): Secondary | ICD-10-CM

## 2021-07-11 DIAGNOSIS — I351 Nonrheumatic aortic (valve) insufficiency: Secondary | ICD-10-CM

## 2021-07-11 DIAGNOSIS — G5602 Carpal tunnel syndrome, left upper limb: Secondary | ICD-10-CM

## 2021-07-11 DIAGNOSIS — I2699 Other pulmonary embolism without acute cor pulmonale: Secondary | ICD-10-CM

## 2021-07-11 DIAGNOSIS — M1990 Unspecified osteoarthritis, unspecified site: Secondary | ICD-10-CM

## 2021-07-11 NOTE — Progress Notes
Date of Service: 07/11/2021    Victor Graham is a 71 y.o. male.       HPI    Victor Graham is a pleasant?71 year old gentleman,?here for his semiannual follow-up for mild nonischemic cardiomyopathy, mild to moderate coronary disease, essential hypertension,?prior ascending aortic aneurysm repair?and dyslipidemia.  ?  The angiogram was done more than 10 years ago and it had shown mild nonobstructive coronary disease.  Stress test a few years ago that showed a small area of mix of infarct and ischemia in the inferolateral wall which is being managed medically.  He ambulates with the help of a cane.  He denies chest pain but does get short of breath on moderate activity, a chronic symptom.    The LV ejection fraction is mildly reduced with an EF of 45%.  This is a chronic finding.    He is planning to have his left wrist reoperated because of ongoing numbness and carpal tunnel symptoms in the left hand.  The surgery scheduled for 4/19. ?  ?  The?ascending aortic aneurysm repair many years ago.??The CT done in 2020 had shown a possible dissection flap originating from the ascending aortic graft. ?Dr. Helen Hashimoto?of [facility]?personally reviewed the CT and felt that there is nothing to worry about.?  ?  He is compliant with his medications. ?Cholesterol profile in July this year had shown excellent control of LDL. ??  ?  The Xarelto is being taken for his history of PE.?  ?  He does not smoke.  He has lost a few pounds.           Vitals:    07/11/21 0830   BP: 107/68   BP Source: Arm, Right Upper   Pulse: 71   SpO2: 96%   PainSc: Zero   Weight: 125.7 kg (277 lb 3.2 oz)   Height: 177.8 cm (5' 10)     Body mass index is 39.77 kg/m?Marland Kitchen     Past Medical History  Patient Active Problem List    Diagnosis Date Noted   ? Carpal tunnel syndrome of left wrist 07/06/2021     emg by Dr Lynnae [facility]hoff at [facility] shows severe median neuropathy     ? Leg swelling 01/12/2021   ? 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 Type 2 diabetes mellitus with diabetic polyneuropathy, with long-term current use of insulin (HCC) 10/24/2020   ? History of deep venous thrombosis 03/24/2019   ? 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 Lumbar radiculopathy 09/30/2015   ? 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     09/2015 Mild on prior assessments     ? 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 Started Rapaflo 8mg   01/26/14 - LUTS much improved. Continue Rapaflo 8mg   02/15/15 - stable LUTS      08/16/15- Minimal nocturia and no other symptoms. Interested in trialing discontinuation of his Rapaflo. He will call if his symptoms worsen and we can refill his medications over the phone.     08/14/16 - symptoms stable, PVR 64mL      ? Class 3 severe obesity due to excess calories with serious comorbidity and body mass index (BMI) of 40.0 to 44.9 in adult [facility]) 10/28/2013   ? 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 New finding compared to prior  EF 45-50% in '09 (by echo at [facility]), LV gram from '09 reviewed- EF 45%     ? 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 Has been personally reviewed by Dr Helen Hashimoto and no concerns     ? Essential hypertension 01/08/2008     09-17-14: Well controlled. Taking Lisinopril 10 mg daily. Coreg 25 mg 0.5 tablet BID  Well controlled     ? Dyslipidemia 01/08/2008     10-08-14: TC 164, HDL 35, TG 237. On Zocor 20 mg  07-07-14: TC 209, LDL 234, HDL 45, TG 234  11/14 TC 153, LDL 74, HDL 45, TG 172  1/14 good numbers  5/13 TC 152, LDL 76, HDL 39, TG 183  2/11 TC 147, LDL 81, HDL 42, TG 138  Panel checked in 6/10- TC 144, LDL 87, HDL 36, TG 131, on simva 40 qhs, intolerant of niacin     ? CAD (coronary artery disease) 01/08/2008     Mild to moderate, non-obstructive by angio in 10/09 done following an abnormal stress thallium            Review of Systems   Constitutional: Negative.   HENT: Negative.    Eyes: Negative.    Cardiovascular: Positive for irregular heartbeat.   Respiratory: Negative.    Endocrine: Negative.    Hematologic/Lymphatic: Negative.    Skin: Negative.    Gastrointestinal: Negative.    Genitourinary: Negative.    Neurological: Positive for dizziness.   Psychiatric/Behavioral: Negative.    Allergic/Immunologic: Negative.        Physical Exam    General Appearance: no acute distress  Skin: warm, moist, no ulcers  HEENT: unremarkable  Neck Veins: neck veins are flat, neck veins are not distended  Carotid Arteries: normal carotid upstroke bilaterally, no bruits  Chest Inspection: chest is normal in appearance  Auscultation/Percussion: lungs clear to auscultation, no rales, rhonchi, or wheezing  Cardiac Rhythm: regular rhythm and normal rate  Cardiac Auscultation: Normal S1 & S2, no S3 or S4, no rub  Murmurs: soft esm base   Extremities: trace lower extremity edema;  Abdominal Exam: soft, non-tender, no masses, bowel sounds normal  Liver & Spleen: no organomegaly  Neurologic Exam: neurological assessment grossly intact    EKG shows sinus rhythm with nonspecific ST-T changes  Cardiovascular Health Factors  Vitals BP Readings from Last 3 Encounters:   07/11/21 107/68   04/25/21 120/66   02/02/21 103/62     Wt Readings from Last 3 Encounters:   07/11/21 125.7 kg (277 lb 3.2 oz)   04/25/21 127.9 kg (282 lb)   02/02/21 127.9 kg (282 lb)     BMI Readings from Last 3 Encounters:   07/11/21 39.77 kg/m?   04/25/21 40.46 kg/m?   02/02/21 40.46 kg/m?      Smoking Social History     Tobacco Use   Smoking Status Former   ? Years: 20.00   ? Types: Cigarettes   ? Quit date: 08/24/1991   ? Years since quitting: 29.9   Smokeless Tobacco Never      Lipid Profile Cholesterol   Date Value Ref Range Status 10/24/2020 93 <200 MG/DL Final     HDL   Date Value Ref Range Status   10/24/2020 34 (L) >40 MG/DL Final  LDL   Date Value Ref Range Status   10/24/2020 43 <100 mg/dL Final     Triglycerides   Date Value Ref Range Status   10/24/2020 198 (H) <150 MG/DL Final      Blood Sugar Hemoglobin A1C   Date Value Ref Range Status   10/24/2020 6.9 (H) 4.0 - 6.0 % Final     Comment:     The ADA recommends that most patients with type 1 and type 2 diabetes maintain   an A1c level <7%.       Glucose   Date Value Ref Range Status   10/24/2020 123 (H) 70 - 100 MG/DL Final   16/01/9603 540 (H) 70 - 100 MG/DL Final   98/02/9146 829 (H) 70 - 100 MG/DL Final     Glucose Fasting   Date Value Ref Range Status   09/01/2015 117 (H) 70 - 100 MG/DL Final     Glucose, POC   Date Value Ref Range Status   04/18/2017 98 70 - 100 MG/DL Final   56/21/3086 578 (H) 70 - 100 MG/DL Final   46/96/2952 96 70 - 100 MG/DL Final     Glucose POC   Date Value Ref Range Status   03/18/2019 99 65 - 110 mg/dL Final   84/13/2440 102 (A) 65 - 110 mg/dL Final   72/53/6644 034 65 - 110 mg/dL Final          Problems Addressed Today  Encounter Diagnoses   Name Primary?   ? Essential hypertension Yes   ? Non-ischemic cardiomyopathy (HCC)        Assessment and Plan    ?In summary, Victor Graham is a?71 year old gentleman with the following cardiac and related issues:  ??  1. History of mild nonischemic cardiomyopathy,? EF of 45%, euvolemic, on good medical treatment ????????  2. Hypertension,?very well?controlled  3. History of unprovoked PE,?with positive family history DVT?and positive lupus anticoagulant,?to be maintained on long-term Xarelto.  Last dose of Xarelto can be taken on 4/16 in preparation for the/19 surgery.  Kidney function is normal.  4. Dyslipidemia-? on Crestor with excellent LDL control.    5. PVCs-feels occasional skips, no history of syncope, on?beta-blockers.???  6. History of ascending aortic aneurysm repair?in 2010.???  Residual dissection reported in 2020.  CTS had looked at the CT scan imaging?and?there was no cause for worry.  We will need periodic CT monitoring.  7. DM2,?followed by Dr.?Noland???  8. Mildly abnl stress test,? asymptomatic, on statins, beta-blockers and with no chest discomfort  ?    It was a pleasure to see Victor Graham in the clinic today.  Continued weight loss was encouraged.         Current Medications (including today's revisions)  ? carvediloL (COREG) 6.25 mg tablet Take one tablet by mouth twice daily.   ? ezetimibe (ZETIA) 10 mg tablet Take one tablet by mouth daily.   ? furosemide (LASIX) 20 mg tablet Take one tablet by mouth daily as needed.   ? GLUCOSAMINE HCL PO Take 1,000 mg by mouth twice daily.   ? lisinopril (ZESTRIL) 10 mg tablet Take one tablet by mouth daily.   ? meloxicam (MOBIC) 15 mg tablet Take one-half tablet to one tablet by mouth daily as needed. (Patient taking differently: Take one-half tablet to one tablet by mouth twice daily as needed.)   ? metFORMIN (GLUCOPHAGE) 1,000 mg tablet Take one tablet by mouth twice daily with meals.   ? pregabalin (LYRICA) 200 mg capsule TAKE  1 CAPSULE TWICE A DAY   ? rivaroxaban (XARELTO) 20 mg tablet Take one tablet by mouth daily with dinner.   ? rosuvastatin (CRESTOR) 20 mg tablet Take one tablet by mouth daily.   ? SAW PALMETTO PO Take  by mouth.   ? spironolactone (ALDACTONE) 25 mg tablet Take one tablet by mouth daily.   ? traMADoL (ULTRAM) 50 mg tablet Take one tablet by mouth every 8 hours as needed.

## 2021-07-12 DIAGNOSIS — I429 Cardiomyopathy, unspecified: Secondary | ICD-10-CM

## 2021-07-12 DIAGNOSIS — I10 Essential (primary) hypertension: Secondary | ICD-10-CM

## 2021-07-18 ENCOUNTER — Encounter: Admit: 2021-07-18 | Discharge: 2021-07-18 | Payer: MEDICARE

## 2021-07-18 ENCOUNTER — Ambulatory Visit: Admit: 2021-07-18 | Discharge: 2021-07-18 | Payer: MEDICARE

## 2021-07-18 DIAGNOSIS — G609 Hereditary and idiopathic neuropathy, unspecified: Secondary | ICD-10-CM

## 2021-07-18 DIAGNOSIS — I493 Ventricular premature depolarization: Secondary | ICD-10-CM

## 2021-07-18 DIAGNOSIS — I351 Nonrheumatic aortic (valve) insufficiency: Secondary | ICD-10-CM

## 2021-07-18 DIAGNOSIS — E785 Hyperlipidemia, unspecified: Secondary | ICD-10-CM

## 2021-07-18 DIAGNOSIS — J849 Interstitial pulmonary disease, unspecified: Secondary | ICD-10-CM

## 2021-07-18 DIAGNOSIS — G5602 Carpal tunnel syndrome, left upper limb: Secondary | ICD-10-CM

## 2021-07-18 DIAGNOSIS — J984 Other disorders of lung: Secondary | ICD-10-CM

## 2021-07-18 DIAGNOSIS — N529 Male erectile dysfunction, unspecified: Secondary | ICD-10-CM

## 2021-07-18 DIAGNOSIS — M1990 Unspecified osteoarthritis, unspecified site: Secondary | ICD-10-CM

## 2021-07-18 DIAGNOSIS — I10 Essential (primary) hypertension: Secondary | ICD-10-CM

## 2021-07-18 DIAGNOSIS — M5416 Radiculopathy, lumbar region: Secondary | ICD-10-CM

## 2021-07-18 DIAGNOSIS — K760 Fatty (change of) liver, not elsewhere classified: Secondary | ICD-10-CM

## 2021-07-18 DIAGNOSIS — I2510 Atherosclerotic heart disease of native coronary artery without angina pectoris: Secondary | ICD-10-CM

## 2021-07-18 DIAGNOSIS — I7121 Ascending aortic aneurysm (HCC): Secondary | ICD-10-CM

## 2021-07-18 DIAGNOSIS — I428 Other cardiomyopathies: Secondary | ICD-10-CM

## 2021-07-18 DIAGNOSIS — R768 Other specified abnormal immunological findings in serum: Secondary | ICD-10-CM

## 2021-07-18 DIAGNOSIS — I2699 Other pulmonary embolism without acute cor pulmonale: Secondary | ICD-10-CM

## 2021-07-18 DIAGNOSIS — E669 Obesity, unspecified: Secondary | ICD-10-CM

## 2021-07-18 NOTE — Patient Instructions
Clinic Visit Summary:     Next clinic visit follow up with Dr Hall recommended in 12 months with Pulmonary Function Tests, at any location.     Please contact Pulmonary Nurse Coordinator with signs and symptoms of worsening productive cough with thick secretions, blood in sputum, chest tightness/pain, shortness of breath, fever, chills, night sweats, or any questions or concerns.     ILD/Sarcoidosis Clinical Care Coordinators: Jamie Ludwig, RN [phone], Teshawn Moan, RN [phone], and Heath Farrar, RN [phone].    For refills on medications, please have your pharmacy fax a refill authorization request form to our office at Fax) [phone]. Please allow at least 3 business days for refill requests.   For urgent issues after business hours/weekends/holidays call [phone] and request for the pulmonary fellow to be paged. For scheduling questions/concerns, please call [phone].

## 2021-07-18 NOTE — Progress Notes
Date of Service: 07/18/2021    Subjective:             Victor Graham is a 71 y.o. male.    Breathing Problem      This is a 71 y.o. year old male who presents to the [facility] for further evaluation.    To summarize his history,     Victor Graham was referred to our clinic after he had abnormal imaging findings on a CT scan.  Overall, he notes that he does not have any significant respiratory symptoms.  He denies any notable shortness of breath.  He has a rare cough. He is not on any supplemental oxygen.    He was subsequently referred to the [facility] for further evaluation.    I screened him for autoimmune symptoms that might suggest an autoimmune featured interstitial lung disease.  He reports arthralgias, but denies any synovitis.  He reports significant dry mouth symptoms but denies other sicca symptoms.  He denies GERD symptoms, but reports occasional food sticking to suggest esophageal dysphagia.  He denies raynaud's phenomenon, sclerodactyly, or hyperkeratosis.  He denies any muscle weakness or tenderness. He denies any significant skin rashes or ulcerations.     I also screened him for environmental exposures that might suggest a chronic hypersensitivity pneumonitis.  He denies having birds in the home.  Denies the use of feather pillows or a down comforter.  He does reports that he participates in woodworking as a hobby and has routine exposure polyurethane or isocyanate, but states he wears a respirator when using chemicals or is around dust.  He does report a history of water damage in the home.  He had this remediatied and mold growth is not obvious.  He denies the use of a humidifier or hot tub.  He doesn't live near any industrial or agricultural facilities.  There are no obvious exposures to any other significant respiratory toxins.    Potential drug exposures were also reviewed to exclude a drug induced cause.  He denies the use of chronic nitrofurantoin, methotrexate or amiodarone.  He denies radiation exposure.     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  His prior ANA was markedly elevated.  He denies being diagnosed with an autoimmune disease.     _____________________________________________________________    Victor Graham continues to do well.  No respiratory symptoms.  PFTs remain stable.  We discussed spacing out to yearly visits.      Medical History:   Diagnosis Date   ? Arthritis    ? Ascending aortic aneurysm (HCC) 01/08/2008   ? Asymptomatic PVCs 03/11/2015    03/2015 per pt, has had them for a long time & does not feel them usually    ? CAD (coronary artery disease)    ? Carpal tunnel syndrome of left wrist     emg by Dr Lynnae Sandhoff at [facility] shows severe median neuropathy   ? Dyslipidemia    ? Erectile dysfunction    ? Fatty liver    ? HLD (hyperlipidemia) 01/08/2008   ? Hypertension 01/08/2008   ? Idiopathic polyneuropathy    ? Lumbar radiculopathy    ? NICM (nonischemic cardiomyopathy) (HCC)    ? 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 Obesity    ? Positive ANA (antinuclear antibody) 02/03/2016   ? Positive ANA (  antinuclear antibody) 02/03/2016   ? Pulmonary embolism [facility]) 2015 -- july 20th    takes xaralto anticoagulant -- treated at [place]       SOCIAL HISTORY:  Former Smoker. 36 pack years. He denies significant alcohol or illicit substances.    FAMILY HISTORY:  Pulmonary fibrosis or autoimmune diseases does not run in the family       Review of Systems  A full 14 point review of systems was performed and is as above or is unremarkable.      Objective:         ? carvediloL (COREG) 6.25 mg tablet Take one tablet by mouth twice daily.   ? ezetimibe (ZETIA) 10 mg tablet Take one tablet by mouth daily.   ? furosemide (LASIX) 20 mg tablet Take one tablet by mouth daily as needed.   ? GLUCOSAMINE HCL PO Take 1,000 mg by mouth twice daily.   ? lisinopril (ZESTRIL) 10 mg tablet Take one tablet by mouth daily.   ? meloxicam (MOBIC) 15 mg tablet Take one-half tablet to one tablet by mouth daily as needed. (Patient taking differently: Take one-half tablet to one tablet by mouth twice daily as needed.)   ? metFORMIN (GLUCOPHAGE) 1,000 mg tablet Take one tablet by mouth twice daily with meals.   ? pregabalin (LYRICA) 200 mg capsule TAKE 1 CAPSULE TWICE A DAY   ? rivaroxaban (XARELTO) 20 mg tablet Take one tablet by mouth daily with dinner.   ? rosuvastatin (CRESTOR) 20 mg tablet Take one tablet by mouth daily.   ? SAW PALMETTO PO Take  by mouth.   ? spironolactone (ALDACTONE) 25 mg tablet Take one tablet by mouth daily.   ? traMADoL (ULTRAM) 50 mg tablet Take one tablet by mouth every 8 hours as needed.     There were no vitals filed for this visit.  There is no height or weight on file to calculate BMI.     Physical Exam   GENERAL:  Alert and Pleasant, No Distress  HEENT: PERRL, EOMI  NECK: No Cervical or Supraclavicular Adenopathy, No Thyromegaly  CVS:  Regular Rate and Rhythm  LUNGS: Bibasilar crackles, No Wheezes  ABDOMEN: Soft, Nontender, No Hepatosplenomegaly  EXTREMITIES: No edema.  SKIN: No rashes. No ulcers.  NEURO: Grossly normal          REVIEW OF DATA:  Pulmonary Function Testing    FVC  FEV1  TLC  DLCO  07/18/2021 3.01, 76% 2.52, 84% 5.02, 75% 25.88, 103%  01/17/2021 2.90, 73% 2.46, 81% 5.12, 77% 23.47, 92%  08/23/2020 2.66, 67% 2.20, 72% 4.83, 73% 23.30, 92%  03/01/2020 2.96, 73% 2.50, 81% 4.91, 74% 22.15, 74%  07/07/2019 3.12, 78% 2.64, 86% 5.15, 77% 26.79, 104%  01/06/2019 3.04, 75% 2.53, 83% 5.17, 78% 21.85, 85%    Chest Imaging:  Chest CT:    Subpleural, basal reticulation compatible with interstitial lung   abnormality (ILA). These findings have been described in the asymptomatic   elderly population but may also be seen in early interstitial lung disease   such as usual interstitial pneumonia (UIP). Consider correlation with   pulmonary function tests and patient symptoms that may be associated with   interstitial lung disease.     Cardiac Data:  Echocardiogram:  Interpretation Summary     Rest Echo:   Overall left ventricular systolic function is normal. EF~ 55%  Abnormal septal motion  Valves appear structurally normal without signficant stenosis or regurgitation  No significant pericardial effusion  Normal ventricular chamber dimensions  Mild left and right atrial enlargement  Normal LV wall thickness  Normal diastolic function  Mild aortic root dilation  Estimated peak systolic PA pressure =  25 mmHg         Pathology Data:  None    Lab Data:  Labs:  Results for KAISEN, HUESMAN (MRN [id number]) as of 01/09/2019 10:10   Ref. Range 09/01/2015 07:52 03/30/2016 16:26 06/25/2016 09:34 03/20/2017 15:40 09/11/2017 14:51 01/06/2019 15:47   Alternaria Alternata Unknown      <2.0.Marland KitchenMarland Kitchen   Aspergillus Fumigatus IgG Unknown      16.7   Aureobasidium Pullulans Unknown      3.7   Micropolyspora Faeni Unknown      <2.0.Marland KitchenMarland Kitchen   Penicillium Notatum Unknown      13.9   Phoma Herbarum Latest Units: MCG/ML      2.6   Thermoactomyces Vulgaris Unknown      5.8   Trichoderma Viride Unknown      5.3   DNA Double Strand AB Latest Ref Range: <10 TITER    <10     ANA Screen Latest Ref Range: <80 TITER SEE TITER     SEE TITER   ANA Titer/Pattern Latest Ref Range: <80  > OR = 1280     320 (H)   Anti-Smith Latest Ref Range: <1.0 AI     <0.2    Anti-RNP Latest Ref Range: <1.0 AI     <0.2    Anti-SSA Latest Ref Range: <1.0 AI     <0.2 <0.2   Anti-SSB Latest Ref Range: <1.0 AI     <0.2 <0.2   Rheum Factor Screen Latest Ref Range: <25 IU/mL <20 <20    <10   Centromere Antibody Unknown  <0.2.Marland KitchenMarland Kitchen       Myeloperoxidase AB Latest Ref Range: <1.0 AI      <0.2   Serine Protease3 AB Latest Ref Range: <1.0 AI      <0.2   Complemnt C3 Latest Ref Range: 88 - 200 MG/DL    454.0     Complemnt C4 Latest Ref Range: 10 - 49 MG/DL    98.1     Jo 1 Antibody Latest Ref Range: <1.0 AI      <0.2   SCL70 Ab Latest Ref Range: <1.0 AI  <0.2...    <0.2   BETA-2 GLY 1 AB ICG Latest Ref Range: 0 - 20 SGU  1.7       BETA-2 GLY 1 AB IGM Latest Ref Range: 0 - 20 SMU  7.2       CCP IgG Antibody Latest Ref Range: <3.0 U/mL  <0.5    <0.5   Cardiolipin, IgG Latest Ref Range: <20.0 GPL/ML  1.2 <1.6      Cardiolipin, IgM Latest Ref Range: <20.0 MPL/ML  21.8 (H) 12.1      Dilute Russell Viper Venom Latest Ref Range: <1.3 RATIO  1.2       Hexagonal Lupus Anticoagulant Unknown  14 (H) 16 (H)      IgG Latest Ref Range: 762 - 1,488 MG/DL      191   IgM Latest Ref Range: 38 - 328 MG/DL      478   IgA Latest Ref Range: 70 - 390 MG/DL      295                Assessment and Plan:  This is a 71 y.o. 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    Mr. Nihart has had decline in his PFTs. We will repeat a CT chest to look for progression of interstitial changes.      PROBLEM LIST:  1.  Interstitial Lung Disease  2.  Interstitial Lung Abnormalities - UIP/IPF vs non-progressive finding found with age.  So far seem to be non-progressive  3.  Positive ANA  4.  History of VTE  5.  Mild restrictive lung disease    PLAN:  1.  Continue to monitor.  2.  Yearly PFTs  3.  Initiate ofev or esbriet if progression    Jazmon Kos S. Margo Aye, MD  Assistant Professor  Associate Director of the [facility]  Division of Pulmonary & Critical Care Medicine  [address]  [address]  [place], [place] [address]  Phone: [phone]  Fax: [phone]    Total time 30 minutes.  Estimated counseling time 15 minutes.  Counseled the patient regarding important aspects of disease education, including a review of specific imaging features that shape the differential diagnosis, as well as the proposed management plan as above.

## 2021-07-30 ENCOUNTER — Encounter: Admit: 2021-07-30 | Discharge: 2021-07-30 | Payer: MEDICARE

## 2021-08-14 ENCOUNTER — Encounter: Admit: 2021-08-14 | Discharge: 2021-08-14 | Payer: MEDICARE

## 2021-08-14 MED ORDER — CARVEDILOL 6.25 MG PO TAB
6.25 mg | ORAL_TABLET | Freq: Two times a day (BID) | ORAL | 3 refills | 90.00000 days | Status: AC
Start: 2021-08-14 — End: ?

## 2021-08-15 ENCOUNTER — Encounter: Admit: 2021-08-15 | Discharge: 2021-08-15 | Payer: MEDICARE

## 2021-08-15 NOTE — Telephone Encounter
Victor Graham with pre-surgery at [facility] called, requested most recent OV note, EKG, stress test and echocardiogram. Returned call, LVM requesting she call back to provide fax number.

## 2021-09-06 ENCOUNTER — Encounter: Admit: 2021-09-06 | Discharge: 2021-09-06 | Payer: MEDICARE

## 2021-09-07 ENCOUNTER — Encounter: Admit: 2021-09-07 | Discharge: 2021-09-07 | Payer: MEDICARE

## 2021-09-14 ENCOUNTER — Ambulatory Visit: Admit: 2021-09-14 | Discharge: 2021-09-14 | Payer: MEDICARE

## 2021-09-14 ENCOUNTER — Encounter: Admit: 2021-09-14 | Discharge: 2021-09-14 | Payer: MEDICARE

## 2021-09-14 DIAGNOSIS — N529 Male erectile dysfunction, unspecified: Secondary | ICD-10-CM

## 2021-09-14 DIAGNOSIS — I493 Ventricular premature depolarization: Secondary | ICD-10-CM

## 2021-09-14 DIAGNOSIS — G5602 Carpal tunnel syndrome, left upper limb: Secondary | ICD-10-CM

## 2021-09-14 DIAGNOSIS — I2699 Other pulmonary embolism without acute cor pulmonale: Secondary | ICD-10-CM

## 2021-09-14 DIAGNOSIS — M5416 Radiculopathy, lumbar region: Secondary | ICD-10-CM

## 2021-09-14 DIAGNOSIS — G609 Hereditary and idiopathic neuropathy, unspecified: Secondary | ICD-10-CM

## 2021-09-14 DIAGNOSIS — I351 Nonrheumatic aortic (valve) insufficiency: Secondary | ICD-10-CM

## 2021-09-14 DIAGNOSIS — K760 Fatty (change of) liver, not elsewhere classified: Secondary | ICD-10-CM

## 2021-09-14 DIAGNOSIS — I428 Other cardiomyopathies: Secondary | ICD-10-CM

## 2021-09-14 DIAGNOSIS — M1990 Unspecified osteoarthritis, unspecified site: Secondary | ICD-10-CM

## 2021-09-14 DIAGNOSIS — E669 Obesity, unspecified: Secondary | ICD-10-CM

## 2021-09-14 DIAGNOSIS — I2510 Atherosclerotic heart disease of native coronary artery without angina pectoris: Secondary | ICD-10-CM

## 2021-09-14 DIAGNOSIS — I10 Essential (primary) hypertension: Secondary | ICD-10-CM

## 2021-09-14 DIAGNOSIS — E785 Hyperlipidemia, unspecified: Secondary | ICD-10-CM

## 2021-09-14 DIAGNOSIS — R768 Other specified abnormal immunological findings in serum: Secondary | ICD-10-CM

## 2021-09-14 DIAGNOSIS — I7121 Ascending aortic aneurysm (HCC): Secondary | ICD-10-CM

## 2021-09-14 NOTE — Progress Notes
Date of Service: 09/14/2021    Subjective:             Victor Graham is a 71 y.o. male.    History of Present Illness  Victor Graham is here for follow up of his diabetic neuropathy. He is followed in pain management clinic for his back pain and lumbar radiculopathy. He reports of pain in the low back. Spinal cord stimulator is not helping. He reports of numbness and recently feeling a pop in the back with twisting his body. Neuropathy symptoms are same as before without any change in numbness. Hemoglobin A1C is 6.9 according to the patient. Lost some weight. He is trying diet to control weight. Avoiding sugar. He quit smoking and drinking. He is presently on Pregabalin 200mg  bid    Allergies   Allergen Reactions   ? Atorvastatin FLUSHING (SKIN), SEE COMMENTS and HIVES     rash     ? Niacin FLUSHING (SKIN) and SEE COMMENTS     Other reaction(s): Red flush, itching   ? Other [Unclassified Drug] BLISTERS     dissolving stiches in mouth after dental surgery caused blisters   ? Rubber SEE COMMENTS     Neoprene rubber causes contact dermatitis (CAUSED BY THIOUREA IN NEOPRENE)            Review of Systems   Cardiovascular: Positive for leg swelling.   Musculoskeletal: Positive for arthralgias, back pain, gait problem, joint swelling and myalgias.   Neurological: Positive for numbness.   All other systems reviewed and are negative.        Objective:         ? carvediloL (COREG) 6.25 mg tablet Take one tablet by mouth twice daily.   ? ezetimibe (ZETIA) 10 mg tablet Take one tablet by mouth daily.   ? furosemide (LASIX) 20 mg tablet Take one tablet by mouth daily as needed.   ? GLUCOSAMINE HCL PO Take 1,000 mg by mouth twice daily.   ? lisinopril (ZESTRIL) 10 mg tablet Take one tablet by mouth daily.   ? meloxicam (MOBIC) 15 mg tablet Take one-half tablet to one tablet by mouth daily as needed. (Patient taking differently: Take one-half tablet to one tablet by mouth twice daily as needed.)   ? metFORMIN (GLUCOPHAGE) 1,000 mg tablet Take one tablet by mouth twice daily with meals.   ? pregabalin (LYRICA) 200 mg capsule Take one capsule by mouth twice daily.   ? rivaroxaban (XARELTO) 20 mg tablet Take one tablet by mouth daily with dinner.   ? rosuvastatin (CRESTOR) 20 mg tablet Take one tablet by mouth daily.   ? SAW PALMETTO PO Take  by mouth.   ? spironolactone (ALDACTONE) 25 mg tablet Take one tablet by mouth daily.   ? traMADoL (ULTRAM) 50 mg tablet Take one tablet by mouth every 8 hours as needed.     Vitals:    09/14/21 1130   BP: 127/80   BP Source: Arm, Left Upper   Pulse: 74   Temp: 36.8 ?C (98.3 ?F)   Resp: 18   TempSrc: Oral   PainSc: Zero   Weight: 126.6 kg (279 lb)   Height: 177.8 cm (5' 10)     Body mass index is 40.03 kg/m?Marland Kitchen     Physical Exam    Neurological examination:   Mental Status:?Alert, oriented to time, place and person   CN II thru XII:?Pupils 3mm, reactive to light and accommodation, intact extraocular movements, intact facial sensation, ?intact hearing,  symmetric palatal elevation, able to shrug shoulders equally on both sides, tongue midline.  ?  Palpebral Fissure 9/5mm   Orb Oculi 5/5   Orb Oris 5/5   Tongue? 5/5   ?  Speech:?No Dysarthria  Motor Exam:  ?  Neck flexors: 5   Neck extensors: 5   ?  ? Right Left ? Right Left   Shoulder abductors: 5 5 Hip flexion: 5 5   Elbow flexors: 5 5 Hip abduction: 5 5   Elbow extensors: 5 5 Hip Adduction: 5 5   Wrist extensors: 5 5 Knee extension: 5 5   Wrist flexors: 5 5 Knee flexion: 5 5   Finger flexors: 5 5 Ankle dorsiflexion: 4 5   Finger extensors: 5 5 Ankle plantar flexion: 4 5   Finger abductors: 5 5 Ankle eversion: 4 5   Thumb abductors: 5 5 Ankle inversion: 5 5   ? ? ? Toe extension: 1-2 4+   ? ? ? Toe flexion: 3 4-   ?scar on left hand from recent CTS surgery  No high arches or hammer toes    ?  Sensory:?Pinprick diminished up to knees bilaterally, absent vibratory sensations at the toes and  Proprioception decreased. ?Intact in upper extremities  Coordination: Normal finger to nose?bilaterally.  Reflexes:?  Reflex ? Right ? Left ?   Brachioradialis?? 1 1   Biceps?? ?1 1   Triceps 0 0   Knee 0 0   Ankle?? 1 w/ Jendrassik 0   Jaw Jerk ?   ?  ? Right ? Left ?   Plantar response mute mute   Hoffman?s ?absent absent   ?  Gait and Station:?wide-based antalgic?gait.?unable to heel walk, toe walk or tandem walk  [employer] gene testing performed on February 02, 2021 was negative when evaluating for TTR gene.       Assessment and Plan:  1.  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 Manson Passey and presently having the spinal cord stimulator and unable to get any MRIs due to this.  2 bilateral carpal tunnel syndrome status postsurgery on the left with recent  3.  Diabetic distal sensorimotor polyneuropathy, overall stable    PLAN:  1) Pregabalin 200 mg bid  2) Tramadol 25mg  po prn as needed   3) control diet and exercises  4) Encouraged use of AFO  5) RTC in 51months,earlier if symptoms progress  6) Control blood sugars  7) Pain management clinic followup    Bing Quarry, MD  Professor  Department of Neurology                    Total of 40 minutes were spent on the same day of the visit including preparing to see the patient, obtaining and/or reviewing separately obtained history, performing a medically appropriate examination and/or evaluation, counseling and educating the patient/family/caregiver, ordering medications, tests, or procedures, referring and communication with other health care professionals, documenting clinical information in the electronic or other health record, independently interpreting results and communicating results to the patient/family/caregiver, and care coordination

## 2021-10-02 ENCOUNTER — Encounter: Admit: 2021-10-02 | Discharge: 2021-10-02 | Payer: MEDICARE

## 2021-10-02 MED ORDER — ROSUVASTATIN 20 MG PO TAB
20 mg | ORAL_TABLET | Freq: Every day | ORAL | 0 refills | 90.00000 days | Status: AC
Start: 2021-10-02 — End: ?

## 2021-10-17 ENCOUNTER — Encounter: Admit: 2021-10-17 | Discharge: 2021-10-17 | Payer: MEDICARE

## 2021-10-27 ENCOUNTER — Encounter: Admit: 2021-10-27 | Discharge: 2021-10-27 | Payer: MEDICARE

## 2021-10-30 ENCOUNTER — Encounter: Admit: 2021-10-30 | Discharge: 2021-10-30 | Payer: MEDICARE

## 2021-10-30 ENCOUNTER — Ambulatory Visit: Admit: 2021-10-30 | Discharge: 2021-10-30 | Payer: MEDICARE

## 2021-10-30 ENCOUNTER — Ambulatory Visit: Admit: 2021-10-30 | Discharge: 2021-10-31 | Payer: MEDICARE

## 2021-10-30 DIAGNOSIS — I2699 Other pulmonary embolism without acute cor pulmonale: Secondary | ICD-10-CM

## 2021-10-30 DIAGNOSIS — M1990 Unspecified osteoarthritis, unspecified site: Secondary | ICD-10-CM

## 2021-10-30 DIAGNOSIS — E1165 Type 2 diabetes mellitus with hyperglycemia: Secondary | ICD-10-CM

## 2021-10-30 DIAGNOSIS — I10 Essential (primary) hypertension: Secondary | ICD-10-CM

## 2021-10-30 DIAGNOSIS — I351 Nonrheumatic aortic (valve) insufficiency: Secondary | ICD-10-CM

## 2021-10-30 DIAGNOSIS — E785 Hyperlipidemia, unspecified: Secondary | ICD-10-CM

## 2021-10-30 DIAGNOSIS — R768 Other specified abnormal immunological findings in serum: Secondary | ICD-10-CM

## 2021-10-30 DIAGNOSIS — G5602 Carpal tunnel syndrome, left upper limb: Secondary | ICD-10-CM

## 2021-10-30 DIAGNOSIS — E669 Obesity, unspecified: Secondary | ICD-10-CM

## 2021-10-30 DIAGNOSIS — I428 Other cardiomyopathies: Secondary | ICD-10-CM

## 2021-10-30 DIAGNOSIS — I83008 Varicose veins of unspecified lower extremity with ulcer other part of lower leg: Secondary | ICD-10-CM

## 2021-10-30 DIAGNOSIS — Z23 Encounter for immunization: Secondary | ICD-10-CM

## 2021-10-30 DIAGNOSIS — N529 Male erectile dysfunction, unspecified: Secondary | ICD-10-CM

## 2021-10-30 DIAGNOSIS — G609 Hereditary and idiopathic neuropathy, unspecified: Secondary | ICD-10-CM

## 2021-10-30 DIAGNOSIS — Z0000 Encounter for general adult medical examination without abnormal findings: Secondary | ICD-10-CM

## 2021-10-30 DIAGNOSIS — L98499 Non-pressure chronic ulcer of skin of other sites with unspecified severity: Secondary | ICD-10-CM

## 2021-10-30 DIAGNOSIS — K760 Fatty (change of) liver, not elsewhere classified: Secondary | ICD-10-CM

## 2021-10-30 DIAGNOSIS — M5416 Radiculopathy, lumbar region: Secondary | ICD-10-CM

## 2021-10-30 DIAGNOSIS — Z86718 Personal history of other venous thrombosis and embolism: Secondary | ICD-10-CM

## 2021-10-30 DIAGNOSIS — I493 Ventricular premature depolarization: Secondary | ICD-10-CM

## 2021-10-30 DIAGNOSIS — E1142 Type 2 diabetes mellitus with diabetic polyneuropathy: Secondary | ICD-10-CM

## 2021-10-30 DIAGNOSIS — I7121 Ascending aortic aneurysm (HCC): Secondary | ICD-10-CM

## 2021-10-30 DIAGNOSIS — I2510 Atherosclerotic heart disease of native coronary artery without angina pectoris: Secondary | ICD-10-CM

## 2021-10-30 LAB — COMPREHENSIVE METABOLIC PANEL
ALK PHOSPHATASE: 32 U/L (ref 25–110)
ALT: 52 U/L (ref 7–56)
ANION GAP: 11 K/UL (ref 3–12)
AST: 42 U/L — ABNORMAL HIGH (ref 7–40)
CHLORIDE: 102 MMOL/L (ref 98–110)
CO2: 24 MMOL/L (ref 21–30)
EGFR: >60 mL/min (ref >60)
GLUCOSE,PANEL: 169 mg/dL — ABNORMAL HIGH (ref 70–100)
POTASSIUM: 4.4 MMOL/L (ref 3.5–5.1)
SODIUM: 137 MMOL/L (ref 137–147)

## 2021-10-30 LAB — LIPID PROFILE
CHOLESTEROL: 106 mg/dL (ref <200)
LDL: 46 mg/dL (ref <100)
NON HDL CHOLESTEROL: 71 mg/dL (ref 3.5–5.0)
TRIGLYCERIDES: 220 mg/dL — ABNORMAL HIGH (ref <150)

## 2021-10-30 LAB — CBC AND DIFF
ABSOLUTE BASO COUNT: 0 K/UL (ref 0–0.20)
ABSOLUTE EOS COUNT: 0.2 K/UL (ref 0–0.45)
RBC COUNT: 4.9 M/UL (ref 4.4–5.5)
WBC COUNT: 5.6 K/UL (ref 4.5–11.0)

## 2021-10-30 LAB — TSH WITH FREE T4 REFLEX: TSH: 1.5 uU/mL — ABNORMAL HIGH (ref 0.35–5.00)

## 2021-10-30 LAB — PROSTATIC SPECIFIC ANTIGEN-PSA: PROSTATIC SPEC AG: 6.7 ng/mL — ABNORMAL HIGH (ref <6.01)

## 2021-10-30 MED ORDER — EZETIMIBE 10 MG PO TAB
10 mg | ORAL_TABLET | Freq: Every day | ORAL | 3 refills | Status: AC
Start: 2021-10-30 — End: ?

## 2021-10-30 MED ORDER — TRULICITY 0.75 MG/0.5 ML SC PNIJ
.75 mg | SUBCUTANEOUS | 3 refills | Status: AC
Start: 2021-10-30 — End: ?

## 2021-10-30 MED ORDER — TRULICITY 0.75 MG/0.5 ML SC PNIJ
.75 mg | SUBCUTANEOUS | 3 refills | Status: DC
Start: 2021-10-30 — End: 2021-10-30

## 2021-10-31 DIAGNOSIS — E785 Hyperlipidemia, unspecified: Secondary | ICD-10-CM

## 2021-10-31 DIAGNOSIS — E1142 Type 2 diabetes mellitus with diabetic polyneuropathy: Secondary | ICD-10-CM

## 2021-11-01 ENCOUNTER — Encounter: Admit: 2021-11-01 | Discharge: 2021-11-01 | Payer: MEDICARE

## 2021-11-01 DIAGNOSIS — R972 Elevated prostate specific antigen [PSA]: Principal | ICD-10-CM

## 2021-11-07 ENCOUNTER — Encounter: Admit: 2021-11-07 | Discharge: 2021-11-07 | Payer: MEDICARE

## 2021-11-07 ENCOUNTER — Ambulatory Visit: Admit: 2021-11-07 | Discharge: 2021-11-07 | Payer: MEDICARE

## 2021-11-07 DIAGNOSIS — R768 Other specified abnormal immunological findings in serum: Secondary | ICD-10-CM

## 2021-11-07 DIAGNOSIS — H269 Unspecified cataract: Secondary | ICD-10-CM

## 2021-11-07 DIAGNOSIS — I2699 Other pulmonary embolism without acute cor pulmonale: Secondary | ICD-10-CM

## 2021-11-07 DIAGNOSIS — E785 Hyperlipidemia, unspecified: Secondary | ICD-10-CM

## 2021-11-07 DIAGNOSIS — M5136 Other intervertebral disc degeneration, lumbar region: Secondary | ICD-10-CM

## 2021-11-07 DIAGNOSIS — I351 Nonrheumatic aortic (valve) insufficiency: Secondary | ICD-10-CM

## 2021-11-07 DIAGNOSIS — I10 Essential (primary) hypertension: Secondary | ICD-10-CM

## 2021-11-07 DIAGNOSIS — M5116 Intervertebral disc disorders with radiculopathy, lumbar region: Secondary | ICD-10-CM

## 2021-11-07 DIAGNOSIS — I493 Ventricular premature depolarization: Secondary | ICD-10-CM

## 2021-11-07 DIAGNOSIS — M4800 Spinal stenosis, site unspecified: Secondary | ICD-10-CM

## 2021-11-07 DIAGNOSIS — R011 Cardiac murmur, unspecified: Secondary | ICD-10-CM

## 2021-11-07 DIAGNOSIS — J189 Pneumonia, unspecified organism: Secondary | ICD-10-CM

## 2021-11-07 DIAGNOSIS — I428 Other cardiomyopathies: Secondary | ICD-10-CM

## 2021-11-07 DIAGNOSIS — G5602 Carpal tunnel syndrome, left upper limb: Secondary | ICD-10-CM

## 2021-11-07 DIAGNOSIS — M2550 Pain in unspecified joint: Secondary | ICD-10-CM

## 2021-11-07 DIAGNOSIS — E669 Obesity, unspecified: Secondary | ICD-10-CM

## 2021-11-07 DIAGNOSIS — K760 Fatty (change of) liver, not elsewhere classified: Secondary | ICD-10-CM

## 2021-11-07 DIAGNOSIS — M1990 Unspecified osteoarthritis, unspecified site: Secondary | ICD-10-CM

## 2021-11-07 DIAGNOSIS — M5416 Radiculopathy, lumbar region: Secondary | ICD-10-CM

## 2021-11-07 DIAGNOSIS — I2510 Atherosclerotic heart disease of native coronary artery without angina pectoris: Secondary | ICD-10-CM

## 2021-11-07 DIAGNOSIS — T148XXA Other injury of unspecified body region, initial encounter: Secondary | ICD-10-CM

## 2021-11-07 DIAGNOSIS — G971 Other reaction to spinal and lumbar puncture: Secondary | ICD-10-CM

## 2021-11-07 DIAGNOSIS — I7121 Ascending aortic aneurysm (HCC): Secondary | ICD-10-CM

## 2021-11-07 DIAGNOSIS — D689 Coagulation defect, unspecified: Secondary | ICD-10-CM

## 2021-11-07 DIAGNOSIS — N529 Male erectile dysfunction, unspecified: Secondary | ICD-10-CM

## 2021-11-07 DIAGNOSIS — G609 Hereditary and idiopathic neuropathy, unspecified: Secondary | ICD-10-CM

## 2021-11-07 NOTE — Patient Instructions
It was nice to see you today. Thank you for choosing to visit our clinic.      Your time is important and if you had to wait today, we do apologize. Our goal is to run exactly on time; however, on occasion, we get behind in clinic due to unexpected patient issues. Thank you for your patience.    General Instructions:  How to reach me: Please send a MyChart message to the [facility] or leave a voicemail for my nurse, Cara ([phone].  How to get a medication refill: Please use the MyChart Refill request or contact your pharmacy directly to request medication refills. Please allow 72 business hours for request to be completed.  How to receive your test results: If you have signed up for MyChart, you will receive your test results and messages from me this way. Otherwise, you will get a phone call or letter. If you are expecting results and have not heard from my office within 2 weeks of your testing, please send a MyChart message or call my office.  Scheduling: Our scheduling phone number is [phone].  Appointment Reminders on your cell phone: Communication preferences can be managed in MyChart to ensure you receive important appointment notifications  Support for many chronic illnesses is available through [facility]: [email or web address] or [phone].  For questions on nights, weekends or holidays, call the Operator at [phone], and ask for the doctor on call for [facility].      Again, thank you for coming in today.    ________________________________________________________________

## 2021-11-07 NOTE — Progress Notes
[facility] CLINIC NOTE       SUBJECTIVE: Victor Graham presents for care regarding back and lower extremity pain.  His pain is bilateral and intermittently sharp and dull.  Walking and standing exacerbates pain.  Pain is improved with rest.  For relief he takes Ultram 50 mg up to 3 times per day.  Spinal cord stimulator is not working with current settings and he has not been turning it on.  He is interested in MRI of his neck to evaluate progressing pain and weakness in neck and shoulder         Review of Systems    Current Outpatient Medications:   ?  carvediloL (COREG) 6.25 mg tablet, Take one tablet by mouth twice daily., Disp: 180 tablet, Rfl: 3  ?  dulaglutide (TRULICITY) 0.75 mg/0.5 mL injection pen, Inject 0.5 mL under the skin every 7 days., Disp: 2 mL, Rfl: 3  ?  ezetimibe (ZETIA) 10 mg tablet, Take one tablet by mouth daily., Disp: 90 tablet, Rfl: 3  ?  furosemide (LASIX) 20 mg tablet, Take one tablet by mouth daily as needed., Disp: 90 tablet, Rfl: 1  ?  GLUCOSAMINE HCL PO, Take 1,000 mg by mouth twice daily., Disp: , Rfl:   ?  lisinopril (ZESTRIL) 10 mg tablet, Take one tablet by mouth daily., Disp: 90 tablet, Rfl: 3  ?  meloxicam (MOBIC) 15 mg tablet, Take one-half tablet to one tablet by mouth daily as needed. (Patient taking differently: Take one-half tablet to one tablet by mouth twice daily as needed.), Disp: 90 tablet, Rfl: 3  ?  metFORMIN (GLUCOPHAGE) 1,000 mg tablet, Take one tablet by mouth twice daily with meals., Disp: 180 tablet, Rfl: 3  ?  pregabalin (LYRICA) 200 mg capsule, Take one capsule by mouth twice daily., Disp: 180 capsule, Rfl: 3  ?  rivaroxaban (XARELTO) 20 mg tablet, Take one tablet by mouth daily with dinner., Disp: 90 tablet, Rfl: 3  ?  rosuvastatin (CRESTOR) 20 mg tablet, Take one tablet by mouth daily., Disp: 90 tablet, Rfl: 0  ?  SAW PALMETTO PO, Take  by mouth., Disp: , Rfl:   ?  spironolactone (ALDACTONE) 25 mg tablet, Take one tablet by mouth daily., Disp: 90 tablet, Rfl: 3  ?  traMADoL (ULTRAM) 50 mg tablet, Take one tablet by mouth every 8 hours as needed., Disp: 90 tablet, Rfl: 2  ?  triamcinolone acetonide (KENALOG) 0.1 % topical ointment, , Disp: , Rfl:   Allergies   Allergen Reactions   ? Atorvastatin FLUSHING (SKIN), SEE COMMENTS and HIVES     rash     ? Niacin FLUSHING (SKIN) and SEE COMMENTS     Other reaction(s): Red flush, itching   ? Other [Unclassified Drug] BLISTERS     dissolving stiches in mouth after dental surgery caused blisters   ? Rubber SEE COMMENTS     Neoprene rubber causes contact dermatitis (CAUSED BY THIOUREA IN NEOPRENE)     Physical Exam  Vitals:    11/07/21 1530   BP: 131/74   Pulse: 78   SpO2: 95%   Weight: 123.4 kg (272 lb)   Height: 177.8 cm (5' 10)     Oswestry Total Score:: 48     Body mass index is 39.03 kg/m?Marland Kitchen  General: Alert and oriented, very pleasant male.   HEENT showed extraocular muscles were intact and no other abnormalities.  Unlabored breathing.  Regular rate and rhythm on CV exam.   5/5 strength in bilateral  upper and lower extremities.    Sensation is intact to light touch and equal in the upper and lower extremities.         IMPRESSION:  1. Lumbar disc disease with radiculopathy          PLAN: We will refill tramadol as described above plan spinal cord stimulator was adjusted in conjunction with a [employer] representative.  Follow-up in 3 months or sooner

## 2021-11-15 ENCOUNTER — Encounter: Admit: 2021-11-15 | Discharge: 2021-11-15 | Payer: MEDICARE

## 2021-11-15 ENCOUNTER — Ambulatory Visit: Admit: 2021-11-15 | Discharge: 2021-11-15 | Payer: MEDICARE

## 2021-11-15 DIAGNOSIS — R972 Elevated prostate specific antigen [PSA]: Secondary | ICD-10-CM

## 2021-11-15 DIAGNOSIS — E1142 Type 2 diabetes mellitus with diabetic polyneuropathy: Secondary | ICD-10-CM

## 2021-11-15 DIAGNOSIS — E1165 Type 2 diabetes mellitus with hyperglycemia: Secondary | ICD-10-CM

## 2021-11-15 DIAGNOSIS — Z0000 Encounter for general adult medical examination without abnormal findings: Secondary | ICD-10-CM

## 2021-11-15 LAB — PSA SCREEN: PSA SCREEN: 7.6 ng/mL — ABNORMAL HIGH (ref <6.01)

## 2021-11-15 LAB — MICROALB/CR RATIO-URINE RANDOM
MICROALBUMIN, RAN: 9.7 ug/mL
MICROALBUMIN/CR RATIO URINE: 8.2 ug/mg (ref <30)
UR CREATININE, RAN: 117 mg/dL

## 2021-11-16 ENCOUNTER — Encounter: Admit: 2021-11-16 | Discharge: 2021-11-16 | Payer: MEDICARE

## 2021-11-16 DIAGNOSIS — R972 Elevated prostate specific antigen [PSA]: Secondary | ICD-10-CM

## 2021-11-17 ENCOUNTER — Encounter: Admit: 2021-11-17 | Discharge: 2021-11-17 | Payer: MEDICARE

## 2021-11-18 ENCOUNTER — Encounter: Admit: 2021-11-18 | Discharge: 2021-11-18 | Payer: MEDICARE

## 2021-11-20 ENCOUNTER — Encounter: Admit: 2021-11-20 | Discharge: 2021-11-20 | Payer: MEDICARE

## 2021-11-22 ENCOUNTER — Encounter: Admit: 2021-11-22 | Discharge: 2021-11-22 | Payer: MEDICARE

## 2021-11-23 ENCOUNTER — Encounter: Admit: 2021-11-23 | Discharge: 2021-11-23 | Payer: MEDICARE

## 2021-11-24 ENCOUNTER — Encounter: Admit: 2021-11-24 | Discharge: 2021-11-24 | Payer: MEDICARE

## 2021-11-26 ENCOUNTER — Encounter: Admit: 2021-11-26 | Discharge: 2021-11-26 | Payer: MEDICARE

## 2021-11-27 ENCOUNTER — Encounter: Admit: 2021-11-27 | Discharge: 2021-11-27 | Payer: MEDICARE

## 2021-11-27 DIAGNOSIS — M5116 Intervertebral disc disorders with radiculopathy, lumbar region: Secondary | ICD-10-CM

## 2021-11-27 MED ORDER — RIVAROXABAN 20 MG PO TAB
20 mg | ORAL_TABLET | Freq: Every day | ORAL | 2 refills | 30.00000 days | Status: AC
Start: 2021-11-27 — End: ?

## 2021-11-29 ENCOUNTER — Ambulatory Visit: Admit: 2021-11-29 | Discharge: 2021-11-29 | Payer: MEDICARE

## 2021-11-29 DIAGNOSIS — M5116 Intervertebral disc disorders with radiculopathy, lumbar region: Secondary | ICD-10-CM

## 2021-12-04 ENCOUNTER — Encounter: Admit: 2021-12-04 | Discharge: 2021-12-04 | Payer: MEDICARE

## 2021-12-04 DIAGNOSIS — G609 Hereditary and idiopathic neuropathy, unspecified: Secondary | ICD-10-CM

## 2021-12-04 DIAGNOSIS — I493 Ventricular premature depolarization: Secondary | ICD-10-CM

## 2021-12-04 DIAGNOSIS — E669 Obesity, unspecified: Secondary | ICD-10-CM

## 2021-12-04 DIAGNOSIS — I7121 Ascending aortic aneurysm (HCC): Secondary | ICD-10-CM

## 2021-12-04 DIAGNOSIS — H269 Unspecified cataract: Secondary | ICD-10-CM

## 2021-12-04 DIAGNOSIS — N529 Male erectile dysfunction, unspecified: Secondary | ICD-10-CM

## 2021-12-04 DIAGNOSIS — D689 Coagulation defect, unspecified: Secondary | ICD-10-CM

## 2021-12-04 DIAGNOSIS — I2699 Other pulmonary embolism without acute cor pulmonale: Secondary | ICD-10-CM

## 2021-12-04 DIAGNOSIS — R768 Other specified abnormal immunological findings in serum: Secondary | ICD-10-CM

## 2021-12-04 DIAGNOSIS — E785 Hyperlipidemia, unspecified: Secondary | ICD-10-CM

## 2021-12-04 DIAGNOSIS — M4800 Spinal stenosis, site unspecified: Secondary | ICD-10-CM

## 2021-12-04 DIAGNOSIS — I10 Essential (primary) hypertension: Secondary | ICD-10-CM

## 2021-12-04 DIAGNOSIS — M2550 Pain in unspecified joint: Secondary | ICD-10-CM

## 2021-12-04 DIAGNOSIS — K760 Fatty (change of) liver, not elsewhere classified: Secondary | ICD-10-CM

## 2021-12-04 DIAGNOSIS — G5602 Carpal tunnel syndrome, left upper limb: Secondary | ICD-10-CM

## 2021-12-04 DIAGNOSIS — I428 Other cardiomyopathies: Secondary | ICD-10-CM

## 2021-12-04 DIAGNOSIS — I2510 Atherosclerotic heart disease of native coronary artery without angina pectoris: Secondary | ICD-10-CM

## 2021-12-04 DIAGNOSIS — M5416 Radiculopathy, lumbar region: Secondary | ICD-10-CM

## 2021-12-04 DIAGNOSIS — T148XXA Other injury of unspecified body region, initial encounter: Secondary | ICD-10-CM

## 2021-12-04 DIAGNOSIS — G971 Other reaction to spinal and lumbar puncture: Secondary | ICD-10-CM

## 2021-12-04 DIAGNOSIS — M1990 Unspecified osteoarthritis, unspecified site: Secondary | ICD-10-CM

## 2021-12-04 DIAGNOSIS — R011 Cardiac murmur, unspecified: Secondary | ICD-10-CM

## 2021-12-04 DIAGNOSIS — M5136 Other intervertebral disc degeneration, lumbar region: Secondary | ICD-10-CM

## 2021-12-04 DIAGNOSIS — J189 Pneumonia, unspecified organism: Secondary | ICD-10-CM

## 2021-12-04 DIAGNOSIS — I351 Nonrheumatic aortic (valve) insufficiency: Secondary | ICD-10-CM

## 2021-12-07 ENCOUNTER — Encounter: Admit: 2021-12-07 | Discharge: 2021-12-07 | Payer: MEDICARE

## 2021-12-07 ENCOUNTER — Ambulatory Visit: Admit: 2021-12-07 | Discharge: 2021-12-08 | Payer: MEDICARE

## 2021-12-07 DIAGNOSIS — S6992XD Unspecified injury of left wrist, hand and finger(s), subsequent encounter: Secondary | ICD-10-CM

## 2021-12-07 DIAGNOSIS — M4800 Spinal stenosis, site unspecified: Secondary | ICD-10-CM

## 2021-12-07 DIAGNOSIS — I2699 Other pulmonary embolism without acute cor pulmonale: Secondary | ICD-10-CM

## 2021-12-07 DIAGNOSIS — D689 Coagulation defect, unspecified: Secondary | ICD-10-CM

## 2021-12-07 DIAGNOSIS — H269 Unspecified cataract: Secondary | ICD-10-CM

## 2021-12-07 DIAGNOSIS — I7121 Ascending aortic aneurysm (HCC): Secondary | ICD-10-CM

## 2021-12-07 DIAGNOSIS — R768 Other specified abnormal immunological findings in serum: Secondary | ICD-10-CM

## 2021-12-07 DIAGNOSIS — N529 Male erectile dysfunction, unspecified: Secondary | ICD-10-CM

## 2021-12-07 DIAGNOSIS — G971 Other reaction to spinal and lumbar puncture: Secondary | ICD-10-CM

## 2021-12-07 DIAGNOSIS — I2510 Atherosclerotic heart disease of native coronary artery without angina pectoris: Secondary | ICD-10-CM

## 2021-12-07 DIAGNOSIS — E785 Hyperlipidemia, unspecified: Secondary | ICD-10-CM

## 2021-12-07 DIAGNOSIS — I493 Ventricular premature depolarization: Secondary | ICD-10-CM

## 2021-12-07 DIAGNOSIS — K760 Fatty (change of) liver, not elsewhere classified: Secondary | ICD-10-CM

## 2021-12-07 DIAGNOSIS — I351 Nonrheumatic aortic (valve) insufficiency: Secondary | ICD-10-CM

## 2021-12-07 DIAGNOSIS — M2550 Pain in unspecified joint: Secondary | ICD-10-CM

## 2021-12-07 DIAGNOSIS — E669 Obesity, unspecified: Secondary | ICD-10-CM

## 2021-12-07 DIAGNOSIS — G5602 Carpal tunnel syndrome, left upper limb: Secondary | ICD-10-CM

## 2021-12-07 DIAGNOSIS — I10 Essential (primary) hypertension: Secondary | ICD-10-CM

## 2021-12-07 DIAGNOSIS — T148XXA Other injury of unspecified body region, initial encounter: Secondary | ICD-10-CM

## 2021-12-07 DIAGNOSIS — I428 Other cardiomyopathies: Secondary | ICD-10-CM

## 2021-12-07 DIAGNOSIS — J189 Pneumonia, unspecified organism: Secondary | ICD-10-CM

## 2021-12-07 DIAGNOSIS — M5136 Other intervertebral disc degeneration, lumbar region: Secondary | ICD-10-CM

## 2021-12-07 DIAGNOSIS — R011 Cardiac murmur, unspecified: Secondary | ICD-10-CM

## 2021-12-07 DIAGNOSIS — M1990 Unspecified osteoarthritis, unspecified site: Secondary | ICD-10-CM

## 2021-12-07 DIAGNOSIS — G609 Hereditary and idiopathic neuropathy, unspecified: Secondary | ICD-10-CM

## 2021-12-07 DIAGNOSIS — M5416 Radiculopathy, lumbar region: Secondary | ICD-10-CM

## 2021-12-07 NOTE — Progress Notes
Date of Service: 12/07/2021    Victor Graham is a 71 y.o. male.  DOB: May 28, 1950  MRN: [id number]     Subjective:             History of Present Illness    Patient is here for ED f/u of a left index finger injury.  He reports he was using his table router at home and cut his left index finger.  He presented to [facility] ED and xrays were done showing a comminuted fracture of tuft of 2nd distal phalanx.  The overlying laceration is was glued and steristrips placed.  He is here today reporting his pain is improved and only scant amounts of oozing.  He has had good ROM and denies any fevers or chills.  He is wearing a small finger tip splint at night only.  He is currently on keflex 500mg  QID x 7 days.    Medical History:   Diagnosis Date   ? Arthritis    ? Ascending aortic aneurysm (HCC) 01/08/2008   ? Asymptomatic PVCs 03/11/2015    03/2015 per pt, has had them for a long time & does not feel them usually    ? Blood clotting disorder (HCC) 05 Apr 2017    hex lupus anticoagulant   ? CAD (coronary artery disease)    ? Carpal tunnel syndrome of left wrist     emg by Dr Lynnae Sandhoff at [facility] shows severe median neuropathy   ? Cataract     Cateract Surgery Dec 2022   ? Degenerative disc disease, lumbar    ? Dyslipidemia    ? Erectile dysfunction    ? Fatty liver    ? Heart murmur Nov 2020   ? HLD (hyperlipidemia) 01/08/2008   ? Hypertension 01/08/2008   ? Idiopathic polyneuropathy    ? Joint pain Jul 2008   ? Lumbar radiculopathy    ? Nerve injury    ? NICM (nonischemic cardiomyopathy) (HCC)    ? 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 Obesity    ? Pneumonia 2008   ? Positive ANA (antinuclear antibody) 02/03/2016   ? Positive ANA (antinuclear antibody) 02/03/2016   ? Pulmonary embolism [facility]) 2015 -- july 20th    takes xaralto anticoagulant -- treated at [facility]   ? Spinal headache May 1993   ? Spinal stenosis 04 Sep 2015     Surgical History:   Procedure Laterality Date   ? FASCIOTOMY Right 1992   ? KNEE SURGERY Right 02/2007    Torn meniscus removed   ? HX LUMBAR FUSION  02/2008    L4-5   ? AAA REPAIR  06/2008   ? HX JOINT REPLACEMENT Right 06/2010   ? HERNIA REPAIR  01/2013    x2   ? KNEE REPLACEMENT Left 03/2013   ? CYST REMOVAL Left 06/2013    wrist   ? STIMULATOR IMPLANT  12/2014    Spinal stimulator [employer]   ? CYST REMOVAL Right 08/2017    wrist   ? SHOULDER SURGERY  08/2018    reversed arthroplasty   ? COLONOSCOPY DIAGNOSTIC WITH SPECIMEN COLLECTION BY BRUSHING/ WASHING - FLEXIBLE N/A 03/18/2019    Performed by Lenor Derrick, MD at [facility] OR   ? COLONOSCOPY WITH CONTROL OF BLEEDING N/A 03/18/2019    Performed by Lenor Derrick, MD at [facility] OR   ? COLONOSCOPY WITH SUBMUCOSAL INJECTION N/A  03/18/2019    Performed by Lenor Derrick, MD at [facility] OR   ? HX ARTHROSCOPIC SURGERY     ? HX TONSILLECTOMY  1956   ? PR LAPAROSCOPY SURG RPR INITIAL INGUINAL HERNIA     ? UMBILICAL ARTERIAL CATH - BEDSIDE       Family History   Problem Relation Age of Onset   ? DVT Mother    ? Arthritis Mother    ? Joint Pain Mother    ? Cancer Father         Prostate cancer   ? DVT Brother    ? Stroke Paternal Grandfather    ? Cancer Paternal Grandmother    ? Arthritis Paternal Grandmother    ? Stroke Maternal Grandfather    ? Cancer Maternal Grandmother         Colon Cancer     Social History     Socioeconomic History   ? Marital status: Married   Tobacco Use   ? Smoking status: Former     Packs/day: 2.00     Years: 15.00     Additional pack years: 0.00     Total pack years: 30.00     Types: Cigarettes     Quit date: 08/24/1991     Years since quitting: 30.3   ? Smokeless tobacco: Never   Substance and Sexual Activity   ? Alcohol use: No   ? Drug use: No   ? Sexual activity: Not Currently     Partners: Female     Birth control/protection: None   Other Topics Concern   ? Financial planner Yes     CommentPersonnel officer, [employer]                        Review of Systems Musculoskeletal: Positive for arthralgias and joint swelling.   Skin: Positive for wound.         Objective:         ? carvediloL (COREG) 6.25 mg tablet Take one tablet by mouth twice daily.   ? cephalexin (KEFLEX) 500 mg capsule    ? dulaglutide (TRULICITY) 0.75 mg/0.5 mL injection pen Inject 0.5 mL under the skin every 7 days.   ? ezetimibe (ZETIA) 10 mg tablet Take one tablet by mouth daily.   ? furosemide (LASIX) 20 mg tablet Take one tablet by mouth daily as needed.   ? GLUCOSAMINE HCL PO Take 1,000 mg by mouth twice daily.   ? lisinopril (ZESTRIL) 10 mg tablet Take one tablet by mouth daily.   ? meloxicam (MOBIC) 15 mg tablet Take one-half tablet to one tablet by mouth daily as needed. (Patient taking differently: Take one-half tablet to one tablet by mouth twice daily as needed.)   ? metFORMIN (GLUCOPHAGE) 1,000 mg tablet Take one tablet by mouth twice daily with meals.   ? pregabalin (LYRICA) 200 mg capsule Take one capsule by mouth twice daily.   ? rivaroxaban (XARELTO) 20 mg tablet Take one tablet by mouth daily with dinner.   ? rosuvastatin (CRESTOR) 20 mg tablet Take one tablet by mouth daily.   ? SAW PALMETTO PO Take  by mouth.   ? spironolactone (ALDACTONE) 25 mg tablet Take one tablet by mouth daily.   ? traMADoL (ULTRAM) 50 mg tablet Take one tablet by mouth every 8 hours as needed.   ? triamcinolone acetonide (KENALOG) 0.1 % topical ointment      Vitals:    12/07/21 0657  BP: 94/66   BP Source: Arm, Right Upper   Pulse: 82   Temp: 37.3 ?C (99.2 ?F)   Resp: 14   SpO2: 94%   TempSrc: Temporal   PainSc: One   Weight: 122.5 kg (270 lb)  Comment: with shoes on     Body mass index is 38.74 kg/m?Marland Kitchen     Physical Exam  Vitals reviewed.   Constitutional:       Appearance: Normal appearance. He is well-developed.   HENT:      Head: Normocephalic and atraumatic.      Nose: Nose normal.   Eyes:      Conjunctiva/sclera: Conjunctivae normal.   Cardiovascular:      Rate and Rhythm: Normal rate.   Pulmonary: Effort: Pulmonary effort is normal.   Musculoskeletal:         General: Swelling and signs of injury present.        Hands:    Skin:     General: Skin is warm.      Capillary Refill: Capillary refill takes less than 2 seconds.      Findings: Bruising present. No rash.   Neurological:      Mental Status: He is alert. Mental status is at baseline.      Gait: Gait abnormal.   Psychiatric:         Behavior: Behavior normal.         Thought Content: Thought content normal.         Judgment: Judgment normal.              Assessment and Plan:  1. Injury of finger of left hand, subsequent encounter  - I have reviewed the patient's medical history in detail and updated the computerized patient record.  - I reviewed signs/symptoms to monitor for  - We reviewed importance of clean bandages and activity and completion of antibiotics.    - I spoke to Dr. Bertell Maria ([facility]) who agreed with plan and has agreed to see patient in f/u.   I appreciate his assistance in care of the patient.     - AMB REFERRAL TO HAND SURGEON

## 2021-12-14 ENCOUNTER — Encounter: Admit: 2021-12-14 | Discharge: 2021-12-14 | Payer: MEDICARE

## 2021-12-14 ENCOUNTER — Ambulatory Visit: Admit: 2021-12-14 | Discharge: 2021-12-15 | Payer: MEDICARE

## 2021-12-14 DIAGNOSIS — I2510 Atherosclerotic heart disease of native coronary artery without angina pectoris: Secondary | ICD-10-CM

## 2021-12-14 DIAGNOSIS — H269 Unspecified cataract: Secondary | ICD-10-CM

## 2021-12-14 DIAGNOSIS — S61211A Laceration without foreign body of left index finger without damage to nail, initial encounter: Secondary | ICD-10-CM

## 2021-12-14 DIAGNOSIS — I351 Nonrheumatic aortic (valve) insufficiency: Secondary | ICD-10-CM

## 2021-12-14 DIAGNOSIS — I428 Other cardiomyopathies: Secondary | ICD-10-CM

## 2021-12-14 DIAGNOSIS — E785 Hyperlipidemia, unspecified: Secondary | ICD-10-CM

## 2021-12-14 DIAGNOSIS — G609 Hereditary and idiopathic neuropathy, unspecified: Secondary | ICD-10-CM

## 2021-12-14 DIAGNOSIS — M2550 Pain in unspecified joint: Secondary | ICD-10-CM

## 2021-12-14 DIAGNOSIS — N529 Male erectile dysfunction, unspecified: Secondary | ICD-10-CM

## 2021-12-14 DIAGNOSIS — G5602 Carpal tunnel syndrome, left upper limb: Secondary | ICD-10-CM

## 2021-12-14 DIAGNOSIS — M5136 Other intervertebral disc degeneration, lumbar region: Secondary | ICD-10-CM

## 2021-12-14 DIAGNOSIS — G971 Other reaction to spinal and lumbar puncture: Secondary | ICD-10-CM

## 2021-12-14 DIAGNOSIS — I493 Ventricular premature depolarization: Secondary | ICD-10-CM

## 2021-12-14 DIAGNOSIS — M4800 Spinal stenosis, site unspecified: Secondary | ICD-10-CM

## 2021-12-14 DIAGNOSIS — R011 Cardiac murmur, unspecified: Secondary | ICD-10-CM

## 2021-12-14 DIAGNOSIS — K760 Fatty (change of) liver, not elsewhere classified: Secondary | ICD-10-CM

## 2021-12-14 DIAGNOSIS — E669 Obesity, unspecified: Secondary | ICD-10-CM

## 2021-12-14 DIAGNOSIS — M1990 Unspecified osteoarthritis, unspecified site: Secondary | ICD-10-CM

## 2021-12-14 DIAGNOSIS — D689 Coagulation defect, unspecified: Secondary | ICD-10-CM

## 2021-12-14 DIAGNOSIS — I7121 Ascending aortic aneurysm (HCC): Secondary | ICD-10-CM

## 2021-12-14 DIAGNOSIS — T148XXA Other injury of unspecified body region, initial encounter: Secondary | ICD-10-CM

## 2021-12-14 DIAGNOSIS — M5416 Radiculopathy, lumbar region: Secondary | ICD-10-CM

## 2021-12-14 DIAGNOSIS — I10 Essential (primary) hypertension: Secondary | ICD-10-CM

## 2021-12-14 DIAGNOSIS — J189 Pneumonia, unspecified organism: Secondary | ICD-10-CM

## 2021-12-14 DIAGNOSIS — R768 Other specified abnormal immunological findings in serum: Secondary | ICD-10-CM

## 2021-12-14 DIAGNOSIS — I2699 Other pulmonary embolism without acute cor pulmonale: Secondary | ICD-10-CM

## 2021-12-14 NOTE — Patient Instructions
It was a pleasure seeing you today. Please contact us if you have any further questions, we are happy to assist!     Image Scheduling (MRI/CT): [phone]  Radiology Injection Scheduling: [phone]  EMG Scheduling: [phone]  Physical Therapy Scheduling: [phone]    Please call Adrianna, our administrative assistant at [phone] for any work comp/administrative/disability paperwork questions.       Dr. Mitchell Birt - Orthopedic Surgeon, Sports Medicine  [facility] - Phone [phone] - Fax [phone]   [address], [address] - [place], [place] [address]    To schedule an appointment, please call our scheduling line at [phone]

## 2021-12-15 DIAGNOSIS — S6992XD Unspecified injury of left wrist, hand and finger(s), subsequent encounter: Secondary | ICD-10-CM

## 2021-12-22 ENCOUNTER — Encounter: Admit: 2021-12-22 | Discharge: 2021-12-22 | Payer: MEDICARE

## 2021-12-22 ENCOUNTER — Ambulatory Visit: Admit: 2021-12-22 | Discharge: 2021-12-22 | Payer: MEDICARE

## 2021-12-22 DIAGNOSIS — M5116 Intervertebral disc disorders with radiculopathy, lumbar region: Secondary | ICD-10-CM

## 2021-12-22 MED ORDER — LIDOCAINE (PF) 100 MG/5 ML (2 %) IV SYRG
INTRAVENOUS | 0 refills | Status: DC
Start: 2021-12-22 — End: 2021-12-22

## 2021-12-22 MED ORDER — PROPOFOL 10 MG/ML IV EMUL 50 ML (INFUSION)(AM)(OR)
INTRAVENOUS | 0 refills | Status: DC
Start: 2021-12-22 — End: 2021-12-22

## 2021-12-22 MED ORDER — MIDAZOLAM 1 MG/ML IJ SOLN
INTRAVENOUS | 0 refills | Status: DC
Start: 2021-12-22 — End: 2021-12-22

## 2021-12-22 MED ORDER — PHENYLEPHRINE HCL 10 MG/ML IJ SOLN
INTRAVENOUS | 0 refills | Status: DC
Start: 2021-12-22 — End: 2021-12-22

## 2021-12-22 MED ORDER — DEXMEDETOMIDINE IN 0.9 % NACL 80 MCG/20 ML (4 MCG/ML) IV SOLN
INTRAVENOUS | 0 refills | Status: DC
Start: 2021-12-22 — End: 2021-12-22

## 2021-12-22 MED ORDER — TRAMADOL 50 MG PO TAB
50 mg | ORAL_TABLET | ORAL | 2 refills | Status: AC | PRN
Start: 2021-12-22 — End: ?

## 2021-12-22 MED ORDER — FENTANYL CITRATE (PF) 50 MCG/ML IJ SOLN
INTRAVENOUS | 0 refills | Status: DC
Start: 2021-12-22 — End: 2021-12-22

## 2021-12-22 MED ORDER — CEFAZOLIN 1 GRAM IJ SOLR
INTRAVENOUS | 0 refills | Status: DC
Start: 2021-12-22 — End: 2021-12-22

## 2021-12-22 NOTE — Telephone Encounter
Pharmacy out of stock on medication. Routing new script to provider to sign off on at [facility] in [place].

## 2021-12-25 ENCOUNTER — Encounter: Admit: 2021-12-25 | Discharge: 2021-12-25 | Payer: MEDICARE

## 2021-12-25 DIAGNOSIS — E669 Obesity, unspecified: Secondary | ICD-10-CM

## 2021-12-25 DIAGNOSIS — M5416 Radiculopathy, lumbar region: Secondary | ICD-10-CM

## 2021-12-25 DIAGNOSIS — I10 Essential (primary) hypertension: Secondary | ICD-10-CM

## 2021-12-25 DIAGNOSIS — G5602 Carpal tunnel syndrome, left upper limb: Secondary | ICD-10-CM

## 2021-12-25 DIAGNOSIS — G971 Other reaction to spinal and lumbar puncture: Secondary | ICD-10-CM

## 2021-12-25 DIAGNOSIS — R011 Cardiac murmur, unspecified: Secondary | ICD-10-CM

## 2021-12-25 DIAGNOSIS — D689 Coagulation defect, unspecified: Secondary | ICD-10-CM

## 2021-12-25 DIAGNOSIS — G609 Hereditary and idiopathic neuropathy, unspecified: Secondary | ICD-10-CM

## 2021-12-25 DIAGNOSIS — I493 Ventricular premature depolarization: Secondary | ICD-10-CM

## 2021-12-25 DIAGNOSIS — J189 Pneumonia, unspecified organism: Secondary | ICD-10-CM

## 2021-12-25 DIAGNOSIS — I7121 Ascending aortic aneurysm (HCC): Secondary | ICD-10-CM

## 2021-12-25 DIAGNOSIS — R768 Other specified abnormal immunological findings in serum: Secondary | ICD-10-CM

## 2021-12-25 DIAGNOSIS — I351 Nonrheumatic aortic (valve) insufficiency: Secondary | ICD-10-CM

## 2021-12-25 DIAGNOSIS — I2510 Atherosclerotic heart disease of native coronary artery without angina pectoris: Secondary | ICD-10-CM

## 2021-12-25 DIAGNOSIS — M4800 Spinal stenosis, site unspecified: Secondary | ICD-10-CM

## 2021-12-25 DIAGNOSIS — I428 Other cardiomyopathies: Secondary | ICD-10-CM

## 2021-12-25 DIAGNOSIS — K760 Fatty (change of) liver, not elsewhere classified: Secondary | ICD-10-CM

## 2021-12-25 DIAGNOSIS — T148XXA Other injury of unspecified body region, initial encounter: Secondary | ICD-10-CM

## 2021-12-25 DIAGNOSIS — N529 Male erectile dysfunction, unspecified: Secondary | ICD-10-CM

## 2021-12-25 DIAGNOSIS — M2550 Pain in unspecified joint: Secondary | ICD-10-CM

## 2021-12-25 DIAGNOSIS — H269 Unspecified cataract: Secondary | ICD-10-CM

## 2021-12-25 DIAGNOSIS — M5136 Other intervertebral disc degeneration, lumbar region: Secondary | ICD-10-CM

## 2021-12-25 DIAGNOSIS — M1990 Unspecified osteoarthritis, unspecified site: Secondary | ICD-10-CM

## 2021-12-25 DIAGNOSIS — I2699 Other pulmonary embolism without acute cor pulmonale: Secondary | ICD-10-CM

## 2021-12-25 DIAGNOSIS — E785 Hyperlipidemia, unspecified: Secondary | ICD-10-CM

## 2021-12-26 ENCOUNTER — Encounter: Admit: 2021-12-26 | Discharge: 2021-12-26 | Payer: MEDICARE

## 2022-01-02 ENCOUNTER — Encounter: Admit: 2022-01-02 | Discharge: 2022-01-02 | Payer: MEDICARE

## 2022-01-02 ENCOUNTER — Ambulatory Visit: Admit: 2022-01-02 | Discharge: 2022-01-03 | Payer: MEDICARE

## 2022-01-02 DIAGNOSIS — E785 Hyperlipidemia, unspecified: Secondary | ICD-10-CM

## 2022-01-02 DIAGNOSIS — N529 Male erectile dysfunction, unspecified: Secondary | ICD-10-CM

## 2022-01-02 DIAGNOSIS — G5602 Carpal tunnel syndrome, left upper limb: Secondary | ICD-10-CM

## 2022-01-02 DIAGNOSIS — K760 Fatty (change of) liver, not elsewhere classified: Secondary | ICD-10-CM

## 2022-01-02 DIAGNOSIS — R768 Other specified abnormal immunological findings in serum: Secondary | ICD-10-CM

## 2022-01-02 DIAGNOSIS — G971 Other reaction to spinal and lumbar puncture: Secondary | ICD-10-CM

## 2022-01-02 DIAGNOSIS — M2550 Pain in unspecified joint: Secondary | ICD-10-CM

## 2022-01-02 DIAGNOSIS — H269 Unspecified cataract: Secondary | ICD-10-CM

## 2022-01-02 DIAGNOSIS — I2699 Other pulmonary embolism without acute cor pulmonale: Secondary | ICD-10-CM

## 2022-01-02 DIAGNOSIS — I7121 Ascending aortic aneurysm (HCC): Secondary | ICD-10-CM

## 2022-01-02 DIAGNOSIS — D689 Coagulation defect, unspecified: Secondary | ICD-10-CM

## 2022-01-02 DIAGNOSIS — G609 Hereditary and idiopathic neuropathy, unspecified: Secondary | ICD-10-CM

## 2022-01-02 DIAGNOSIS — I351 Nonrheumatic aortic (valve) insufficiency: Secondary | ICD-10-CM

## 2022-01-02 DIAGNOSIS — I428 Other cardiomyopathies: Secondary | ICD-10-CM

## 2022-01-02 DIAGNOSIS — J189 Pneumonia, unspecified organism: Secondary | ICD-10-CM

## 2022-01-02 DIAGNOSIS — M5136 Other intervertebral disc degeneration, lumbar region: Secondary | ICD-10-CM

## 2022-01-02 DIAGNOSIS — I493 Ventricular premature depolarization: Secondary | ICD-10-CM

## 2022-01-02 DIAGNOSIS — E669 Obesity, unspecified: Secondary | ICD-10-CM

## 2022-01-02 DIAGNOSIS — M4800 Spinal stenosis, site unspecified: Secondary | ICD-10-CM

## 2022-01-02 DIAGNOSIS — M5416 Radiculopathy, lumbar region: Secondary | ICD-10-CM

## 2022-01-02 DIAGNOSIS — T148XXA Other injury of unspecified body region, initial encounter: Secondary | ICD-10-CM

## 2022-01-02 DIAGNOSIS — M1990 Unspecified osteoarthritis, unspecified site: Secondary | ICD-10-CM

## 2022-01-02 DIAGNOSIS — I10 Essential (primary) hypertension: Secondary | ICD-10-CM

## 2022-01-02 DIAGNOSIS — I2510 Atherosclerotic heart disease of native coronary artery without angina pectoris: Secondary | ICD-10-CM

## 2022-01-02 DIAGNOSIS — R011 Cardiac murmur, unspecified: Secondary | ICD-10-CM

## 2022-01-09 ENCOUNTER — Encounter: Admit: 2022-01-09 | Discharge: 2022-01-09 | Payer: MEDICARE

## 2022-01-11 ENCOUNTER — Encounter: Admit: 2022-01-11 | Discharge: 2022-01-11 | Payer: MEDICARE

## 2022-01-16 ENCOUNTER — Encounter: Admit: 2022-01-16 | Discharge: 2022-01-16 | Payer: MEDICARE

## 2022-01-16 ENCOUNTER — Ambulatory Visit: Admit: 2022-01-16 | Discharge: 2022-01-16 | Payer: MEDICARE

## 2022-01-16 DIAGNOSIS — R002 Palpitations: Secondary | ICD-10-CM

## 2022-01-16 DIAGNOSIS — M1990 Unspecified osteoarthritis, unspecified site: Secondary | ICD-10-CM

## 2022-01-16 DIAGNOSIS — E785 Hyperlipidemia, unspecified: Secondary | ICD-10-CM

## 2022-01-16 DIAGNOSIS — G5602 Carpal tunnel syndrome, left upper limb: Secondary | ICD-10-CM

## 2022-01-16 DIAGNOSIS — I499 Cardiac arrhythmia, unspecified: Secondary | ICD-10-CM

## 2022-01-16 DIAGNOSIS — Z9889 Other specified postprocedural states: Secondary | ICD-10-CM

## 2022-01-16 DIAGNOSIS — I10 Essential (primary) hypertension: Secondary | ICD-10-CM

## 2022-01-16 DIAGNOSIS — I429 Cardiomyopathy, unspecified: Secondary | ICD-10-CM

## 2022-01-16 DIAGNOSIS — M2550 Pain in unspecified joint: Secondary | ICD-10-CM

## 2022-01-16 DIAGNOSIS — R011 Cardiac murmur, unspecified: Secondary | ICD-10-CM

## 2022-01-16 DIAGNOSIS — G609 Hereditary and idiopathic neuropathy, unspecified: Secondary | ICD-10-CM

## 2022-01-16 DIAGNOSIS — K760 Fatty (change of) liver, not elsewhere classified: Secondary | ICD-10-CM

## 2022-01-16 DIAGNOSIS — G971 Other reaction to spinal and lumbar puncture: Secondary | ICD-10-CM

## 2022-01-16 DIAGNOSIS — M4800 Spinal stenosis, site unspecified: Secondary | ICD-10-CM

## 2022-01-16 DIAGNOSIS — I2510 Atherosclerotic heart disease of native coronary artery without angina pectoris: Secondary | ICD-10-CM

## 2022-01-16 DIAGNOSIS — H269 Unspecified cataract: Secondary | ICD-10-CM

## 2022-01-16 DIAGNOSIS — I7121 Ascending aortic aneurysm (HCC): Secondary | ICD-10-CM

## 2022-01-16 DIAGNOSIS — M5416 Radiculopathy, lumbar region: Secondary | ICD-10-CM

## 2022-01-16 DIAGNOSIS — J189 Pneumonia, unspecified organism: Secondary | ICD-10-CM

## 2022-01-16 DIAGNOSIS — N529 Male erectile dysfunction, unspecified: Secondary | ICD-10-CM

## 2022-01-16 DIAGNOSIS — T148XXA Other injury of unspecified body region, initial encounter: Secondary | ICD-10-CM

## 2022-01-16 DIAGNOSIS — I493 Ventricular premature depolarization: Secondary | ICD-10-CM

## 2022-01-16 DIAGNOSIS — I2699 Other pulmonary embolism without acute cor pulmonale: Secondary | ICD-10-CM

## 2022-01-16 DIAGNOSIS — I351 Nonrheumatic aortic (valve) insufficiency: Secondary | ICD-10-CM

## 2022-01-16 DIAGNOSIS — M5136 Other intervertebral disc degeneration, lumbar region: Secondary | ICD-10-CM

## 2022-01-16 DIAGNOSIS — R768 Other specified abnormal immunological findings in serum: Secondary | ICD-10-CM

## 2022-01-16 DIAGNOSIS — I428 Other cardiomyopathies: Secondary | ICD-10-CM

## 2022-01-16 DIAGNOSIS — E669 Obesity, unspecified: Secondary | ICD-10-CM

## 2022-01-16 DIAGNOSIS — D689 Coagulation defect, unspecified: Secondary | ICD-10-CM

## 2022-01-16 MED ORDER — ROSUVASTATIN 20 MG PO TAB
20 mg | ORAL_TABLET | Freq: Every day | ORAL | 1 refills | 90.00000 days | Status: AC
Start: 2022-01-16 — End: ?

## 2022-01-16 MED ORDER — LISINOPRIL 10 MG PO TAB
10 mg | ORAL_TABLET | Freq: Every day | ORAL | 3 refills | Status: AC
Start: 2022-01-16 — End: ?

## 2022-01-16 NOTE — Telephone Encounter
Pt was last seen 12/07/2021. Last physical 10/30/2021.

## 2022-01-16 NOTE — Progress Notes
Date of Service: 01/16/2022    Victor Graham is a 71 y.o. male.       HPI    Victor Graham is a pleasant?71 year old gentleman,?coming in for continued care of his mild nonischemic cardiomyopathy, mild to moderate coronary disease, essential hypertension,?prior ascending aortic aneurysm repair?and dyslipidemia.  ?  The main complaint today is palpitations.  He has these episodes once every 48-72 hours.  The tend to last a few minutes at a time.  The prominently felt at nighttime or sometimes when he exerts more than usual.  There is no associated dizziness, lightheadedness, fainting, chest pains or change in his shortness of breath.      He ambulates with the help of a cane gets short of breath on moderate activity, unchanged pattern or severity.  He denies any chest pains.  Previous remote coronary angiography had shown mild nonobstructive CAD.  The last stress test in 2020 showed a small area of mix of infarct and ischemia in the inferolateral wall. The LV ejection fraction is mildly reduced with an EF of 45%.  This is a chronic finding.    The?ascending aortic aneurysm repair many years ago.??The last CT done in 2022-noncontrast study, appearance was similar to the one done in 2020.  The previous 2020 study was reviewed by ?Dr. Helen Hashimoto?of [facility]?personally and he felt that there is nothing to worry about.?  ?  He is compliant with his medications. ?Cholesterol profile in July this year had shown excellent control of LDL. ??  ?  The Xarelto is being taken for his history of PE.?  ?  He does not smoke.            Vitals:    01/16/22 0828   BP: 99/64   BP Source: Arm, Left Upper   Pulse: 72   SpO2: 97%   PainSc: Zero   Weight: 121.5 kg (267 lb 12.8 oz)   Height: 177.8 cm (5' 10)     Body mass index is 38.43 kg/m?Marland Kitchen     Past Medical History  Patient Active Problem List    Diagnosis Date Noted   ? 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 Venous stasis ulcer of other part of lower leg limited to breakdown of skin, unspecified laterality, unspecified whether varicose veins present (HCC) 10/30/2021   ? Carpal tunnel syndrome of left wrist 07/06/2021     emg by Dr Lynnae Sandhoff at [facility] shows severe median neuropathy     ? Leg swelling 01/12/2021   ? 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 Type 2 diabetes mellitus with diabetic polyneuropathy, with long-term current use of insulin (HCC) 10/24/2020   ? History of deep venous thrombosis 03/24/2019   ? 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 Lumbar radiculopathy 09/30/2015   ? 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     09/2015 Mild on prior assessments     ? 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 Started Rapaflo 8mg   01/26/14 - LUTS much improved. Continue Rapaflo 8mg   02/15/15 - stable LUTS      08/16/15- Minimal nocturia and no other symptoms. Interested in trialing discontinuation of his Rapaflo. He will call if his symptoms worsen and we can refill his medications over the phone.     08/14/16 - symptoms stable, PVR 64mL      ? Class 3  severe obesity due to excess calories with serious comorbidity and body mass index (BMI) of 40.0 to 44.9 in adult [facility]) 10/28/2013   ? 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 New finding compared to prior EF 45-50% in '09 (by echo at [facility]), LV gram from '09 reviewed- EF 45%     ? 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 Has been personally reviewed by Dr Helen Hashimoto and no concerns     ? Essential hypertension 01/08/2008     09-17-14: Well controlled. Taking Lisinopril 10 mg daily. Coreg 25 mg 0.5 tablet BID  Well controlled     ? Dyslipidemia 01/08/2008     10-08-14: TC 164, HDL 35, TG 237. On Zocor 20 mg  07-07-14: TC 209, LDL 234, HDL 45, TG 234  11/14 TC 153, LDL 74, HDL 45, TG 172  1/14 good numbers  5/13 TC 152, LDL 76, HDL 39, TG 183  2/11 TC 147, LDL 81, HDL 42, TG 138  Panel checked in 6/10- TC 144, LDL 87, HDL 36, TG 131, on simva 40 qhs, intolerant of niacin     ? CAD (coronary artery disease) 01/08/2008     Mild to moderate, non-obstructive by angio in 10/09 done following an abnormal stress thallium            Review of Systems   Constitutional: Negative.   HENT: Negative.    Eyes: Negative.    Cardiovascular: Positive for irregular heartbeat.   Respiratory: Negative.    Endocrine: Negative.    Hematologic/Lymphatic: Negative.    Skin: Negative.    Gastrointestinal: Negative.    Genitourinary: Negative.    Neurological: Positive for dizziness.   Psychiatric/Behavioral: Negative.    Allergic/Immunologic: Negative.        Physical Exam    General Appearance: no acute distress  Skin: warm, moist, no ulcers  HEENT: unremarkable  Neck Veins: neck veins are flat, neck veins are not distended  Carotid Arteries: normal carotid upstroke bilaterally, no bruits  Chest Inspection: chest is normal in appearance  Auscultation/Percussion: lungs clear to auscultation, no rales, rhonchi, or wheezing  Cardiac Rhythm: regular rhythm and normal rate  Cardiac Auscultation: Normal S1 & S2, no S3 or S4, no rub  Murmurs: soft esm base   Extremities: trace lower extremity edema;  Abdominal Exam: soft, non-tender, no masses, bowel sounds normal  Liver & Spleen: no organomegaly  Neurologic Exam: neurological assessment grossly intact    EKG shows sinus rhythm, small QRS complexes, no ischemic changes, no old Q waves  Cardiovascular Health Factors  Vitals BP Readings from Last 3 Encounters:   01/16/22 99/64   01/02/22 115/69   12/22/21 (!) 82/54     Wt Readings from Last 3 Encounters:   01/16/22 121.5 kg (267 lb 12.8 oz)   01/02/22 122.3 kg (269 lb 9.6 oz)   12/22/21 120.2 kg (265 lb)     BMI Readings from Last 3 Encounters:   01/16/22 38.43 kg/m?   01/02/22 38.68 kg/m?   12/22/21 38.02 kg/m?      Smoking Social History     Tobacco Use   Smoking Status Former   ? Packs/day: 2.00   ? Years:  15.00   ? Additional pack years: 0.00   ? Total pack years: 30.00   ? Types: Cigarettes   ? Quit date: 08/24/1991   ? Years since quitting: 30.4   Smokeless Tobacco Never      Lipid Profile Cholesterol   Date Value Ref Range Status   10/30/2021 106 <200 MG/DL Final     HDL   Date Value Ref Range Status   10/30/2021 35 (L) >40 MG/DL Final     LDL   Date Value Ref Range Status   10/30/2021 46 <100 mg/dL Final     Triglycerides   Date Value Ref Range Status   10/30/2021 220 (H) <150 MG/DL Final      Blood Sugar Hemoglobin A1C   Date Value Ref Range Status   10/30/2021 8.5 (H) 4.0 - 5.7 % Final     Comment:     The ADA recommends that most patients with type 1 and type 2 diabetes maintain   an A1c level <7%.       Glucose   Date Value Ref Range Status   10/30/2021 169 (H) 70 - 100 MG/DL Final   14/78/2956 213 (H) 70 - 100 MG/DL Final   08/65/7846 962 (H) 70 - 100 MG/DL Final     Glucose Fasting   Date Value Ref Range Status   09/01/2015 117 (H) 70 - 100 MG/DL Final     Glucose, POC   Date Value Ref Range Status   12/22/2021 164 (A) 70 - 100 Final   12/22/2021 197 (A) 70 - 100 Final   04/18/2017 98 70 - 100 MG/DL Final     Glucose POC   Date Value Ref Range Status   03/18/2019 99 65 - 110 mg/dL Final   95/28/4132 440 (A) 65 - 110 mg/dL Final   02/03/2535 644 65 - 110 mg/dL Final          Problems Addressed Today  Encounter Diagnoses   Name Primary?   ? Cardiomyopathy, idiopathic (HCC) Yes   ? Irregular heart beats    ? Essential hypertension    ? Status post thoracic aortic aneurysm repair    ? Palpitations        Assessment and Plan    ?In summary, Victor Graham is a?71 year old gentleman with the following cardiac and related issues:  ??  1. History of mild nonischemic cardiomyopathy,?last echo in April 2022, EF of 45%, euvolemic, on good medical treatment.  Blood pressures are very well controlled. ????????  2. Hypertension,?very well?controlled.  Lisinopril prescription was sent in  3. History of unprovoked PE,?with positive family history DVT?and positive lupus anticoagulant,?to be maintained on long-term Xarelto.   4. Dyslipidemia-? on Crestor with excellent LDL control as of 10/2021.    5. PVCs-feeling more sustained palpitation episodes nowadays.  We will obtain a Zio patch.  Continue beta-blockers for now ???  6. History of ascending aortic aneurysm repair?in 2010.???  Residual dissection reported in 2020.    Unchanged noncontrast CT in 2022.  We will obtain another CT in 2024, this time with contrast  7. DM2,?followed by Dr.?Noland???  8. Mildly abnl stress test,? asymptomatic, on statins, beta-blockers and with no chest discomfort  ?    It was a pleasure to see Victor Graham in the clinic today.           Current Medications (including today's revisions)  ? carvediloL (COREG) 6.25 mg tablet Take one tablet by mouth twice daily.   ? dulaglutide (TRULICITY)  0.75 mg/0.5 mL injection pen Inject 0.5 mL under the skin every 7 days.   ? ezetimibe (ZETIA) 10 mg tablet Take one tablet by mouth daily.   ? furosemide (LASIX) 20 mg tablet Take one tablet by mouth daily as needed.   ? GLUCOSAMINE HCL PO Take 1,000 mg by mouth twice daily.   ? lisinopriL (ZESTRIL) 10 mg tablet Take one tablet by mouth daily.   ? meloxicam (MOBIC) 15 mg tablet Take one-half tablet to one tablet by mouth daily as needed. (Patient taking differently: Take one-half tablet to one tablet by mouth twice daily as needed.)   ? metFORMIN (GLUCOPHAGE) 1,000 mg tablet Take one tablet by mouth twice daily with meals.   ? pregabalin (LYRICA) 200 mg capsule Take one capsule by mouth twice daily.   ? rivaroxaban (XARELTO) 20 mg tablet Take one tablet by mouth daily with dinner.   ? rosuvastatin (CRESTOR) 20 mg tablet Take one tablet by mouth daily.   ? SAW PALMETTO PO Take  by mouth.   ? spironolactone (ALDACTONE) 25 mg tablet Take one tablet by mouth daily.   ? traMADoL (ULTRAM) 50 mg tablet Take one tablet by mouth every 8 hours as needed.   ? triamcinolone acetonide (KENALOG) 0.1 % topical ointment as Needed.

## 2022-01-16 NOTE — Patient Instructions
Thank you for visiting our office today.   We recommend follow-up with our office in 6 months.   Please call [phone] if you're unable to schedule the office visit Dr. Curly Shores ordered.     Please complete the following orders:     Please call [phone] to schedule the CT scan Dr. Curly Shores ordered for March   Take your medications as ordered.   Check your list with what you have on hand at home.   Should you have any additional questions or concerns, please message me through MyChart or call the office.    Cardiovascular Medicine Team   Clinic phone: [phone]

## 2022-01-16 NOTE — Progress Notes
Ambulatory (External) Cardiac Monitor Enrollment Record     Placement Location: Clinic Placement  Clinic Location: [place]  Vendor: [employer] ([employer])  Mobile Cardiac Telemetry (MCOT/MCT)?: No  Duration of Monitor (in days): 14  Monitor Diagnosis: Palpitations (R00.2)  No data recordedOrdering Provider: Bhagat  AMB Monitor Serial Number: [id number]  No data recorded    Start Time and Date: 01/16/22 9:12 AM   Patient Name: Victor Graham  DOB: 01-15-50 1951-02-11  MRN: [id number]  Sex: male  Mobile Phone Number: [phone] (mobile)  Home Phone Number: [phone]  Patient Address: [address] [place] [address]  Insurance Coverage: MEDICARE PART A AND B  Insurance ID: [id number]  Insurance Group #:   Insurance Subscriber: [name],[name]  Implanted Cardiac Device Information: No results found for: "EPDEVTYP"      Patient instructed to contact company phone number on the monitor box with questions regarding billing, placement, troubleshooting.     Vanice Sarah, RN    ____________________________________________________________    Clinic Staff:     Complete additional steps for documentation double check/Co-Sign.   In Follow-up, send chart upon closing encounter to [name]    [facility] Amb[facility] Monitoring Team:  1. Schedule on appropriate template and check-in.   Clinic Placement Schedule on clinic location [facility] schedule   Home Enrollment Schedule on Home Enrollment schedule ([facility])   Given to patient in clinic for self-placement Schedule on Home Enrollment schedule ([facility])   Inpatient Schedule on [facility] AMBULATORY MONITORING template   2. Please enroll with appropriate vendor.

## 2022-01-16 NOTE — Progress Notes
I have served as the witness to ambulatory cardiac monitor placement. I have reviewed the vendor and serial number of the monitor provided to Victor Graham and verified that the information documented in the ambulatory monitor flowsheet is accurate and complete.     Inge Rise, [place]

## 2022-01-23 ENCOUNTER — Encounter: Admit: 2022-01-23 | Discharge: 2022-01-23 | Payer: MEDICARE

## 2022-01-23 ENCOUNTER — Ambulatory Visit: Admit: 2022-01-23 | Discharge: 2022-01-24 | Payer: MEDICARE

## 2022-01-23 DIAGNOSIS — I351 Nonrheumatic aortic (valve) insufficiency: Secondary | ICD-10-CM

## 2022-01-23 DIAGNOSIS — I2699 Other pulmonary embolism without acute cor pulmonale: Secondary | ICD-10-CM

## 2022-01-23 DIAGNOSIS — M4800 Spinal stenosis, site unspecified: Secondary | ICD-10-CM

## 2022-01-23 DIAGNOSIS — D689 Coagulation defect, unspecified: Secondary | ICD-10-CM

## 2022-01-23 DIAGNOSIS — G5602 Carpal tunnel syndrome, left upper limb: Secondary | ICD-10-CM

## 2022-01-23 DIAGNOSIS — K760 Fatty (change of) liver, not elsewhere classified: Secondary | ICD-10-CM

## 2022-01-23 DIAGNOSIS — M5116 Intervertebral disc disorders with radiculopathy, lumbar region: Secondary | ICD-10-CM

## 2022-01-23 DIAGNOSIS — N529 Male erectile dysfunction, unspecified: Secondary | ICD-10-CM

## 2022-01-23 DIAGNOSIS — J189 Pneumonia, unspecified organism: Secondary | ICD-10-CM

## 2022-01-23 DIAGNOSIS — E669 Obesity, unspecified: Secondary | ICD-10-CM

## 2022-01-23 DIAGNOSIS — T148XXA Other injury of unspecified body region, initial encounter: Secondary | ICD-10-CM

## 2022-01-23 DIAGNOSIS — M5136 Other intervertebral disc degeneration, lumbar region: Secondary | ICD-10-CM

## 2022-01-23 DIAGNOSIS — I7121 Ascending aortic aneurysm (HCC): Secondary | ICD-10-CM

## 2022-01-23 DIAGNOSIS — M25551 Pain in right hip: Secondary | ICD-10-CM

## 2022-01-23 DIAGNOSIS — M79604 Pain in right leg: Secondary | ICD-10-CM

## 2022-01-23 DIAGNOSIS — E785 Hyperlipidemia, unspecified: Secondary | ICD-10-CM

## 2022-01-23 DIAGNOSIS — R768 Other specified abnormal immunological findings in serum: Secondary | ICD-10-CM

## 2022-01-23 DIAGNOSIS — I493 Ventricular premature depolarization: Secondary | ICD-10-CM

## 2022-01-23 DIAGNOSIS — R011 Cardiac murmur, unspecified: Secondary | ICD-10-CM

## 2022-01-23 DIAGNOSIS — I2510 Atherosclerotic heart disease of native coronary artery without angina pectoris: Secondary | ICD-10-CM

## 2022-01-23 DIAGNOSIS — M5416 Radiculopathy, lumbar region: Secondary | ICD-10-CM

## 2022-01-23 DIAGNOSIS — H269 Unspecified cataract: Secondary | ICD-10-CM

## 2022-01-23 DIAGNOSIS — I428 Other cardiomyopathies: Secondary | ICD-10-CM

## 2022-01-23 DIAGNOSIS — M1990 Unspecified osteoarthritis, unspecified site: Secondary | ICD-10-CM

## 2022-01-23 DIAGNOSIS — M2550 Pain in unspecified joint: Secondary | ICD-10-CM

## 2022-01-23 DIAGNOSIS — G609 Hereditary and idiopathic neuropathy, unspecified: Secondary | ICD-10-CM

## 2022-01-23 DIAGNOSIS — Z9689 Presence of other specified functional implants: Secondary | ICD-10-CM

## 2022-01-23 DIAGNOSIS — G971 Other reaction to spinal and lumbar puncture: Secondary | ICD-10-CM

## 2022-01-23 DIAGNOSIS — I10 Essential (primary) hypertension: Secondary | ICD-10-CM

## 2022-01-23 MED ORDER — TIZANIDINE 2 MG PO TAB
4 mg | ORAL_TABLET | Freq: Three times a day (TID) | ORAL | 0 refills | Status: AC
Start: 2022-01-23 — End: ?

## 2022-01-23 MED ORDER — MELOXICAM 15 MG PO TAB
7.5-15 mg | ORAL_TABLET | Freq: Every day | ORAL | 3 refills | 30.00000 days | Status: AC | PRN
Start: 2022-01-23 — End: ?

## 2022-01-23 NOTE — Progress Notes
[facility]    Subjective     Chief Complaint: Pain  Chief Complaint   Patient presents with   ? Lower Back - Pain   ? Pain       HPI: Victor Graham is a 71 y.o. 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 who presents for evaluation.    LBP  present for years  lower back  rarely will radiate to bilateral buttock  worse with bending, standing or walking   Alleviated by sitting or rest  Reports BLE weakness  VAS 2-8/10      Reports about 40% relief with SCS  Only has one lead         PRIOR MEDICATIONS:   Effective  Lyrica  NSAID  Acetaminophen    Ineffective  Gabapentin     Unable to tolerate      Never  Ami/Nortriptyline  Cymbalta  Tizanidine      PRIOR INTERVENTIONS:  Lumbar fusion 2009  Effective  TFESI - transient  SCS    Ineffective  Lumbar RFA        Past Medical History:  Medical History:   Diagnosis Date   ? Arthritis    ? Ascending aortic aneurysm (HCC) 01/08/2008   ? Asymptomatic PVCs 03/11/2015    03/2015 per pt, has had them for a long time & does not feel them usually    ? Blood clotting disorder (HCC) 05 Apr 2017    hex lupus anticoagulant   ? CAD (coronary artery disease) ? Carpal tunnel syndrome of left wrist     emg by Dr Lynnae Sandhoff at [facility] shows severe median neuropathy   ? Cataract     Cateract Surgery Dec 2022   ? Degenerative disc disease, lumbar    ? Dyslipidemia    ? Erectile dysfunction    ? Fatty liver    ? Heart murmur Nov 2020   ? HLD (hyperlipidemia) 01/08/2008   ? Hypertension 01/08/2008   ? Idiopathic polyneuropathy    ? Joint pain Jul 2008   ? Lumbar radiculopathy    ? Nerve injury    ? NICM (nonischemic cardiomyopathy) (HCC)    ? 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 Obesity    ? Pneumonia 2008   ? Positive ANA (antinuclear antibody) 02/03/2016   ? Positive ANA (antinuclear antibody) 02/03/2016   ? Pulmonary embolism [facility]) 2015 -- july 20th    takes xaralto anticoagulant -- treated at [facility]   ? Spinal headache May 1993   ? Spinal stenosis 04 Sep 2015       Family History:  Family History   Problem Relation Age of Onset   ? DVT Mother    ? Arthritis Mother    ? Joint Pain Mother    ? Cancer Father         Prostate  cancer   ? DVT Brother    ? Stroke Paternal Grandfather    ? Cancer Paternal Grandmother    ? Arthritis Paternal Grandmother    ? Stroke Maternal Grandfather    ? Cancer Maternal Grandmother         Colon Cancer       Social History:  [place]ives in [place] [id number]    Social History     Socioeconomic History   ? Marital status: Married   Tobacco Use   ? Smoking status: Former     Packs/day: 2.00     Years: 15.00     Additional pack years: 0.00     Total pack years: 30.00     Types: Cigarettes     Quit date: 08/24/1991     Years since quitting: 30.4   ? Smokeless tobacco: Never   Substance and Sexual Activity   ? Alcohol use: No   ? Drug use: No   ? Sexual activity: Not Currently     Partners: Female     Birth control/protection: None   Other Topics Concern   ? Financial planner Yes     CommentPersonnel officer, [employer]       Allergies:  Allergies   Allergen Reactions   ? Atorvastatin FLUSHING (SKIN), SEE COMMENTS and HIVES     rash     ? Niacin FLUSHING (SKIN) and SEE COMMENTS     Other reaction(s): Red flush, itching  Other reaction(s): itching/redness   ? Other [Unclassified Drug] BLISTERS     dissolving stiches in mouth after dental surgery caused blisters   ? Rubber SEE COMMENTS     Neoprene rubber causes contact dermatitis (CAUSED BY THIOUREA IN NEOPRENE)       Medications:    Current Outpatient Medications:   ?  carvediloL (COREG) 6.25 mg tablet, Take one tablet by mouth twice daily., Disp: 180 tablet, Rfl: 3  ?  dulaglutide (TRULICITY) 0.75 mg/0.5 mL injection pen, Inject 0.5 mL under the skin every 7 days., Disp: 2 mL, Rfl: 3  ?  ezetimibe (ZETIA) 10 mg tablet, Take one tablet by mouth daily., Disp: 90 tablet, Rfl: 3  ?  furosemide (LASIX) 20 mg tablet, Take one tablet by mouth daily as needed., Disp: 90 tablet, Rfl: 1  ?  GLUCOSAMINE HCL PO, Take 1,000 mg by mouth twice daily., Disp: , Rfl:   ?  lisinopriL (ZESTRIL) 10 mg tablet, Take one tablet by mouth daily., Disp: 90 tablet, Rfl: 3  ?  meloxicam (MOBIC) 15 mg tablet, Take one-half tablet to one tablet by mouth daily as needed., Disp: 90 tablet, Rfl: 3  ?  metFORMIN (GLUCOPHAGE) 1,000 mg tablet, Take one tablet by mouth twice daily with meals., Disp: 180 tablet, Rfl: 3  ?  pregabalin (LYRICA) 200 mg capsule, Take one capsule by mouth twice daily., Disp: 180 capsule, Rfl: 3  ?  rivaroxaban (XARELTO) 20 mg tablet, Take one tablet by mouth daily with dinner., Disp: 90 tablet, Rfl: 2  ?  rosuvastatin (CRESTOR) 20 mg tablet, Take one tablet by mouth daily., Disp: 90 tablet, Rfl: 1  ?  SAW PALMETTO PO, Take  by mouth., Disp: , Rfl:   ?  spironolactone (ALDACTONE) 25 mg tablet, Take one tablet by mouth daily., Disp: 90 tablet, Rfl: 3  ?  tiZANidine (ZANAFLEX) 2 mg tablet, Take two tablets by mouth three times daily., Disp: 60 tablet, Rfl: 0  ?  traMADoL (ULTRAM) 50 mg tablet, Take one  tablet by mouth every 8 hours as needed., Disp: 90 tablet, Rfl: 2  ?  triamcinolone acetonide (KENALOG) 0.1 % topical ointment, as Needed., Disp: , Rfl:     Physical examination:   BP 118/78  - Pulse 68  - Temp 36.5 ?C (97.7 ?F)  - Ht 177.8 cm (5' 10)  - Wt 122 kg (269 lb)  - SpO2 97%  - BMI 38.60 kg/m?   Pain Score: Two  Oswestry Total Score:: 42    Physical Exam:  General: The patient is a well-developed, well nourished 71 y.o. male in no acute distress.   HEENT: Head is normocephalic and atraumatic. Pupils are equal and reactive to light bilaterally.   Cardiac: Based on palpation, pulse appears to be regular rate and rhythm.   Pulmonary: The patient has unlabored respirations and bilateral symmetric chest excursion.   Abdomen: Soft, nontender, and nondistended. There is no rebound or guarding.   Extremities: No clubbing, cyanosis, or edema.     Neurologic:   The patient is alert and oriented times 3.       Musculoskeletal:   Gait is antalgic     L-Spine   There is mod/lower lumbar paraspinal tenderness. Paraspinal muscle tone is increased  Facet loading is negative.  There is no tenderness or radiating pain with palpation over the SI joints, piriformis, or greater trochanteric bursae bilaterally.  FABER is negative.  ROM with flexion, extension, rotation, and lateral bending is intact.  Strength is equal and adequate bilaterally in the flexors and extensors of the bilateral lower extremities.   SLR is negative bilaterally.        Results for orders placed during the hospital encounter of 08/25/13    MRI L-SPINE WO CONTRAST    Narrative  EXAM: LUMBAR SPINE MRI WITHOUT CONTRAST.    History: Low back pain and neuropathy in feet.    Comparison studies: Plain film examination from Aug 12, 2013.    Technique: Multiplanar and multisequence MR imaging of the lumbar spine  was performed, gadolinium contrast was not utilized.    Description: There is slight leftward curvature of the mid lumbar spine.  There is multilevel degenerative loss of disc space height and signal.  There are associated endplate changes and anterior and marginal  osteophytes. At L4-L5 there is been a previous posterolateral fusion and  instrumentation. The conus terminates normally.    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    L1-L2: There is a broad-based disc bulge extending into both foraminal  regions. There is moderate central stenosis, mild bilateral foraminal  stenosis.    L2-L3: There is a diffuse broad-based disc bulge extending into both  foraminal regions. There is ligament is h  ypertrophic change. There is  moderate to severe central stenosis, moderate bilateral foraminal  stenosis.    L3-L4: There is a diffuse broad-based disc bulge extending into both  foraminal regions, including a right intraforaminal disc herniation.  There is facet and ligament is hypertrophic change. There is moderate to  severe central stenosis, severe right lateral recess and right foraminal  stenosis. There is moderate left foraminal stenosis.    L4-L5: There is an annular bulge extending into both foraminal regions.  This results in moderate left foraminal stenosis.    L5-S1: There is an annular bulge extending into both foraminal regions.  There is moderate left foraminal stenosis, mild to moderate right  foraminal stenosis.    Impression  MRI  L-SPINE W/O CONTRAST        IMPRESSION:    1. AT L1-L2 COMBINATION OF DEGENERATIVE CHANGES RESULTS IN MODERATE  CENTRAL STENOSIS.    2. AT L2-L3 A COMBINATION OF DEGENERATIVE CHANGES RESULTS IN MODERATE TO  SEVERE CENTRAL STENOSIS, MODERATE BILATERAL FORAMINAL STENOSIS.    3. AT L3-L4 COMBINATION OF DEGENERATIVE CHANGES RESULTS IN SEVERE RIGHT  LATERAL AND RIGHT FORAMINAL STENOSIS (FROM AND INTRAFORAMINAL DISC  HERNIATION), MODERATE TO SEVERE CENTRAL STENOSIS, AND MODERATE LEFT  FORAMINAL STENOSIS.    4. AT L4-L5 AND L5-S1 DISC BULGES RESULT IN MODERATE LEFT FORAMINAL  STENOSIS.    5. CONGENITAL SPINAL STENOSIS FURTHER ACCENTUATES ALL OF THE ABOVE  DEGENERATIVE FINDINGS.      Electronically signed on: Aug 25 2013 12:20PM by Hessie Diener REEVES      Last Cr and LFT's:  Creatinine   Date Value Ref Range Status   10/30/2021 0.72 0.4 - 1.24 MG/DL Final     AST (SGOT)   Date Value Ref Range Status   10/30/2021 42 (H) 7 - 40 U/L Final     ALT (SGPT)   Date Value Ref Range Status   10/30/2021 52 7 - 56 U/L Final     Alk Phosphatase   Date Value Ref Range Status   10/30/2021 32 25 - 110 U/L Final     Total Bilirubin   Date Value Ref Range Status   10/30/2021 1.5 (H) 0.3 - 1.2 MG/DL Final          Assessment:    Victor Graham is a 71 y.o. 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 who presents for evaluation of pain.    The pain complaints are most likely due to:    1. Lumbar disc disease with radiculopathy  MRI L-SPINE WO CONTRAST      2. Chronic right hip pain  meloxicam (MOBIC) 15 mg tablet      3. Pain of right lower extremity  meloxicam (MOBIC) 15 mg tablet      4. S/P insertion of spinal cord stimulator            Patient has had an adequate trial of > 12 month of rest, exercise, multimodal treatment, and the passage of time without improvement of symptoms. The pain has significant impact on the daily quality of life.     Plan:    1.Discussed care options with patient. Plan on MRI L-spine at next available appointment.  Failing conservative measures including formal PT, pharmacological agents and injections    2. Consider referral to surgery, will await MRI results  3. [employer] rep available, reprogramming and support provided  4. Trial mobic and tizanidine  5. Encouraged to continue at home PT program.  6. Follow up after MRI L-spine      Risks/benefits of all pharmacologic and interventional treatments discussed and questions answered.

## 2022-01-24 DIAGNOSIS — G8929 Other chronic pain: Secondary | ICD-10-CM

## 2022-01-30 ENCOUNTER — Encounter: Admit: 2022-01-30 | Discharge: 2022-01-30 | Payer: MEDICARE

## 2022-01-30 ENCOUNTER — Ambulatory Visit: Admit: 2022-01-30 | Discharge: 2022-01-31 | Payer: MEDICARE

## 2022-01-30 DIAGNOSIS — H269 Unspecified cataract: Secondary | ICD-10-CM

## 2022-01-30 DIAGNOSIS — I2510 Atherosclerotic heart disease of native coronary artery without angina pectoris: Secondary | ICD-10-CM

## 2022-01-30 DIAGNOSIS — E669 Obesity, unspecified: Secondary | ICD-10-CM

## 2022-01-30 DIAGNOSIS — I10 Essential (primary) hypertension: Secondary | ICD-10-CM

## 2022-01-30 DIAGNOSIS — J189 Pneumonia, unspecified organism: Secondary | ICD-10-CM

## 2022-01-30 DIAGNOSIS — E785 Hyperlipidemia, unspecified: Secondary | ICD-10-CM

## 2022-01-30 DIAGNOSIS — G5602 Carpal tunnel syndrome, left upper limb: Secondary | ICD-10-CM

## 2022-01-30 DIAGNOSIS — G971 Other reaction to spinal and lumbar puncture: Secondary | ICD-10-CM

## 2022-01-30 DIAGNOSIS — I351 Nonrheumatic aortic (valve) insufficiency: Secondary | ICD-10-CM

## 2022-01-30 DIAGNOSIS — I2699 Other pulmonary embolism without acute cor pulmonale: Secondary | ICD-10-CM

## 2022-01-30 DIAGNOSIS — I493 Ventricular premature depolarization: Secondary | ICD-10-CM

## 2022-01-30 DIAGNOSIS — I7121 Ascending aortic aneurysm (HCC): Secondary | ICD-10-CM

## 2022-01-30 DIAGNOSIS — I428 Other cardiomyopathies: Secondary | ICD-10-CM

## 2022-01-30 DIAGNOSIS — M5136 Other intervertebral disc degeneration, lumbar region: Secondary | ICD-10-CM

## 2022-01-30 DIAGNOSIS — G609 Hereditary and idiopathic neuropathy, unspecified: Secondary | ICD-10-CM

## 2022-01-30 DIAGNOSIS — T148XXA Other injury of unspecified body region, initial encounter: Secondary | ICD-10-CM

## 2022-01-30 DIAGNOSIS — N529 Male erectile dysfunction, unspecified: Secondary | ICD-10-CM

## 2022-01-30 DIAGNOSIS — E1165 Type 2 diabetes mellitus with hyperglycemia: Secondary | ICD-10-CM

## 2022-01-30 DIAGNOSIS — R011 Cardiac murmur, unspecified: Secondary | ICD-10-CM

## 2022-01-30 DIAGNOSIS — R768 Other specified abnormal immunological findings in serum: Secondary | ICD-10-CM

## 2022-01-30 DIAGNOSIS — M5416 Radiculopathy, lumbar region: Secondary | ICD-10-CM

## 2022-01-30 DIAGNOSIS — E1142 Type 2 diabetes mellitus with diabetic polyneuropathy: Secondary | ICD-10-CM

## 2022-01-30 DIAGNOSIS — D689 Coagulation defect, unspecified: Secondary | ICD-10-CM

## 2022-01-30 DIAGNOSIS — K760 Fatty (change of) liver, not elsewhere classified: Secondary | ICD-10-CM

## 2022-01-30 DIAGNOSIS — M2550 Pain in unspecified joint: Secondary | ICD-10-CM

## 2022-01-30 DIAGNOSIS — M4800 Spinal stenosis, site unspecified: Secondary | ICD-10-CM

## 2022-01-30 DIAGNOSIS — M1990 Unspecified osteoarthritis, unspecified site: Secondary | ICD-10-CM

## 2022-01-30 MED ORDER — METFORMIN 1,000 MG PO TAB
1000 mg | ORAL_TABLET | Freq: Two times a day (BID) | ORAL | 3 refills | Status: AC
Start: 2022-01-30 — End: ?

## 2022-01-30 MED ORDER — TRULICITY 1.5 MG/0.5 ML SC PNIJ
1.5 mg | SUBCUTANEOUS | 1 refills | Status: AC
Start: 2022-01-30 — End: ?

## 2022-01-30 NOTE — Progress Notes
Date of Service: 01/30/2022    Victor Graham is a 71 y.o. male.  DOB: [birth year]  MRN: [id number]     Subjective:             History of Present Illness    Diet adherence:most of the time   Medication adherence:most of the time     Diabetes Management:  The patient has not had hypoglycemic reactions  Lab Results   Component Value Date/Time    HGBA1C 8.5 (H) 10/30/2021 10:51 AM    HGBA1C 6.9 (H) 10/24/2020 10:42 AM    HGBA1C 6.9 (H) 06/01/2020 02:06 PM    A1C 6.9 (A) 01/30/2022 12:00 AM    HGBPOC 12.9 (L) 07/07/2008 11:49 AM    CHOL 106 10/30/2021 10:51 AM    TRIG 220 (H) 10/30/2021 10:51 AM    HDL 35 (L) 10/30/2021 10:51 AM    LDL 46 10/30/2021 10:51 AM    VLDL 44 10/30/2021 10:51 AM    NONHDLCHOL 71 10/30/2021 10:51 AM    CR 0.72 10/30/2021 10:51 AM    MCALBR 9.7 11/15/2021 10:36 AM    ALT 52 10/30/2021 10:51 AM    CK 255 (H) 01/06/2019 03:47 PM    VITD25 46.5 10/20/2019 09:03 AM        Microalbumin tested in last 12 months?  Yes  Eye exam within the last 12 months? Yes  Comprehensive Foot exam within the last 12 months? Yes  Pneumonia shot current? Yes  The patient is taking an ACE inhibitor or an ARB:Yes   The patient is taking a statin:Yes    Hypertension Management:    Outside blood pressures being performed: Yes  BP Readings from Last 3 Encounters:   01/30/22 (P) 114/62   01/23/22 118/78   01/16/22 99/64     He denies significant light-headedness.    Hyperlipidemia Management  Side effects to medications? No             Review of Systems      Objective:         ? carvediloL (COREG) 6.25 mg tablet Take one tablet by mouth twice daily.   ? dulaglutide (TRULICITY) 1.5 mg/0.5 mL injection pen Inject 0.5 mL under the skin every 7 days.   ? ezetimibe (ZETIA) 10 mg tablet Take one tablet by mouth daily.   ? furosemide (LASIX) 20 mg tablet Take one tablet by mouth daily as needed.   ? GLUCOSAMINE HCL PO Take 1,000 mg by mouth twice daily.   ? lisinopriL (ZESTRIL) 10 mg tablet Take one tablet by mouth daily. ? meloxicam (MOBIC) 15 mg tablet Take one-half tablet to one tablet by mouth daily as needed.   ? metFORMIN (GLUCOPHAGE) 1,000 mg tablet Take one tablet by mouth twice daily with meals.   ? pregabalin (LYRICA) 200 mg capsule Take one capsule by mouth twice daily.   ? rivaroxaban (XARELTO) 20 mg tablet Take one tablet by mouth daily with dinner.   ? rosuvastatin (CRESTOR) 20 mg tablet Take one tablet by mouth daily.   ? SAW PALMETTO PO Take  by mouth.   ? spironolactone (ALDACTONE) 25 mg tablet Take one tablet by mouth daily.   ? tiZANidine (ZANAFLEX) 2 mg tablet Take two tablets by mouth three times daily.   ? traMADoL (ULTRAM) 50 mg tablet Take one tablet by mouth every 8 hours as needed.   ? triamcinolone acetonide (KENALOG) 0.1 % topical ointment as Needed.     Vitals:  01/30/22 0849   BP: (P) 114/62   Pulse: (P) 73   Temp: 37 ?C (98.6 ?F)   Resp: 16   SpO2: (P) 97%   PainSc: Two   Weight: 120.5 kg (265 lb 9.6 oz)   Height: 177.8 cm (5' 10)     Body mass index is 38.11 kg/m?Marland Kitchen     Physical Exam  Vitals reviewed.   Constitutional:       Appearance: Normal appearance. He is well-developed.   HENT:      Head: Normocephalic and atraumatic.      Nose: Nose normal.   Eyes:      Conjunctiva/sclera: Conjunctivae normal.   Cardiovascular:      Rate and Rhythm: Normal rate.   Pulmonary:      Effort: Pulmonary effort is normal.   Skin:     General: Skin is warm.      Capillary Refill: Capillary refill takes less than 2 seconds.      Findings: No rash.   Neurological:      Mental Status: He is alert.      Coordination: Coordination normal.   Psychiatric:         Behavior: Behavior normal.         Thought Content: Thought content normal.         Judgment: Judgment normal.              Assessment and Plan:  1. Type 2 diabetes mellitus with hyperglycemia, without long-term current use of insulin (HCC)  2. Type 2 diabetes mellitus with diabetic polyneuropathy, without long-term current use of insulin (HCC)  Diabetes Mellitus    Plan:   Discussed general issues about diabetes pathophysiology and management.  Discussed exercise management and diet with emphasis on vegetables, fruit and lean meat.  Discussed foot care.  Reminded to get retinal exam annually and dental appointment every 6 months.  Treatment goals: A1C < or = 7.0       BP <140/90  Are barriers to achieving goals present? No  Medication education provided. Patient voiced understanding? Yes  Patient able to self-manage and ready to comply? Yes  Educational resources identified? Verbal Counseling  - I reviewed with the patient their current medications and specifically any new medications prescribed at the time of this visit and we reviewed the expected benefits and potential side effects. All questions are answered to the patient's satisfaction.  - will increase his trulicity to 1.5mg  weekly.  He will f/u in 3 months for recheck.  If he begins to experience GI symptoms, we can consider reducing and/or stopping the metformin  - dulaglutide (TRULICITY) 1.5 mg/0.5 mL injection pen; Inject 0.5 mL under the skin every 7 days.  Dispense: 6 mL; Refill: 1  - metFORMIN (GLUCOPHAGE) 1,000 mg tablet; Take one tablet by mouth twice daily with meals.  Dispense: 180 tablet; Refill: 3  - POC HEMOGLOBIN A1C    3. Essential hypertension  Hypertension    Plan:   Discussed hypertension and reviewed goals.  Are barriers to achieving goals present? No  Medication education provided. Patient voiced understanding? Yes  Patient able to self-manage and ready to comply? Yes  Educational resources identified? Verbal Counseling       4. Hyperlipidemia, unspecified hyperlipidemia type  Hyperlipidemia    Plan:  Discussed labs and reviewed goals for LDL, HDL, triglycerides.   Discussed exercise management and diet with emphasis on vegetables, fruit and lean meat.  Are barriers to achieving goals present? No  Medication education provided. Patient voiced understanding? Yes  Patient able to self-manage and ready to comply?Yes  Educational resources identified? Verbal Counseling          5. Class 2 severe obesity due to excess calories with serious comorbidity and body mass index (BMI) of 38.0 to 38.9 in adult   I have reviewed the patient's medical history in detail and updated the computerized patient record.  Discussed general issues about obesity pathophysiology and management.  Discussed exercise management and diet with emphasis on vegetables, fruit and lean meat.  Are barriers to achieving goals present? No  Patient able to self-manage and ready to comply? Yes  Educational resources identified? Verbal Counseling

## 2022-01-31 ENCOUNTER — Encounter: Admit: 2022-01-31 | Discharge: 2022-01-31 | Payer: MEDICARE

## 2022-01-31 DIAGNOSIS — E119 Type 2 diabetes mellitus without complications: Secondary | ICD-10-CM

## 2022-02-09 ENCOUNTER — Encounter: Admit: 2022-02-09 | Discharge: 2022-02-09 | Payer: MEDICARE

## 2022-02-14 ENCOUNTER — Encounter: Admit: 2022-02-14 | Discharge: 2022-02-14 | Payer: MEDICARE

## 2022-02-15 ENCOUNTER — Encounter: Admit: 2022-02-15 | Discharge: 2022-02-15 | Payer: MEDICARE

## 2022-02-15 ENCOUNTER — Ambulatory Visit: Admit: 2022-02-15 | Discharge: 2022-02-15 | Payer: MEDICARE

## 2022-02-15 DIAGNOSIS — M5116 Intervertebral disc disorders with radiculopathy, lumbar region: Secondary | ICD-10-CM

## 2022-02-20 ENCOUNTER — Encounter: Admit: 2022-02-20 | Discharge: 2022-02-20 | Payer: MEDICARE

## 2022-02-20 MED ORDER — METOPROLOL SUCCINATE 50 MG PO TB24
50 mg | ORAL_TABLET | Freq: Every day | ORAL | 3 refills | 90.00000 days | Status: AC
Start: 2022-02-20 — End: ?

## 2022-02-27 ENCOUNTER — Encounter: Admit: 2022-02-27 | Discharge: 2022-02-27 | Payer: MEDICARE

## 2022-02-27 ENCOUNTER — Ambulatory Visit: Admit: 2022-02-27 | Discharge: 2022-02-27 | Payer: MEDICARE

## 2022-02-27 DIAGNOSIS — Z9689 Presence of other specified functional implants: Secondary | ICD-10-CM

## 2022-02-27 DIAGNOSIS — M4800 Spinal stenosis, site unspecified: Secondary | ICD-10-CM

## 2022-02-27 DIAGNOSIS — T148XXA Other injury of unspecified body region, initial encounter: Secondary | ICD-10-CM

## 2022-02-27 DIAGNOSIS — M2550 Pain in unspecified joint: Secondary | ICD-10-CM

## 2022-02-27 DIAGNOSIS — M1990 Unspecified osteoarthritis, unspecified site: Secondary | ICD-10-CM

## 2022-02-27 DIAGNOSIS — J189 Pneumonia, unspecified organism: Secondary | ICD-10-CM

## 2022-02-27 DIAGNOSIS — I7121 Ascending aortic aneurysm (HCC): Secondary | ICD-10-CM

## 2022-02-27 DIAGNOSIS — M5136 Other intervertebral disc degeneration, lumbar region: Secondary | ICD-10-CM

## 2022-02-27 DIAGNOSIS — G5602 Carpal tunnel syndrome, left upper limb: Secondary | ICD-10-CM

## 2022-02-27 DIAGNOSIS — G609 Hereditary and idiopathic neuropathy, unspecified: Secondary | ICD-10-CM

## 2022-02-27 DIAGNOSIS — E785 Hyperlipidemia, unspecified: Secondary | ICD-10-CM

## 2022-02-27 DIAGNOSIS — K760 Fatty (change of) liver, not elsewhere classified: Secondary | ICD-10-CM

## 2022-02-27 DIAGNOSIS — G971 Other reaction to spinal and lumbar puncture: Secondary | ICD-10-CM

## 2022-02-27 DIAGNOSIS — M5416 Radiculopathy, lumbar region: Secondary | ICD-10-CM

## 2022-02-27 DIAGNOSIS — R011 Cardiac murmur, unspecified: Secondary | ICD-10-CM

## 2022-02-27 DIAGNOSIS — H269 Unspecified cataract: Secondary | ICD-10-CM

## 2022-02-27 DIAGNOSIS — M5116 Intervertebral disc disorders with radiculopathy, lumbar region: Secondary | ICD-10-CM

## 2022-02-27 DIAGNOSIS — I351 Nonrheumatic aortic (valve) insufficiency: Secondary | ICD-10-CM

## 2022-02-27 DIAGNOSIS — M48062 Spinal stenosis, lumbar region with neurogenic claudication: Secondary | ICD-10-CM

## 2022-02-27 DIAGNOSIS — E669 Obesity, unspecified: Secondary | ICD-10-CM

## 2022-02-27 DIAGNOSIS — I428 Other cardiomyopathies: Secondary | ICD-10-CM

## 2022-02-27 DIAGNOSIS — N529 Male erectile dysfunction, unspecified: Secondary | ICD-10-CM

## 2022-02-27 DIAGNOSIS — I2699 Other pulmonary embolism without acute cor pulmonale: Secondary | ICD-10-CM

## 2022-02-27 DIAGNOSIS — R768 Other specified abnormal immunological findings in serum: Secondary | ICD-10-CM

## 2022-02-27 DIAGNOSIS — I493 Ventricular premature depolarization: Secondary | ICD-10-CM

## 2022-02-27 DIAGNOSIS — I10 Essential (primary) hypertension: Secondary | ICD-10-CM

## 2022-02-27 DIAGNOSIS — I2510 Atherosclerotic heart disease of native coronary artery without angina pectoris: Secondary | ICD-10-CM

## 2022-02-27 DIAGNOSIS — D689 Coagulation defect, unspecified: Secondary | ICD-10-CM

## 2022-02-27 NOTE — Progress Notes
[facility] Inter[facility]    Subjective     Chief Complaint: Pain  No chief complaint on file.      HPI: Victor Graham is a 71 y.o. 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 who presents for evaluation.    MRI review    LBP  present for years  lower back  rarely will radiate to bilateral buttock  worse with bending, standing or walking   Alleviated by sitting or rest  Reports BLE weakness  VAS 2-8/10      Reports about 40% relief with SCS  Only has one lead         PRIOR MEDICATIONS:   Effective  Lyrica  NSAID  Acetaminophen  Tizanidine    Ineffective  Gabapentin     Unable to tolerate      Never  Ami/Nortriptyline  Cymbalta  Tizanidine      PRIOR INTERVENTIONS:  Lumbar fusion 2009  Effective  TFESI - transient  SCS    Ineffective  Lumbar RFA        Past Medical History:  Medical History:   Diagnosis Date   ? Arthritis    ? Ascending aortic aneurysm (HCC) 01/08/2008   ? Asymptomatic PVCs 03/11/2015    03/2015 per pt, has had them for a long time & does not feel them usually    ? Blood clotting disorder (HCC) 05 Apr 2017    hex lupus anticoagulant   ? CAD (coronary artery disease)    ? Carpal tunnel syndrome of left wrist     emg by Dr Lynnae Sandhoff at [facility] shows severe median neuropathy   ? Cataract     Cateract Surgery Dec 2022   ? Degenerative disc disease, lumbar    ? Dyslipidemia    ? Erectile dysfunction    ? Fatty liver    ? Heart murmur Nov 2020   ? HLD (hyperlipidemia) 01/08/2008   ? Hypertension 01/08/2008   ? Idiopathic polyneuropathy    ? Joint pain Jul 2008   ? Lumbar radiculopathy    ? Nerve injury    ? NICM (nonischemic cardiomyopathy) (HCC)    ? 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 Obesity    ? Pneumonia 2008   ? Positive ANA (antinuclear antibody) 02/03/2016   ? Positive ANA (antinuclear antibody) 02/03/2016   ? Pulmonary embolism [facility]) 2015 -- july 20th    takes xaralto anticoagulant -- treated at [facility]   ? Spinal headache May 1993   ? Spinal stenosis 04 Sep 2015       Family History:  Family History   Problem Relation Age of Onset   ? DVT Mother    ? Arthritis Mother    ? Joint Pain Mother    ? Cancer Father         Prostate cancer   ?  DVT Brother    ? Stroke Paternal Grandfather    ? Cancer Paternal Grandmother    ? Arthritis Paternal Grandmother    ? Stroke Maternal Grandfather    ? Cancer Maternal Grandmother         Colon Cancer       Social History:  Lives in [place] [id number]    Social History     Socioeconomic History   ? Marital status: Married   Tobacco Use   ? Smoking status: Former     Packs/day: 2.00     Years: 15.00     Additional pack years: 0.00     Total pack years: 30.00     Types: Cigarettes     Quit date: 08/24/1991     Years since quitting: 30.5   ? Smokeless tobacco: Never   Substance and Sexual Activity   ? Alcohol use: No   ? Drug use: No   ? Sexual activity: Not Currently     Partners: Female     Birth control/protection: None   Other Topics Concern   ? Financial planner Yes     CommentPersonnel officer, [employer]       Allergies:  Allergies   Allergen Reactions   ? Atorvastatin FLUSHING (SKIN), SEE COMMENTS and HIVES rash     ? Niacin FLUSHING (SKIN) and SEE COMMENTS     Other reaction(s): Red flush, itching  Other reaction(s): itching/redness   ? Other [Unclassified Drug] BLISTERS     dissolving stiches in mouth after dental surgery caused blisters   ? Rubber SEE COMMENTS     Neoprene rubber causes contact dermatitis (CAUSED BY THIOUREA IN NEOPRENE)       Medications:    Current Outpatient Medications:   ?  dulaglutide (TRULICITY) 1.5 mg/0.5 mL injection pen, Inject 0.5 mL under the skin every 7 days., Disp: 6 mL, Rfl: 1  ?  ezetimibe (ZETIA) 10 mg tablet, Take one tablet by mouth daily., Disp: 90 tablet, Rfl: 3  ?  furosemide (LASIX) 20 mg tablet, Take one tablet by mouth daily as needed., Disp: 90 tablet, Rfl: 1  ?  GLUCOSAMINE HCL PO, Take 1,000 mg by mouth twice daily., Disp: , Rfl:   ?  lisinopriL (ZESTRIL) 10 mg tablet, Take one tablet by mouth daily., Disp: 90 tablet, Rfl: 3  ?  meloxicam (MOBIC) 15 mg tablet, Take one-half tablet to one tablet by mouth daily as needed., Disp: 90 tablet, Rfl: 3  ?  metFORMIN (GLUCOPHAGE) 1,000 mg tablet, Take one tablet by mouth twice daily with meals., Disp: 180 tablet, Rfl: 3  ?  metoprolol succinate XL (TOPROL XL) 50 mg extended release tablet, Take one tablet by mouth daily. Take 25 mg daily for one week then 50 mg daily, Disp: 90 tablet, Rfl: 3  ?  pregabalin (LYRICA) 200 mg capsule, Take one capsule by mouth twice daily., Disp: 180 capsule, Rfl: 3  ?  rivaroxaban (XARELTO) 20 mg tablet, Take one tablet by mouth daily with dinner., Disp: 90 tablet, Rfl: 2  ?  rosuvastatin (CRESTOR) 20 mg tablet, Take one tablet by mouth daily., Disp: 90 tablet, Rfl: 1  ?  SAW PALMETTO PO, Take  by mouth., Disp: , Rfl:   ?  spironolactone (ALDACTONE) 25 mg tablet, Take one tablet by mouth daily., Disp: 90 tablet, Rfl: 0  ?  tiZANidine (ZANAFLEX) 2 mg tablet, Take two tablets by mouth three times daily., Disp: 60 tablet, Rfl: 0  ?  traMADoL (ULTRAM) 50 mg tablet, Take one tablet by mouth every 8 hours as needed., Disp: 90 tablet, Rfl: 2  ?  triamcinolone acetonide (KENALOG) 0.1 % topical ointment, as Needed., Disp: , Rfl:     Physical examination:   BP 130/81  - Ht 177.8 cm (5' 10)  - Wt 120.2 kg (265 lb)  - SpO2 99%  - BMI 38.02 kg/m?      Oswestry Total Score:: 46    Physical Exam:  General: The patient is a well-developed, well nourished 71 y.o. male in no acute distress.   HEENT: Head is normocephalic and atraumatic. Pupils are equal and reactive to light bilaterally.   Cardiac: Based on palpation, pulse appears to be regular rate and rhythm.   Pulmonary: The patient has unlabored respirations and bilateral symmetric chest excursion.   Abdomen: Soft, nontender, and nondistended.   Extremities: No clubbing, cyanosis, or edema.     Neurologic:   The patient is alert and oriented times 3.       Musculoskeletal:   Gait is antalgic     Lumbar spine:  Appearance: No lesions or deformity  Lumbar tenderness: moderate tenderness, increased paraspinal muscle tone  SI joint tenderness: None  Pain with extension: Yes  Facet loading positive  Pain with lateral flexion: None  FABER positive?: None  Lower extremity strength: 5/5 bilaterally  Sensation to light touch: Intact and equal in the bilateral lower extremities  Straight leg raise positive?: Bilateral      MS:   Root Right Left   Hip Flexion L2 4 5   Knee Flexion L5/S1 5 5   Knee Extension L3 4 5   Dorsiflexion L4 5 5   Plantarflexion S1 5 5   EHL Extension L5 5 5       MRI L-spine 02/15/2022  FINDINGS:     Reported spinal stimulator lead migration and interval revision, poorly   evaluated on the current imaging modality. Prior L4-5 posterior spinal   fusion with associated signal distortion. Stable mild anterolisthesis at   L4-L5 and mild retrolisthesis at L1-2 and L2-L3. Left lateral listhesis   L3-L4 and a left convexity lumbar curvature. Chronic mild degenerative   vertebral body height loss and multilevel degenerative marrow endplate   changes along the inner scoliotic curvature.     The conus is normal in position and appearance at the L1 level.     T11-T12: Likely at least moderate central spinal stenosis, suboptimally   evaluated due to exclusion on the axial field of view.     T12-L1: Disc bulge and facet hypertrophy results in mild to moderate   trefoil type central spinal stenosis and mild bilateral foraminal   stenosis.     L1-L2: Retrolisthesis, degenerative disc disease, disc osteophyte complex,   facet hypertrophy, and ligamentum flavum thickening results in   moderate-severe trefoil type central spinal stenosis and moderate   bilateral foraminal stenosis.     L2-L3: Retrolisthesis, degenerative disc disease and eccentric right-sided   disc osteophyte complex, facet hypertrophy, and ligamentum flavum   thickening results in moderate-severe trefoil type central spinal stenosis   and severe right and at least moderate left foraminal stenosis.     L3-L4: Marked degenerative disc disease and circumferential disc   osteophyte complex, facet hypertrophy, and ligamentum flavum thickening   results in severe trefoil type central spinal stenosis and severe right   and at least moderate left foraminal stenosis.     L4-L5: No obvious bony compromise  of the spinal canal or foramina.   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     L5-S1: Circumferential disc osteophyte complex and facet hypertrophy   results in severe bilateral foraminal stenosis and at least mild bilateral   lateral recess stenosis.     Marked right iliopsoas and bilateral gluteal (left greater than right)   muscular atrophy.     IMPRESSION       1. ?Prior L4-L5 posterior spinal fusion and reported spinal stimulator   lead migration and interval revision, poorly evaluated on the current   imaging modality.   2. ?Multilevel degenerative trefoil type central spinal stenosis most   pronounced of severe degree at L3-L4 and moderate to severe degree at   L1-L2 and L2-L3.   3. ?Multilevel degenerative foraminal stenosis most pronounced of severe   degree on the right at L2-L3 and L3-4 and bilaterally at L5-S1.   4. ?Chronic multilevel spondylolisthesis and left convexity lumbar   curvature.   5. 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   6. ?Marked right iliopsoas and bilateral gluteal (left greater than right)   muscular atrophy.       Last Cr and LFT's:  Creatinine   Date Value Ref Range Status   10/30/2021 0.72 0.4 - 1.24 MG/DL Final     AST (SGOT)   Date Value Ref Range Status   10/30/2021 42 (H) 7 - 40 U/L Final     ALT (SGPT)   Date Value Ref Range Status   10/30/2021 52 7 - 56 U/L Final     Alk Phosphatase   Date Value Ref Range Status   10/30/2021 32 25 - 110 U/L Final     Total Bilirubin   Date Value Ref Range Status   10/30/2021 1.5 (H) 0.3 - 1.2 MG/DL Final          Assessment:    Victor Graham is a 71 y.o. 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 who presents for evaluation of pain.    The pain complaints are most likely due to:    1. Spinal stenosis of lumbar region with neurogenic claudication  AMB REFERRAL TO PHYSICAL THERAPY    AMB REFERRAL TO SPINE CENTER      2. Spinal cord stimulator status  SPINE THORACOLUMBAR JUNCTION 2V      3. Lumbar disc disease with radiculopathy  [place] AMB SPINE TRANSFORAMINAL LUMBAR/SACRAL          Patient has had an adequate trial of > 12 month of rest, exercise, multimodal treatment, and the passage of time without improvement of symptoms. The pain has significant impact on the daily quality of life.     Plan:      1.Discussed care options with patient. Plan referral to surgery  2. Restart formal PT  3. Plan on bilateral L3-L4 TFESI  This patient's clinical history, exam, AND imaging support radiculopathy AND there is a significant impact on quality of life and function AND the pain has been present for at least 4 weeks AND they have failed to improve with noninvasive conservative care.   4. Continue tizanidine  5. Abbot lead - migration out of spine space.  Will turn off for now.  Consider revision with neurosurgery.   6. Follow up after injection        Risks/benefits of all pharmacologic and interventional treatments discussed and questions answered.

## 2022-03-15 ENCOUNTER — Encounter: Admit: 2022-03-15 | Discharge: 2022-03-15 | Payer: MEDICARE

## 2022-03-20 ENCOUNTER — Encounter: Admit: 2022-03-20 | Discharge: 2022-03-20 | Payer: MEDICARE

## 2022-03-28 ENCOUNTER — Encounter: Admit: 2022-03-28 | Discharge: 2022-03-28 | Payer: MEDICARE

## 2022-03-28 MED ORDER — PREGABALIN 200 MG PO CAP
200 mg | ORAL_CAPSULE | Freq: Two times a day (BID) | ORAL | 3 refills | Status: AC
Start: 2022-03-28 — End: ?

## 2022-03-28 NOTE — Telephone Encounter
Received refill request for Lyrica. Medication set forth in plan at [facility] 09/14/21. Medication refill routed to Dr. Rickard Patience for approval.

## 2022-04-06 ENCOUNTER — Encounter: Admit: 2022-04-06 | Discharge: 2022-04-06 | Payer: MEDICARE

## 2022-04-06 DIAGNOSIS — C61 Malignant neoplasm of prostate: Secondary | ICD-10-CM

## 2022-04-06 NOTE — Progress Notes
04/06/2022 7:28 AMVisit note from [place] sent for scanning

## 2022-04-12 ENCOUNTER — Encounter: Admit: 2022-04-12 | Discharge: 2022-04-12 | Payer: MEDICARE

## 2022-04-30 ENCOUNTER — Encounter: Admit: 2022-04-30 | Discharge: 2022-04-30 | Payer: MEDICARE

## 2022-04-30 ENCOUNTER — Ambulatory Visit: Admit: 2022-04-30 | Discharge: 2022-04-30 | Payer: MEDICARE

## 2022-04-30 DIAGNOSIS — C61 Malignant neoplasm of prostate: Secondary | ICD-10-CM

## 2022-04-30 LAB — CBC AND DIFF
ABSOLUTE BASO COUNT: 0 K/UL (ref 0–0.20)
ABSOLUTE LYMPH COUNT: 1.9 K/UL (ref 1.0–4.8)
ABSOLUTE MONO COUNT: 0.4 K/UL (ref 0–0.80)
ABSOLUTE NEUTROPHIL: 2.6 K/UL (ref 1.8–7.0)
BASOPHILS %: 1 % (ref 0–2)
EOSINOPHILS %: 2 % (ref >60)
HEMATOCRIT: 46 % (ref 40–50)
LYMPHOCYTES %: 37 % (ref 24–44)
MCH: 29 pg (ref 26–34)
MCHC: 32 g/dL (ref 32.0–36.0)
MONOCYTES %: 9 % (ref 4–12)
MPV: 9.1 FL (ref 7–11)
NEUTROPHILS %: 51 % (ref 41–77)
PLATELET COUNT: 144 K/UL — ABNORMAL LOW (ref 150–400)
RBC COUNT: 5.1 M/UL — ABNORMAL HIGH (ref 4.4–5.5)
RDW: 15 % (ref 11–15)
WBC COUNT: 5.3 K/UL (ref 4.5–11.0)

## 2022-04-30 LAB — COMPREHENSIVE METABOLIC PANEL
POTASSIUM: 4.4 MMOL/L (ref 3.5–5.1)
SODIUM: 137 MMOL/L (ref 137–147)

## 2022-04-30 LAB — PROSTATIC SPECIFIC ANTIGEN-PSA: PROSTATIC SPEC AG: 4.9 ng/mL (ref <6.01)

## 2022-04-30 LAB — TESTOSTERONE,TOTAL: TESTOSTERONE, TOTAL: 38 ng/dL — ABNORMAL LOW (ref 270–1070)

## 2022-05-01 ENCOUNTER — Encounter: Admit: 2022-05-01 | Discharge: 2022-05-01 | Payer: MEDICARE

## 2022-05-01 ENCOUNTER — Ambulatory Visit: Admit: 2022-05-01 | Discharge: 2022-05-02 | Payer: MEDICARE

## 2022-05-01 DIAGNOSIS — G834 Cauda equina syndrome: Secondary | ICD-10-CM

## 2022-05-03 ENCOUNTER — Encounter: Admit: 2022-05-03 | Discharge: 2022-05-03 | Payer: MEDICARE

## 2022-05-07 ENCOUNTER — Encounter: Admit: 2022-05-07 | Discharge: 2022-05-07 | Payer: MEDICARE

## 2022-05-07 ENCOUNTER — Ambulatory Visit: Admit: 2022-05-07 | Discharge: 2022-05-08 | Payer: MEDICARE

## 2022-05-07 DIAGNOSIS — N529 Male erectile dysfunction, unspecified: Secondary | ICD-10-CM

## 2022-05-07 DIAGNOSIS — G971 Other reaction to spinal and lumbar puncture: Secondary | ICD-10-CM

## 2022-05-07 DIAGNOSIS — I493 Ventricular premature depolarization: Secondary | ICD-10-CM

## 2022-05-07 DIAGNOSIS — H269 Unspecified cataract: Secondary | ICD-10-CM

## 2022-05-07 DIAGNOSIS — R011 Cardiac murmur, unspecified: Secondary | ICD-10-CM

## 2022-05-07 DIAGNOSIS — K760 Fatty (change of) liver, not elsewhere classified: Secondary | ICD-10-CM

## 2022-05-07 DIAGNOSIS — E785 Hyperlipidemia, unspecified: Secondary | ICD-10-CM

## 2022-05-07 DIAGNOSIS — G5602 Carpal tunnel syndrome, left upper limb: Secondary | ICD-10-CM

## 2022-05-07 DIAGNOSIS — M2550 Pain in unspecified joint: Secondary | ICD-10-CM

## 2022-05-07 DIAGNOSIS — I10 Essential (primary) hypertension: Secondary | ICD-10-CM

## 2022-05-07 DIAGNOSIS — J189 Pneumonia, unspecified organism: Secondary | ICD-10-CM

## 2022-05-07 DIAGNOSIS — M1990 Unspecified osteoarthritis, unspecified site: Secondary | ICD-10-CM

## 2022-05-07 DIAGNOSIS — T148XXA Other injury of unspecified body region, initial encounter: Secondary | ICD-10-CM

## 2022-05-07 DIAGNOSIS — I428 Other cardiomyopathies: Secondary | ICD-10-CM

## 2022-05-07 DIAGNOSIS — I2699 Other pulmonary embolism without acute cor pulmonale: Secondary | ICD-10-CM

## 2022-05-07 DIAGNOSIS — D689 Coagulation defect, unspecified: Secondary | ICD-10-CM

## 2022-05-07 DIAGNOSIS — I351 Nonrheumatic aortic (valve) insufficiency: Secondary | ICD-10-CM

## 2022-05-07 DIAGNOSIS — I2510 Atherosclerotic heart disease of native coronary artery without angina pectoris: Secondary | ICD-10-CM

## 2022-05-07 DIAGNOSIS — M5136 Other intervertebral disc degeneration, lumbar region: Secondary | ICD-10-CM

## 2022-05-07 DIAGNOSIS — M4800 Spinal stenosis, site unspecified: Secondary | ICD-10-CM

## 2022-05-07 DIAGNOSIS — M5416 Radiculopathy, lumbar region: Secondary | ICD-10-CM

## 2022-05-07 DIAGNOSIS — G609 Hereditary and idiopathic neuropathy, unspecified: Secondary | ICD-10-CM

## 2022-05-07 DIAGNOSIS — R768 Other specified abnormal immunological findings in serum: Secondary | ICD-10-CM

## 2022-05-07 DIAGNOSIS — E669 Obesity, unspecified: Secondary | ICD-10-CM

## 2022-05-07 DIAGNOSIS — I7121 Ascending aortic aneurysm (HCC): Secondary | ICD-10-CM

## 2022-05-07 NOTE — Progress Notes
Date of Service: 05/07/2022    Victor Graham is a 72 y.o. male.  DOB: 10-Mar-1951  MRN: [id number]     Subjective:               Patient has a known history of DM, HTN, HLD and obesity.  He has been working on diet and exercise and tolerating meds well.  He has had some neck pain and stiffness but is also working with Neurology with regards to lumbar radiculopathy.      Diet adherence:most of the time   Medication adherence:most of the time     Diabetes Management:  The patient has not had hypoglycemic reactions  Lab Results   Component Value Date/Time    HGBA1C 8.5 (H) 10/30/2021 10:51 AM    HGBA1C 6.9 (H) 10/24/2020 10:42 AM    HGBA1C 6.9 (H) 06/01/2020 02:06 PM    A1C 6.9 (A) 05/07/2022 12:00 AM    HGBPOC 12.9 (L) 07/07/2008 11:49 AM    CHOL 106 10/30/2021 10:51 AM    TRIG 220 (H) 10/30/2021 10:51 AM    HDL 35 (L) 10/30/2021 10:51 AM    LDL 46 10/30/2021 10:51 AM    VLDL 44 10/30/2021 10:51 AM    NONHDLCHOL 71 10/30/2021 10:51 AM    CR 0.67 04/30/2022 11:07 AM    MCALBR 9.7 11/15/2021 10:36 AM    ALT 38 04/30/2022 11:07 AM    CK 255 (H) 01/06/2019 03:47 PM    VITD25 46.5 10/20/2019 09:03 AM        Microalbumin tested in last 12 months?  Yes  Eye exam within the last 12 months? Yes  Comprehensive Foot exam within the last 12 months? Yes  Pneumonia shot current? Yes  The patient is taking an ACE inhibitor or an ARB:Yes   The patient is taking a statin:Yes    Hypertension Management:    Outside blood pressures being performed: Yes  BP Readings from Last 3 Encounters:   05/07/22 105/71   05/01/22 134/74   02/27/22 130/81     He denies significant light-headedness.    Hyperlipidemia Management  Side effects to medications? No                   Assessment and Plan:  1. Type 2 diabetes mellitus with hyperglycemia, without long-term current use of insulin (HCC)  Diabetes Mellitus    Plan:   Discussed general issues about diabetes pathophysiology and management.  Discussed exercise management and diet with emphasis on vegetables, fruit and lean meat.  Discussed foot care.  Reminded to get retinal exam annually and dental appointment every 6 months.  Treatment goals: A1C < or = 7.0       BP <140/90  Are barriers to achieving goals present? No  Medication education provided. Patient voiced understanding? Yes  Patient able to self-manage and ready to comply? Yes  Educational resources identified? Verbal Counseling  - will decrease his  metformin to 1 a day and keep his trulicity at the same dosage.  He is going to return in 3 months for recheck and if his HgbA1c remains good we can consider increasing the trulicity and/or stopping the metformin.  - I reviewed with the patient their current medications and specifically any new medications prescribed at the time of this visit and we reviewed the expected benefits and potential side effects. All questions are answered to the patient's satisfaction.   - POC HEMOGLOBIN A1C    2. Essential hypertension  Hypertension    Plan:   Discussed hypertension and reviewed goals.  Are barriers to achieving goals present? No  Medication education provided. Patient voiced understanding? Yes  Patient able to self-manage and ready to comply? Yes  Educational resources identified? Verbal Counseling     3. Hyperlipidemia, unspecified hyperlipidemia type  Hyperlipidemia    Plan:  Discussed labs and reviewed goals for LDL, HDL, triglycerides.   Discussed exercise management and diet with emphasis on vegetables, fruit and lean meat.  Are barriers to achieving goals present? No  Medication education provided. Patient voiced understanding? Yes  Patient able to self-manage and ready to comply?Yes  Educational resources identified? Verbal Counseling        4. Class 2 severe obesity due to excess calories with serious comorbidity and body mass index (BMI) of 38.0 to 38.9 in adult   I have reviewed the patient's medical history in detail and updated the computerized patient record.  Discussed general issues about obesity pathophysiology and management.  Discussed exercise management and diet with emphasis on vegetables, fruit and lean meat.  Educational resources identified? Verbal Counseling                     Review of Systems   Constitutional:  Negative for fatigue.   Cardiovascular:  Negative for chest pain.   Endocrine: Positive for polyuria. Negative for polydipsia and polyphagia.   Skin:  Negative for pallor.   Neurological:  Negative for dizziness, tremors, seizures, speech difficulty, weakness and headaches.   Psychiatric/Behavioral:  Negative for confusion. The patient is not nervous/anxious.          Objective:          dulaglutide (TRULICITY) 1.5 mg/0.5 mL injection pen Inject 0.5 mL under the skin every 7 days.    ezetimibe (ZETIA) 10 mg tablet Take one tablet by mouth daily.    furosemide (LASIX) 20 mg tablet Take one tablet by mouth daily as needed.    GLUCOSAMINE HCL PO Take 1,000 mg by mouth twice daily.    lisinopriL (ZESTRIL) 10 mg tablet Take one tablet by mouth daily.    meloxicam (MOBIC) 15 mg tablet Take one-half tablet to one tablet by mouth daily as needed.    metFORMIN (GLUCOPHAGE) 1,000 mg tablet Take one tablet by mouth twice daily with meals.    metoprolol succinate XL (TOPROL XL) 50 mg extended release tablet Take one tablet by mouth daily. Take 25 mg daily for one week then 50 mg daily    pregabalin (LYRICA) 200 mg capsule Take one capsule by mouth twice daily.    rivaroxaban (XARELTO) 20 mg tablet Take one tablet by mouth daily with dinner.    rosuvastatin (CRESTOR) 20 mg tablet Take one tablet by mouth daily.    spironolactone (ALDACTONE) 25 mg tablet Take one tablet by mouth daily.    tiZANidine (ZANAFLEX) 2 mg tablet Take two tablets by mouth three times daily.    traMADoL (ULTRAM) 50 mg tablet Take one tablet by mouth every 8 hours as needed.     Vitals:    05/07/22 0947   BP: 105/71   BP Source: Arm, Right Upper   Pulse: 70   Temp: 36.5 ?C (97.7 ?F)   SpO2: 97%   TempSrc: Temporal   PainSc: Zero   Weight: 120.4 kg (265 lb 6.4 oz)     Body mass index is 38.08 kg/m?Marland Kitchen     Physical Exam  Vitals reviewed.   Constitutional:  Appearance: Normal appearance. He is well-developed.   HENT:      Head: Normocephalic and atraumatic.      Nose: Nose normal.   Eyes:      Conjunctiva/sclera: Conjunctivae normal.   Cardiovascular:      Rate and Rhythm: Normal rate.   Pulmonary:      Effort: Pulmonary effort is normal.   Skin:     General: Skin is warm.      Capillary Refill: Capillary refill takes less than 2 seconds.      Findings: No rash.   Neurological:      Mental Status: He is alert.      Coordination: Coordination normal.   Psychiatric:         Behavior: Behavior normal.         Thought Content: Thought content normal.         Judgment: Judgment normal.

## 2022-05-08 DIAGNOSIS — E1165 Type 2 diabetes mellitus with hyperglycemia: Secondary | ICD-10-CM

## 2022-05-10 ENCOUNTER — Encounter: Admit: 2022-05-10 | Discharge: 2022-05-10 | Payer: MEDICARE

## 2022-05-10 ENCOUNTER — Ambulatory Visit: Admit: 2022-05-10 | Discharge: 2022-05-11 | Payer: MEDICARE

## 2022-05-10 DIAGNOSIS — I2510 Atherosclerotic heart disease of native coronary artery without angina pectoris: Secondary | ICD-10-CM

## 2022-05-10 DIAGNOSIS — M5416 Radiculopathy, lumbar region: Secondary | ICD-10-CM

## 2022-05-10 DIAGNOSIS — E785 Hyperlipidemia, unspecified: Secondary | ICD-10-CM

## 2022-05-10 DIAGNOSIS — M2550 Pain in unspecified joint: Secondary | ICD-10-CM

## 2022-05-10 DIAGNOSIS — G609 Hereditary and idiopathic neuropathy, unspecified: Secondary | ICD-10-CM

## 2022-05-10 DIAGNOSIS — G5602 Carpal tunnel syndrome, left upper limb: Secondary | ICD-10-CM

## 2022-05-10 DIAGNOSIS — I7121 Ascending aortic aneurysm (HCC): Secondary | ICD-10-CM

## 2022-05-10 DIAGNOSIS — R011 Cardiac murmur, unspecified: Secondary | ICD-10-CM

## 2022-05-10 DIAGNOSIS — M1990 Unspecified osteoarthritis, unspecified site: Secondary | ICD-10-CM

## 2022-05-10 DIAGNOSIS — T148XXA Other injury of unspecified body region, initial encounter: Secondary | ICD-10-CM

## 2022-05-10 DIAGNOSIS — I2699 Other pulmonary embolism without acute cor pulmonale: Secondary | ICD-10-CM

## 2022-05-10 DIAGNOSIS — N529 Male erectile dysfunction, unspecified: Secondary | ICD-10-CM

## 2022-05-10 DIAGNOSIS — I351 Nonrheumatic aortic (valve) insufficiency: Secondary | ICD-10-CM

## 2022-05-10 DIAGNOSIS — H269 Unspecified cataract: Secondary | ICD-10-CM

## 2022-05-10 DIAGNOSIS — R768 Other specified abnormal immunological findings in serum: Secondary | ICD-10-CM

## 2022-05-10 DIAGNOSIS — M5136 Other intervertebral disc degeneration, lumbar region: Secondary | ICD-10-CM

## 2022-05-10 DIAGNOSIS — I428 Other cardiomyopathies: Secondary | ICD-10-CM

## 2022-05-10 DIAGNOSIS — E669 Obesity, unspecified: Secondary | ICD-10-CM

## 2022-05-10 DIAGNOSIS — I493 Ventricular premature depolarization: Secondary | ICD-10-CM

## 2022-05-10 DIAGNOSIS — D689 Coagulation defect, unspecified: Secondary | ICD-10-CM

## 2022-05-10 DIAGNOSIS — I10 Essential (primary) hypertension: Secondary | ICD-10-CM

## 2022-05-10 DIAGNOSIS — M4800 Spinal stenosis, site unspecified: Secondary | ICD-10-CM

## 2022-05-10 DIAGNOSIS — G971 Other reaction to spinal and lumbar puncture: Secondary | ICD-10-CM

## 2022-05-10 DIAGNOSIS — K760 Fatty (change of) liver, not elsewhere classified: Secondary | ICD-10-CM

## 2022-05-10 DIAGNOSIS — J189 Pneumonia, unspecified organism: Secondary | ICD-10-CM

## 2022-05-10 NOTE — Progress Notes
[facility] Inter[facility]    Subjective     Chief Complaint: Pain  Chief Complaint   Patient presents with    Lower Back - Pain    Pain       HPI: Victor Graham is a 72 y.o. 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 who presents for evaluation.    LBP    recent diagnosis of prostate ca  starting radiation soon    LBP  present for years  lower back  rarely will radiate to bilateral buttock  worse with bending, standing or walking   Alleviated by sitting or rest  Reports BLE weakness  VAS 2-8/10    Noticing weakness in RLE    Reports about 40% relief with SCS  Only has one lead  Did not follow up with surgery due to recent ca diagnosis         PRIOR MEDICATIONS:   Effective  Lyrica  NSAID  Acetaminophen  Tizanidine    Ineffective  Gabapentin     Unable to tolerate      Never  Ami/Nortriptyline  Cymbalta  Tizanidine      PRIOR INTERVENTIONS:  Lumbar fusion 2009  Effective  TFESI - transient  SCS    Ineffective  Lumbar RFA        Past Medical History:  Medical History:   Diagnosis Date    Arthritis     Ascending aortic aneurysm (HCC) 01/08/2008    Asymptomatic PVCs 03/11/2015    03/2015 per pt, has had them for a long time & does not feel them usually     Blood clotting disorder (HCC) 05 Apr 2017    hex lupus anticoagulant    CAD (coronary artery disease)     Carpal tunnel syndrome of left wrist     em[facility] by [facility] Lynnae Sandhoff [facility] shows severe median neuropathy    Cataract     Cateract Surgery Dec 2022    Degenerative disc disease, lumbar     Dyslipidemia     Erectile dysfunction     Fatty liver     Heart murmur Nov 2020    HLD (hyperlipidemia) 01/08/2008    Hypertension 01/08/2008    Idiopathic polyneuropathy     Joint pain Jul 2008    Lumbar radiculopathy     Nerve injury     NICM (nonischemic cardiomyopathy) (HCC)     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    09/2015 Mild on prior assessments     Obesity     Pneumonia 2008    Positive ANA (antinuclear antibody) 02/03/2016    Positive ANA (antinuclear antibody) 02/03/2016    Pulmonary embolism (HCC) 2015 -- july 20th    takes xaralto anticoagulant -- treated at [facility]    Spinal headache May 1993    Spinal stenosis 04 Sep 2015       Family History:  Family History  Problem Relation Age of Onset    DVT Mother     Arthritis Mother     Joint Pain Mother     Cancer Father         Prostate cancer    DVT Brother     Stroke Paternal Grandfather     Cancer Paternal Grandmother     Arthritis Paternal Grandmother     Stroke Maternal Grandfather     Cancer Maternal Grandmother         Colon Cancer       Social History:  Lives in [place] [address]    Social History     Socioeconomic History    Marital status: Married   Tobacco Use    Smoking status: Former     Packs/day: 2.00     Years: 15.00     Additional pack years: 0.00     Total pack years: 30.00     Types: Cigarettes     Quit date: 08/24/1991     Years since quitting: 30.7    Smokeless tobacco: Never   Substance and Sexual Activity    Alcohol use: No    Drug use: No    Sexual activity: Not Currently     Partners: Female     Birth control/protection: None   Other Topics Concern    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, [employer]       Allergies:  Allergies   Allergen Reactions    Atorvastatin FLUSHING (SKIN), SEE COMMENTS and HIVES     rash      Niacin FLUSHING (SKIN) and SEE COMMENTS     Other reaction(s): Red flush, itching  Other reaction(s): itching/redness    Other [Unclassified Drug] BLISTERS     dissolving stiches in mouth after dental surgery caused blisters    Rubber SEE COMMENTS     Neoprene rubber causes contact dermatitis (CAUSED BY THIOUREA IN NEOPRENE)       Medications:    Current Outpatient Medications:     dulaglutide (TRULICITY) 1.5 mg/0.5 mL injection pen, Inject 0.5 mL under the skin every 7 days., Disp: 6 mL, Rfl: 0    ezetimibe (ZETIA) 10 mg tablet, Take one tablet by mouth daily., Disp: 90 tablet, Rfl: 3    furosemide (LASIX) 20 mg tablet, Take one tablet by mouth daily as needed., Disp: 90 tablet, Rfl: 1    GLUCOSAMINE HCL PO, Take 1,000 mg by mouth twice daily., Disp: , Rfl:     lisinopriL (ZESTRIL) 10 mg tablet, Take one tablet by mouth daily., Disp: 90 tablet, Rfl: 3    meloxicam (MOBIC) 15 mg tablet, Take one-half tablet to one tablet by mouth daily as needed., Disp: 90 tablet, Rfl: 3    metFORMIN (GLUCOPHAGE) 1,000 mg tablet, Take one tablet by mouth twice daily with meals., Disp: 180 tablet, Rfl: 3    metoprolol succinate XL (TOPROL XL) 50 mg extended release tablet, Take one tablet by mouth daily. Take 25 mg daily for one week then 50 mg daily, Disp: 90 tablet, Rfl: 3    pregabalin (LYRICA) 200 mg capsule, Take one capsule by mouth twice daily., Disp: 180 capsule, Rfl: 3    rivaroxaban (XARELTO) 20 mg tablet, Take one tablet by mouth daily with dinner., Disp: 90 tablet, Rfl: 2    rosuvastatin (CRESTOR) 20 mg tablet, Take one tablet by mouth daily., Disp: 90 tablet, Rfl: 1    spironolactone (ALDACTONE) 25 mg tablet, Take one tablet by mouth daily., Disp:  90 tablet, Rfl: 0    tiZANidine (ZANAFLEX) 2 mg tablet, Take two tablets by mouth three times daily., Disp: 60 tablet, Rfl: 0 traMADoL (ULTRAM) 50 mg tablet, Take one tablet by mouth every 8 hours as needed., Disp: 90 tablet, Rfl: 2    Physical examination:   BP 110/72  - Pulse 76  - Temp 36.6 ?C (97.8 ?F)  - Ht 177.8 cm (5' 10)  - Wt 118.8 kg (262 lb)  - SpO2 98%  - BMI 37.59 kg/m?   Pain Score: Three  Oswestry Total Score:: 46    Physical Exam:  General: The patient is a well-developed, well nourished 72 y.o. male in no acute distress.   HEENT: Head is normocephalic and atraumatic. Pupils are equal and reactive to light bilaterally.   Cardiac: Based on palpation, pulse appears to be regular rate and rhythm.   Pulmonary: The patient has unlabored respirations and bilateral symmetric chest excursion.   Abdomen: Soft, nontender, and nondistended.   Extremities: No clubbing, cyanosis, or edema.     Neurologic:   The patient is alert and oriented times 3.       Musculoskeletal:   Gait is antalgic     Lumbar spine:  Appearance: No lesions or deformity  Lumbar tenderness: moderate tenderness, increased paraspinal muscle tone  SI joint tenderness: None  Pain with extension: Yes  Facet loading positive  Pain with lateral flexion: None  FABER positive?: None  Lower extremity strength: 4/5 bilaterally  Sensation to light touch: Intact and equal in the bilateral lower extremities  Straight leg raise positive?: Bilateral      MS:   Root Right Left   Hip Flexion L2 4 5   Knee Flexion L5/S1 5 5   Knee Extension L3 4 5   Dorsiflexion L4 4 5   Plantarflexion S1 5 5   EHL Extension L5 5 5       MRI L-spine 02/15/2022  FINDINGS:     Reported spinal stimulator lead migration and interval revision, poorly   evaluated on the current imaging modality. Prior L4-5 posterior spinal   fusion with associated signal distortion. Stable mild anterolisthesis at   L4-L5 and mild retrolisthesis at L1-2 and L2-L3. Left lateral listhesis   L3-L4 and a left convexity lumbar curvature. Chronic mild degenerative   vertebral body height loss and multilevel degenerative marrow endplate   changes along the inner scoliotic curvature.     The conus is normal in position and appearance at the L1 level.     T11-T12: Likely at least moderate central spinal stenosis, suboptimally   evaluated due to exclusion on the axial field of view.     T12-L1: Disc bulge and facet hypertrophy results in mild to moderate   trefoil type central spinal stenosis and mild bilateral foraminal   stenosis.     L1-L2: Retrolisthesis, degenerative disc disease, disc osteophyte complex,   facet hypertrophy, and ligamentum flavum thickening results in   moderate-severe trefoil type central spinal stenosis and moderate   bilateral foraminal stenosis.     L2-L3: Retrolisthesis, degenerative disc disease and eccentric right-sided   disc osteophyte complex, facet hypertrophy, and ligamentum flavum   thickening results in moderate-severe trefoil type central spinal stenosis   and severe right and at least moderate left foraminal stenosis.     L3-L4: Marked degenerative disc disease and circumferential disc   osteophyte complex, facet hypertrophy, and ligamentum flavum thickening   results in severe trefoil type central spinal stenosis  and severe right   and at least moderate left foraminal stenosis.     L4-L5: No obvious bony compromise of the spinal canal or foramina.   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     L5-S1: Circumferential disc osteophyte complex and facet hypertrophy   results in severe bilateral foraminal stenosis and at least mild bilateral   lateral recess stenosis.     Marked right iliopsoas and bilateral gluteal (left greater than right)   muscular atrophy.     IMPRESSION       1.  Prior L4-L5 posterior spinal fusion and reported spinal stimulator   lead migration and interval revision, poorly evaluated on the current   imaging modality.   2.  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   3.  Multilevel degenerative foraminal stenosis most pronounced of severe   degree on the right at L2-L3 and L3-4 and bilaterally at L5-S1.   4.  Chronic multilevel spondylolisthesis and left convexity lumbar   curvature.   5.  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   6.  Marked right iliopsoas and bilateral gluteal (left greater than right)   muscular atrophy.       Last Cr and LFT's:  Creatinine   Date Value Ref Range Status   04/30/2022 0.67 0.4 - 1.24 MG/DL Final     AST (SGOT)   Date Value Ref Range Status   04/30/2022 29 7 - 40 U/L Final     ALT (SGPT)   Date Value Ref Range Status   04/30/2022 38 7 - 56 U/L Final     Alk Phosphatase   Date Value Ref Range Status   04/30/2022 25 25 - 110 U/L Final     Total Bilirubin   Date Value Ref Range Status   04/30/2022 1.2 0.3 - 1.2 MG/DL Final          Assessment:    Victor Graham is a 72 y.o. 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 who presents for evaluation of pain.    The pain complaints are most likely due to:    1. Lumbar radicular pain        2. Spinal stenosis of lumbar region with neurogenic claudication              Patient has had an adequate trial of > 12 month of rest, exercise, multimodal treatment, and the passage of time without improvement of symptoms. The pain has significant impact on the daily quality of life.     Plan:      1.Discussed care options with patient. Plan referral to surgery  2. Plan on bilateral L3-L4 TFESI  This patient's clinical history, exam, AND imaging support radiculopathy AND  there is a significant impact on quality of life and function AND the pain has been present for at least 4 weeks AND they have failed to improve with noninvasive conservative care.   3. Continue tizanidine  4. Abbot lead - migration out of spine space.  Will turn off for now.  Consider revision with neurosurgery.   5. Follow up after injection      Risks/benefits of all pharmacologic and interventional treatments discussed and questions answered.

## 2022-05-11 DIAGNOSIS — M48062 Spinal stenosis, lumbar region with neurogenic claudication: Secondary | ICD-10-CM

## 2022-05-14 ENCOUNTER — Encounter: Admit: 2022-05-14 | Discharge: 2022-05-14 | Payer: MEDICARE

## 2022-05-14 DIAGNOSIS — I428 Other cardiomyopathies: Secondary | ICD-10-CM

## 2022-05-14 MED ORDER — SPIRONOLACTONE 25 MG PO TAB
25 mg | ORAL_TABLET | Freq: Every day | ORAL | 1 refills | 90.00000 days | Status: AC
Start: 2022-05-14 — End: ?

## 2022-05-15 ENCOUNTER — Encounter: Admit: 2022-05-15 | Discharge: 2022-05-15 | Payer: MEDICARE

## 2022-05-15 ENCOUNTER — Ambulatory Visit: Admit: 2022-05-15 | Discharge: 2022-05-15 | Payer: MEDICARE

## 2022-05-15 DIAGNOSIS — I7121 Ascending aortic aneurysm (HCC): Secondary | ICD-10-CM

## 2022-05-15 DIAGNOSIS — K760 Fatty (change of) liver, not elsewhere classified: Secondary | ICD-10-CM

## 2022-05-15 DIAGNOSIS — G5602 Carpal tunnel syndrome, left upper limb: Secondary | ICD-10-CM

## 2022-05-15 DIAGNOSIS — I2699 Other pulmonary embolism without acute cor pulmonale: Secondary | ICD-10-CM

## 2022-05-15 DIAGNOSIS — T148XXA Other injury of unspecified body region, initial encounter: Secondary | ICD-10-CM

## 2022-05-15 DIAGNOSIS — E785 Hyperlipidemia, unspecified: Secondary | ICD-10-CM

## 2022-05-15 DIAGNOSIS — M5116 Intervertebral disc disorders with radiculopathy, lumbar region: Secondary | ICD-10-CM

## 2022-05-15 DIAGNOSIS — E669 Obesity, unspecified: Secondary | ICD-10-CM

## 2022-05-15 DIAGNOSIS — I2510 Atherosclerotic heart disease of native coronary artery without angina pectoris: Secondary | ICD-10-CM

## 2022-05-15 DIAGNOSIS — I428 Other cardiomyopathies: Secondary | ICD-10-CM

## 2022-05-15 DIAGNOSIS — I351 Nonrheumatic aortic (valve) insufficiency: Secondary | ICD-10-CM

## 2022-05-15 DIAGNOSIS — R768 Other specified abnormal immunological findings in serum: Secondary | ICD-10-CM

## 2022-05-15 DIAGNOSIS — M5136 Other intervertebral disc degeneration, lumbar region: Secondary | ICD-10-CM

## 2022-05-15 DIAGNOSIS — J189 Pneumonia, unspecified organism: Secondary | ICD-10-CM

## 2022-05-15 DIAGNOSIS — I493 Ventricular premature depolarization: Secondary | ICD-10-CM

## 2022-05-15 DIAGNOSIS — M1990 Unspecified osteoarthritis, unspecified site: Secondary | ICD-10-CM

## 2022-05-15 DIAGNOSIS — M5417 Radiculopathy, lumbosacral region: Secondary | ICD-10-CM

## 2022-05-15 DIAGNOSIS — N529 Male erectile dysfunction, unspecified: Secondary | ICD-10-CM

## 2022-05-15 DIAGNOSIS — G609 Hereditary and idiopathic neuropathy, unspecified: Secondary | ICD-10-CM

## 2022-05-15 DIAGNOSIS — R011 Cardiac murmur, unspecified: Secondary | ICD-10-CM

## 2022-05-15 DIAGNOSIS — M5137 Other intervertebral disc degeneration, lumbosacral region: Secondary | ICD-10-CM

## 2022-05-15 DIAGNOSIS — I10 Essential (primary) hypertension: Secondary | ICD-10-CM

## 2022-05-15 DIAGNOSIS — G971 Other reaction to spinal and lumbar puncture: Secondary | ICD-10-CM

## 2022-05-15 DIAGNOSIS — M5416 Radiculopathy, lumbar region: Secondary | ICD-10-CM

## 2022-05-15 DIAGNOSIS — H269 Unspecified cataract: Secondary | ICD-10-CM

## 2022-05-15 DIAGNOSIS — M4800 Spinal stenosis, site unspecified: Secondary | ICD-10-CM

## 2022-05-15 DIAGNOSIS — M2550 Pain in unspecified joint: Secondary | ICD-10-CM

## 2022-05-15 DIAGNOSIS — D689 Coagulation defect, unspecified: Secondary | ICD-10-CM

## 2022-05-15 MED ORDER — DEXAMETHASONE SODIUM PHOS (PF) 10 MG/ML IJ EPIDURAL SOLN
15 mg | Freq: Once | EPIDURAL | 0 refills | Status: CP
Start: 2022-05-15 — End: ?

## 2022-05-15 MED ORDER — LIDOCAINE (PF) 10 MG/ML (1 %) IJ SOLN
6 mL | Freq: Once | INTRAMUSCULAR | 0 refills | Status: CP
Start: 2022-05-15 — End: ?

## 2022-05-15 MED ORDER — IOHEXOL 240 MG IODINE/ML IV SOLN
1 mL | Freq: Once | EPIDURAL | 0 refills | Status: CP
Start: 2022-05-15 — End: ?

## 2022-05-15 NOTE — Telephone Encounter
Verline Lema, nurse with [facility], called requesting cardiac clearance with Xarelto hold recommendation on 2-12 for procedure needed for prostate cancer.       [place]   Ph. [phone]  Fx. [phone]

## 2022-05-15 NOTE — Progress Notes
I have examined the patient, and there are no significant changes in their condition, from the previous H&P performed on 05/10/22.    Bilateral L3-4 TFESI        [facility]    Subjective     Chief Complaint: Pain  Chief Complaint   Patient presents with    Lower Back - Pain    Pain       HPI: Victor Graham is a 72 y.o. 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 who presents for evaluation.    LBP    recent diagnosis of prostate ca  starting radiation soon    LBP  present for years  lower back  rarely will radiate to bilateral buttock  worse with bending, standing or walking   Alleviated by sitting or rest  Reports BLE weakness  VAS 2-8/10    Noticing weakness in RLE    Reports about 40% relief with SCS  Only has one lead  Did not follow up with surgery due to recent ca diagnosis         PRIOR MEDICATIONS:   Effective  Lyrica  NSAID  Acetaminophen  Tizanidine    Ineffective  Gabapentin     Unable to tolerate      Never  Ami/Nortriptyline  Cymbalta  Tizanidine      PRIOR INTERVENTIONS:  Lumbar fusion 2009  Effective  TFESI - transient  SCS    Ineffective  Lumbar RFA        Past Medical History:  Medical History:   Diagnosis Date    Arthritis     Ascending aortic aneurysm (HCC) 01/08/2008    Asymptomatic PVCs 03/11/2015    03/2015 per pt, has had them for a long time & does not feel them usually     Blood clotting disorder (HCC) 05 Apr 2017    hex lupus anticoagulant    CAD (coronary artery disease)     Carpal tunnel syndrome of left wrist     emg by Dr Lynnae Sandhoff at [facility] shows severe median neuropathy    Cataract     Cateract Surgery Dec 2022    Degenerative disc disease, lumbar     Dyslipidemia     Erectile dysfunction     Fatty liver     Heart murmur Nov 2020    HLD (hyperlipidemia) 01/08/2008    Hypertension 01/08/2008    Idiopathic polyneuropathy     Joint pain Jul 2008    Lumbar radiculopathy     Nerve injury     NICM (nonischemic cardiomyopathy) (HCC)     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    09/2015 Mild on prior assessments     Obesity     Pneumonia 2008    Positive ANA (antinuclear antibody) 02/03/2016    Positive ANA (antinuclear antibody) 02/03/2016    Pulmonary embolism (HCC) 2015 -- july 20th    takes  xaralto anticoagulant -- treated at [facility]    Spinal headache May 1993    Spinal stenosis 04 Sep 2015       Family History:  Family History   Problem Relation Age of Onset    DVT Mother     Arthritis Mother     Joint Pain Mother     Cancer Father         Prostate cancer    DVT Brother     Stroke Paternal Grandfather     Cancer Paternal Grandmother     Arthritis Paternal Grandmother     Stroke Maternal Grandfather     Cancer Maternal Grandmother         Colon Cancer       Social History:  Lives in [place] [address]    Social History     Socioeconomic History    Marital status: Married   Tobacco Use    Smoking status: Former     Packs/day: 2.00     Years: 15.00     Additional pack years: 0.00     Total pack years: 30.00     Types: Cigarettes     Quit date: 08/24/1991     Years since quitting: 30.7    Smokeless tobacco: Never   Substance and Sexual Activity    Alcohol use: No    Drug use: No    Sexual activity: Not Currently     Partners: Female     Birth control/protection: None   Other Topics Concern    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, [employer]       Allergies:  Allergies   Allergen Reactions    Atorvastatin FLUSHING (SKIN), SEE COMMENTS and HIVES     rash      Niacin FLUSHING (SKIN) and SEE COMMENTS     Other reaction(s): Red flush, itching  Other reaction(s): itching/redness    Other [Unclassified Drug] BLISTERS     dissolving stiches in mouth after dental surgery caused blisters    Rubber SEE COMMENTS     Neoprene rubber causes contact dermatitis (CAUSED BY THIOUREA IN NEOPRENE)       Medications:    Current Outpatient Medications:     dulaglutide (TRULICITY) 1.5 mg/0.5 mL injection pen, Inject 0.5 mL under the skin every 7 days., Disp: 6 mL, Rfl: 0    ezetimibe (ZETIA) 10 mg tablet, Take one tablet by mouth daily., Disp: 90 tablet, Rfl: 3    furosemide (LASIX) 20 mg tablet, Take one tablet by mouth daily as needed., Disp: 90 tablet, Rfl: 1    GLUCOSAMINE HCL PO, Take 1,000 mg by mouth twice daily., Disp: , Rfl:     lisinopriL (ZESTRIL) 10 mg tablet, Take one tablet by mouth daily., Disp: 90 tablet, Rfl: 3    meloxicam (MOBIC) 15 mg tablet, Take one-half tablet to one tablet by mouth daily as needed., Disp: 90 tablet, Rfl: 3    metFORMIN (GLUCOPHAGE) 1,000 mg tablet, Take one tablet by mouth twice daily with meals., Disp: 180 tablet, Rfl: 3    metoprolol succinate XL (TOPROL XL) 50 mg extended release tablet, Take one tablet by mouth daily. Take 25 mg daily for one week then 50 mg daily, Disp: 90 tablet, Rfl: 3    pregabalin (LYRICA) 200 mg capsule, Take one capsule by mouth twice daily., Disp: 180 capsule, Rfl: 3    rivaroxaban (XARELTO) 20 mg tablet, Take one tablet by mouth daily with dinner., Disp:  90 tablet, Rfl: 2    rosuvastatin (CRESTOR) 20 mg tablet, Take one tablet by mouth daily., Disp: 90 tablet, Rfl: 1    spironolactone (ALDACTONE) 25 mg tablet, Take one tablet by mouth daily., Disp: 90 tablet, Rfl: 0    tiZANidine (ZANAFLEX) 2 mg tablet, Take two tablets by mouth three times daily., Disp: 60 tablet, Rfl: 0    traMADoL (ULTRAM) 50 mg tablet, Take one tablet by mouth every 8 hours as needed., Disp: 90 tablet, Rfl: 2    Physical examination:   BP 110/72  - Pulse 76  - Temp 36.6 ?C (97.8 ?F)  - Ht 177.8 cm (5' 10)  - Wt 118.8 kg (262 lb)  - SpO2 98%  - BMI 37.59 kg/m?   Pain Score: Three  Oswestry Total Score:: 46    Physical Exam:  General: The patient is a well-developed, well nourished 72 y.o. male in no acute distress.   HEENT: Head is normocephalic and atraumatic. Pupils are equal and reactive to light bilaterally.   Cardiac: Based on palpation, pulse appears to be regular rate and rhythm.   Pulmonary: The patient has unlabored respirations and bilateral symmetric chest excursion.   Abdomen: Soft, nontender, and nondistended.   Extremities: No clubbing, cyanosis, or edema.     Neurologic:   The patient is alert and oriented times 3.       Musculoskeletal:   Gait is antalgic     Lumbar spine:  Appearance: No lesions or deformity  Lumbar tenderness: moderate tenderness, increased paraspinal muscle tone  SI joint tenderness: None  Pain with extension: Yes  Facet loading positive  Pain with lateral flexion: None  FABER positive?: None  Lower extremity strength: 4/5 bilaterally  Sensation to light touch: Intact and equal in the bilateral lower extremities  Straight leg raise positive?: Bilateral      MS:   Root Right Left   Hip Flexion L2 4 5   Knee Flexion L5/S1 5 5   Knee Extension L3 4 5   Dorsiflexion L4 4 5   Plantarflexion S1 5 5   EHL Extension L5 5 5       MRI L-spine 02/15/2022  FINDINGS:     Reported spinal stimulator lead migration and interval revision, poorly   evaluated on the current imaging modality. Prior L4-5 posterior spinal   fusion with associated signal distortion. Stable mild anterolisthesis at   L4-L5 and mild retrolisthesis at L1-2 and L2-L3. Left lateral listhesis   L3-L4 and a left convexity lumbar curvature. Chronic mild degenerative   vertebral body height loss and multilevel degenerative marrow endplate   changes along the inner scoliotic curvature.     The conus is normal in position and appearance at the L1 level.     T11-T12: Likely at least moderate central spinal stenosis, suboptimally   evaluated due to exclusion on the axial field of view.     T12-L1: Disc bulge and facet hypertrophy results in mild to moderate   trefoil type central spinal stenosis and mild bilateral foraminal   stenosis.     L1-L2: Retrolisthesis, degenerative disc disease, disc osteophyte complex,   facet hypertrophy, and ligamentum flavum thickening results in   moderate-severe trefoil type central spinal stenosis and moderate   bilateral foraminal stenosis.     L2-L3: Retrolisthesis, degenerative disc disease and eccentric right-sided   disc osteophyte complex, facet hypertrophy, and ligamentum flavum   thickening results in moderate-severe trefoil type central spinal stenosis   and  severe right and at least moderate left foraminal stenosis.     L3-L4: Marked degenerative disc disease and circumferential disc   osteophyte complex, facet hypertrophy, and ligamentum flavum thickening   results in severe trefoil type central spinal stenosis and severe right   and at least moderate left foraminal stenosis.     L4-L5: No obvious bony compromise of the spinal canal or foramina.   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     L5-S1: Circumferential disc osteophyte complex and facet hypertrophy   results in severe bilateral foraminal stenosis and at least mild bilateral   lateral recess stenosis.     Marked right iliopsoas and bilateral gluteal (left greater than right)   muscular atrophy.     IMPRESSION       1.  Prior L4-L5 posterior spinal fusion and reported spinal stimulator   lead migration and interval revision, poorly evaluated on the current   imaging modality.   2.  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   3.  Multilevel degenerative foraminal stenosis most pronounced of severe   degree on the right at L2-L3 and L3-4 and bilaterally at L5-S1.   4.  Chronic multilevel spondylolisthesis and left convexity lumbar   curvature.   5.  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   6.  Marked right iliopsoas and bilateral gluteal (left greater than right)   muscular atrophy.       Last Cr and LFT's:  Creatinine   Date Value Ref Range Status   04/30/2022 0.67 0.4 - 1.24 MG/DL Final     AST (SGOT)   Date Value Ref Range Status   04/30/2022 29 7 - 40 U/L Final     ALT (SGPT)   Date Value Ref Range Status   04/30/2022 38 7 - 56 U/L Final     Alk Phosphatase   Date Value Ref Range Status   04/30/2022 25 25 - 110 U/L Final     Total Bilirubin   Date Value Ref Range Status   04/30/2022 1.2 0.3 - 1.2 MG/DL Final          Assessment:    Victor Graham is a 72 y.o. 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 who presents for evaluation of pain.    The pain complaints are most likely due to:    1. Lumbar radicular pain        2. Spinal stenosis of lumbar region with neurogenic claudication              Patient has had an adequate trial of > 12 month of rest, exercise, multimodal treatment, and the passage of time without improvement of symptoms. The pain has significant impact on the  daily quality of life.     Plan:      1.Discussed care options with patient. Plan referral to surgery  2. Plan on bilateral L3-L4 TFESI  This patient's clinical history, exam, AND imaging support radiculopathy AND there is a significant impact on quality of life and function AND the pain has been present for at least 4 weeks AND they have failed to improve with noninvasive conservative care.   3. Continue tizanidine  4. Abbot lead - migration out of spine space.  Will turn off for now.  Consider revision with neurosurgery.   5. Follow up after injection      Risks/benefits of all pharmacologic and interventional treatments discussed and questions answered.

## 2022-05-15 NOTE — Discharge Instructions - Supplementary Instructions
GENERAL POST PROCEDURE INSTRUCTIONS  Physician: _________________________________  Procedure Completed Today:  Joint Injection (hip, knee, shoulder)  Cervical Epidural Steroid Injection  Cervical Transforaminal Steroid Injection  Trigger Point Injection  Caudal Epidural Steroid Injection  Piriformis Injection  Pudendal Nerve Block  Other _____________________ Thoracic Epidural Steroid Injection  Lumbar Epidural Steroid Injection  Lumbar Transforaminal Steroid Injection  Facet Joint Injection  Celiac Nerve Block  Sacrococcygeal  Sacroiliac Joint Injection   Important information following your procedure today:  You may drive today     If you had sedation, you may NOT drive today  Rest at home for the next 6 hours.  You may then begin to resume your normal activities.  DO NOT drive any vehicle, operate any power tools, drink alcohol, make any major decisions, or sign any legal documents for the next 12 hours.  Pain relief may not be immediate. It is possible you may even experience an increase in pain during the first 24-48 hours followed by a gradual decrease of your pain.  Though the procedure is generally safe, and complications are rare, we do ask that you be aware of any of the following:  Any swelling, persistent redness, new bleeding or drainage from the site of the injection.  You should not experience a severe headache.  You should not run a fever over 101oF.  New onset of sharp, severe back and or neck pain.  New onset of upper or lower extremity numbness or weakness.  New difficulty controlling bowel or bladder function after injection.  New shortness of breath.  ** If any of these occur, please call to report this occurrence to a nurse at [phone]. If you are calling after 4:00 p.m. or on weekends or holidays, please call [phone] and ask to have the resident physician on call for the physician paged or go to your local emergency room.  You may experience soreness at the injection site. Ice can be applied at 20-minute intervals for the first 24 hours. The following day you may alternate ice with heat if you are experiencing muscle tightness, otherwise continue with ice. Ice works best at decreasing pain. Avoid application of direct heat, hot showers or hot tubs today.  Avoid strenuous activity today. You many resume your regular activities and exercise tomorrow.  Patients with diabetes may see an elevation in blood sugars for 7-10 days after the injection. It is important to pay close attention to your diet, check your blood sugars daily and report extreme elevations to the physician that manages your diabetes.  Patients taking daily blood thinners can resume their regular dose this evening.  It is important that you take all medications ordered by your pain physician. Taking medications as ordered is an important part of your pain care plan. If you cannot continue the medication plan, please notify the physician.    Possible side effects to steroids that may occur:  Flushing or redness of the face  Irritability  Fluid retention  Change in women's menses  Minor headache    If you are unable to keep your upcoming appointment, please notify the [facility] scheduler at [phone] at least 24 hours in advance. If you have questions for the surgery center, call [facility] at [phone].

## 2022-05-15 NOTE — Procedures
Attending Surgeon: Evelina Bucy, MD    Anesthesia: Local    Pre-Procedure Diagnosis:   1. Lumbosacral radiculopathy    2. DDD (degenerative disc disease), lumbosacral    3. Lumbar disc disease with radiculopathy        Post-Procedure Diagnosis:   1. Lumbosacral radiculopathy    2. DDD (degenerative disc disease), lumbosacral    3. Lumbar disc disease with radiculopathy             SNR/TF LMBR/SAC Injection  Procedure: transforaminal epidural    Laterality: bilateral   on 05/15/2022 2:52 PM  Location: lumbar - L3-4      Consent:   Consent obtained: written  Consent given by: patient  Risks discussed: allergic reaction, bleeding, bruising, infection, nerve damage, no change or worsening in pain, reaction to medication, seizure, swelling and weakness  Alternatives discussed: alternative treatment, delayed treatment and no treatment  Discussed with patient the purpose of the treatment/procedure, other ways of treating my condition, including no treatment/ procedure and the risks and benefits of the alternatives. Patient has decided to proceed with treatment/procedure.        Universal Protocol:  Relevant documents: relevant documents present and verified  Site marked: the operative site was marked  Patient identity confirmed: Patient identify confirmed verbally with patient.        Time out: Immediately prior to procedure a time out was called to verify the correct patient, procedure, equipment, support staff and site/side marked as required      Procedures Details:   Indications: pain   Prep: chlorhexidine  Patient position: prone  Estimated Blood Loss: minimal  Specimens: none  Number of Levels: 1  Guidance: fluoroscopy  Contrast: Procedure confirmed with contrast under live fluoroscopy.  Needle and Epidural Catheter: quincke  Needle size: 25 G  Injection procedure: Incremental injection and Negative aspiration for blood  Patient tolerance: Patient tolerated the procedure well with no immediate complications. Pressure was applied, and hemostasis was accomplished.  Outcome: Pain relieved  Comments:   LUMBAR/SACRAL TRANSFORAMINAL EPIDURAL STEROID INJECTION PROCEDURE:    1) bilateral L3-4 transforaminal epidural steroid injection  2) Fluoroscopic needle guidance    REASON FOR PROCEDURE: Lumbar radiculopathy    PHYSICIAN: Evelina Bucy, MD    MEDICATIONS INJECTED: 0.75 mL of Dexamethasone (7.5 mg) + 0.75 ml lidocaine 1% at each level    LOCAL ANESTHETIC INJECTED: 1 mL of 1% lidocaine per site    SEDATION MEDICATIONS: None    ESTIMATED BLOOD LOSS: None    SPECIMENS REMOVED: None    COMPLICATIONS: None    TECHNIQUE: Time-out was taken to identify the correct patient, procedure and side prior to starting the procedure. Lying in a prone position, the patient was prepped and draped in the usual sterile fashion using DuraPrep and a fenestrated drape. The area to be injected was determined under fluoroscopic guidance. Local anesthetic was given by raising a skin wheal and going down to the hub of a 27-gauge 1.25-inch needle.     The 3.5-inch 25-gauge Quincke needle was advanced toward the 6 o'clock position of the pedicle at each above-named nerve root level. The needle was advanced to the final position via a lateral fluoroscopic intermittent image. Omnipaque 240 was injected under live fluoroscopy and showed epidural spread and there was no vascular runoff. After a negative aspiration, the medication was then injected.     The procedure was completed without complications and was tolerated well. The patient was monitored after the procedure. The patient (or  responsible party) was given post-procedure and discharge instructions to follow at home. The patient was discharged in stable condition.       Administrations This Visit       dexamethasone PF (DECADRON) epidural injection 15 mg       Admin Date  05/15/2022 Action  Given Dose  15 mg Route  Epidural Administered By  Evelina Bucy, MD              iohexoL (OMNIPAQUE-240) 240 mg/mL injection 1 mL       Admin Date  05/15/2022 Action  Given Dose  1 mL Route  Epidural Administered By  Evelina Bucy, MD              lidocaine PF 1% (10 mg/mL) injection 6 mL       Admin Date  05/15/2022 Action  Given Dose  6 mL Route  Injection Administered By  Evelina Bucy, MD                  Estimated blood loss: none or minimal  Specimens: none  Patient tolerated the procedure well with no immediate complications. Pressure was applied, and hemostasis was accomplished.

## 2022-05-17 ENCOUNTER — Encounter: Admit: 2022-05-17 | Discharge: 2022-05-17 | Payer: MEDICARE

## 2022-05-31 ENCOUNTER — Encounter: Admit: 2022-05-31 | Discharge: 2022-05-31 | Payer: MEDICARE

## 2022-05-31 DIAGNOSIS — E1165 Type 2 diabetes mellitus with hyperglycemia: Secondary | ICD-10-CM

## 2022-05-31 DIAGNOSIS — E1142 Type 2 diabetes mellitus with diabetic polyneuropathy: Secondary | ICD-10-CM

## 2022-05-31 MED ORDER — TRULICITY 1.5 MG/0.5 ML SC PNIJ
1.5 mg | SUBCUTANEOUS | 1 refills | Status: AC
Start: 2022-05-31 — End: ?

## 2022-06-15 ENCOUNTER — Ambulatory Visit: Admit: 2022-06-15 | Discharge: 2022-06-15 | Payer: MEDICARE

## 2022-06-15 ENCOUNTER — Encounter: Admit: 2022-06-15 | Discharge: 2022-06-15 | Payer: MEDICARE

## 2022-06-15 DIAGNOSIS — C61 Malignant neoplasm of prostate: Secondary | ICD-10-CM

## 2022-06-15 LAB — CBC AND DIFF
ABSOLUTE BASO COUNT: 0 K/UL (ref 0–0.20)
ABSOLUTE EOS COUNT: 0 K/UL (ref 0–0.45)
ABSOLUTE LYMPH COUNT: 1.4 K/UL (ref 1.0–4.8)
ABSOLUTE MONO COUNT: 0.4 K/UL (ref 0–0.80)
ABSOLUTE NEUTROPHIL: 2 K/UL (ref 1.8–7.0)
BASOPHILS %: 1 % (ref 0–2)
EOSINOPHILS %: 2 % (ref >60)
HEMATOCRIT: 44 % (ref 40–50)
LYMPHOCYTES %: 36 % (ref 24–44)
MCH: 28 pg (ref 26–34)
MCHC: 33 g/dL — ABNORMAL HIGH (ref 32.0–36.0)
MONOCYTES %: 11 % (ref 4–12)
MPV: 8.8 FL (ref 7–11)
NEUTROPHILS %: 50 % (ref 41–77)
PLATELET COUNT: 140 K/UL — ABNORMAL LOW (ref 150–400)
RBC COUNT: 5 M/UL — ABNORMAL HIGH (ref 4.4–5.5)
RDW: 15 % — ABNORMAL HIGH (ref 11–15)
WBC COUNT: 4.1 K/UL — ABNORMAL LOW (ref >60)

## 2022-06-15 LAB — TESTOSTERONE,TOTAL: TESTOSTERONE, TOTAL: 20 ng/dL — ABNORMAL LOW (ref 270–1070)

## 2022-06-15 LAB — PROSTATIC SPECIFIC ANTIGEN-PSA: PROSTATIC SPEC AG: 0.2 ng/mL — ABNORMAL LOW (ref <6.01)

## 2022-06-15 LAB — COMPREHENSIVE METABOLIC PANEL
POTASSIUM: 4.4 MMOL/L (ref 3.5–5.1)
SODIUM: 138 MMOL/L (ref 137–147)

## 2022-07-18 ENCOUNTER — Encounter: Admit: 2022-07-18 | Discharge: 2022-07-18 | Payer: MEDICARE

## 2022-07-18 DIAGNOSIS — Z9889 Other specified postprocedural states: Secondary | ICD-10-CM

## 2022-07-18 LAB — POC CREATININE, RAD: CREATININE, POC: 0.7 mg/dL (ref 0.4–1.24)

## 2022-07-18 MED ORDER — IOHEXOL 350 MG IODINE/ML IV SOLN
80 mL | Freq: Once | INTRAVENOUS | 0 refills | Status: CP
Start: 2022-07-18 — End: ?
  Administered 2022-07-18: 17:00:00 80 mL via INTRAVENOUS

## 2022-07-18 MED ORDER — SODIUM CHLORIDE 0.9 % IJ SOLN
50 mL | Freq: Once | INTRAVENOUS | 0 refills | Status: CP
Start: 2022-07-18 — End: ?
  Administered 2022-07-18: 17:00:00 50 mL via INTRAVENOUS

## 2022-07-19 ENCOUNTER — Encounter: Admit: 2022-07-19 | Discharge: 2022-07-19 | Payer: MEDICARE

## 2022-07-19 DIAGNOSIS — M48061 Spinal stenosis, lumbar region without neurogenic claudication: Secondary | ICD-10-CM

## 2022-07-20 ENCOUNTER — Encounter: Admit: 2022-07-20 | Discharge: 2022-07-20 | Payer: MEDICARE

## 2022-07-22 ENCOUNTER — Encounter: Admit: 2022-07-22 | Discharge: 2022-07-22 | Payer: MEDICARE

## 2022-07-23 ENCOUNTER — Encounter: Admit: 2022-07-23 | Discharge: 2022-07-23 | Payer: MEDICARE

## 2022-07-25 ENCOUNTER — Encounter: Admit: 2022-07-25 | Discharge: 2022-07-25 | Payer: MEDICARE

## 2022-08-01 ENCOUNTER — Encounter: Admit: 2022-08-01 | Discharge: 2022-08-01 | Payer: MEDICARE

## 2022-08-06 ENCOUNTER — Encounter: Admit: 2022-08-06 | Discharge: 2022-08-06 | Payer: MEDICARE

## 2022-08-06 NOTE — Progress Notes
72 yo male pt presented to [facility] with c/o subacute diarrhea.  Pt was hospitalized at the sending facility last week with same complaint and no cause was found.  Pt with history of prostate cancer and currently under going radiation therapy.  Stool studies have been negative.  CT abd/pelvis negative for any acute findings.  Pt c/o weakness upon arrival to ED this morning.  Sending requesting transfer to higher level of care for GI consultation and possible colonoscopy which cannot be done at the sending facility.  Pt alert and oriented x 3.  VSS  Labs  Wbc 3.27  Plt 152  Na 138  K 4.5  Bun 14  Cr 0.88  Mg 1.4  Lipase 126

## 2022-08-10 ENCOUNTER — Encounter: Admit: 2022-08-10 | Discharge: 2022-08-10 | Payer: MEDICARE

## 2022-08-10 NOTE — Telephone Encounter
Reports discharged after 5 days inpatient, no triage indicated in VM. Requesting to make a follow up as indicated in his DC paperwork. LVM [id number].

## 2022-08-17 ENCOUNTER — Encounter: Admit: 2022-08-17 | Discharge: 2022-08-17 | Payer: MEDICARE

## 2022-08-20 ENCOUNTER — Encounter: Admit: 2022-08-20 | Discharge: 2022-08-20 | Payer: MEDICARE

## 2022-08-28 ENCOUNTER — Encounter: Admit: 2022-08-28 | Discharge: 2022-08-28 | Payer: MEDICARE

## 2022-08-30 ENCOUNTER — Encounter: Admit: 2022-08-30 | Discharge: 2022-08-30 | Payer: MEDICARE

## 2022-08-30 ENCOUNTER — Ambulatory Visit: Admit: 2022-08-30 | Discharge: 2022-08-31 | Payer: MEDICARE

## 2022-08-30 DIAGNOSIS — I10 Essential (primary) hypertension: Secondary | ICD-10-CM

## 2022-08-30 DIAGNOSIS — M5416 Radiculopathy, lumbar region: Secondary | ICD-10-CM

## 2022-08-30 DIAGNOSIS — M5136 Other intervertebral disc degeneration, lumbar region: Secondary | ICD-10-CM

## 2022-08-30 DIAGNOSIS — M1990 Unspecified osteoarthritis, unspecified site: Secondary | ICD-10-CM

## 2022-08-30 DIAGNOSIS — N529 Male erectile dysfunction, unspecified: Secondary | ICD-10-CM

## 2022-08-30 DIAGNOSIS — T148XXA Other injury of unspecified body region, initial encounter: Secondary | ICD-10-CM

## 2022-08-30 DIAGNOSIS — M4800 Spinal stenosis, site unspecified: Secondary | ICD-10-CM

## 2022-08-30 DIAGNOSIS — C61 Malignant neoplasm of prostate: Secondary | ICD-10-CM

## 2022-08-30 DIAGNOSIS — G971 Other reaction to spinal and lumbar puncture: Secondary | ICD-10-CM

## 2022-08-30 DIAGNOSIS — I2699 Other pulmonary embolism without acute cor pulmonale: Secondary | ICD-10-CM

## 2022-08-30 DIAGNOSIS — E1142 Type 2 diabetes mellitus with diabetic polyneuropathy: Secondary | ICD-10-CM

## 2022-08-30 DIAGNOSIS — M2550 Pain in unspecified joint: Secondary | ICD-10-CM

## 2022-08-30 DIAGNOSIS — R011 Cardiac murmur, unspecified: Secondary | ICD-10-CM

## 2022-08-30 DIAGNOSIS — Z1211 Encounter for screening for malignant neoplasm of colon: Secondary | ICD-10-CM

## 2022-08-30 DIAGNOSIS — I428 Other cardiomyopathies: Secondary | ICD-10-CM

## 2022-08-30 DIAGNOSIS — G5602 Carpal tunnel syndrome, left upper limb: Secondary | ICD-10-CM

## 2022-08-30 DIAGNOSIS — Z09 Encounter for follow-up examination after completed treatment for conditions other than malignant neoplasm: Secondary | ICD-10-CM

## 2022-08-30 DIAGNOSIS — K760 Fatty (change of) liver, not elsewhere classified: Secondary | ICD-10-CM

## 2022-08-30 DIAGNOSIS — I2510 Atherosclerotic heart disease of native coronary artery without angina pectoris: Secondary | ICD-10-CM

## 2022-08-30 DIAGNOSIS — I351 Nonrheumatic aortic (valve) insufficiency: Secondary | ICD-10-CM

## 2022-08-30 DIAGNOSIS — I493 Ventricular premature depolarization: Secondary | ICD-10-CM

## 2022-08-30 DIAGNOSIS — I7121 Ascending aortic aneurysm (HCC): Secondary | ICD-10-CM

## 2022-08-30 DIAGNOSIS — D689 Coagulation defect, unspecified: Secondary | ICD-10-CM

## 2022-08-30 DIAGNOSIS — G609 Hereditary and idiopathic neuropathy, unspecified: Secondary | ICD-10-CM

## 2022-08-30 DIAGNOSIS — E785 Hyperlipidemia, unspecified: Secondary | ICD-10-CM

## 2022-08-30 DIAGNOSIS — E669 Obesity, unspecified: Secondary | ICD-10-CM

## 2022-08-30 DIAGNOSIS — H269 Unspecified cataract: Secondary | ICD-10-CM

## 2022-08-30 DIAGNOSIS — R768 Other specified abnormal immunological findings in serum: Secondary | ICD-10-CM

## 2022-08-30 DIAGNOSIS — J189 Pneumonia, unspecified organism: Secondary | ICD-10-CM

## 2022-08-30 DIAGNOSIS — R197 Diarrhea, unspecified: Secondary | ICD-10-CM

## 2022-08-30 NOTE — Progress Notes
Date of Service: 08/30/2022    Victor Graham is a 72 y.o. male.  DOB: 06/02/1950  MRN: [id number]     Subjective:             Patient is here with his wife.  He is here for f/u of 2 separate hospitalizations due to diarrhea, dehydration and generalized weakness.  He has a known history of Prostate Cancer and was started on a new medication Diana Eves) by Oncology and shortly afterwards began developing voluminous diarrhea.  He was unable to keep up with his hydration and he was taken to the ED.  He was admitted to an outside hospital for several days and discharged.  After a few days home, he again presented to a different ED and hospital and was admitted for several more days.  He reports he is doing better for the past few days.  He reports he has not had diarrhea today but still has soft stools.  He continues to push PO fluids but feels weak still.  He is holding his metformin, lisinopril, lasix and spironolactone at this time due to low BP and weakness.  He is using a walker and wheelchair for ambulation as well.           Assessment and Plan:  1. Hospital discharge follow-up  2. Type 2 diabetes mellitus with diabetic polyneuropathy, with long-term current use of insulin (HCC)  - I have reviewed the patient's medical history in detail and updated the computerized patient record.  - will continue with current care (except metformin for now)  - he checks his blood glucose regularly at home and will restart metformin if BG increases above 150-160.  - POC HEMOGLOBIN A1C    3. Diarrhea, unspecified type  4. Malignant neoplasm of prostate (HCC)  5. Essential hypertension  - I have reviewed the patient's medical history in detail and updated the computerized patient record.  - will continue holding meds  - I reviewed signs/symptoms to monitor for  - defer to Oncology about restarting Xtandi or other med.   I appreciate their assistance in care of the patient.                      Review of Systems      Objective: ? alendronate (FOSAMAX) 70 mg tablet Take one tablet by mouth every 7 days.   ? diphenoxylate-atropine (LOMOTIL) 2.5-0.025 mg tablet TAKE 1-2 TABLETS EVERY SIX HOURS AS NEEDED FOR DIARRHEA   ? docosahexaenoic acid/epa (FISH OIL PO) Take 1,200 mg by mouth twice daily.   ? dulaglutide (TRULICITY) 3 mg/0.5 mL injection pen Inject 0.5 mL under the skin every 7 days.   ? ezetimibe (ZETIA) 10 mg tablet Take one tablet by mouth daily.   ? furosemide (LASIX) 20 mg tablet Take one tablet by mouth daily as needed.   ? lisinopriL (ZESTRIL) 10 mg tablet Take one tablet by mouth daily.   ? loperamide (IMODIUM A-D) 2 mg capsule Take one capsule by mouth four times daily as needed for Diarrhea.   ? metFORMIN (GLUCOPHAGE) 1,000 mg tablet Take one tablet by mouth twice daily with meals.   ? metoprolol succinate XL (TOPROL XL) 50 mg extended release tablet Take one tablet by mouth daily. Take 25 mg daily for one week then 50 mg daily   ? ondansetron HCL (ZOFRAN) 8 mg tablet    ? POTASSIUM CHLORIDE PO Take 90 mg by mouth twice daily.   ? pregabalin (LYRICA)  200 mg capsule Take one capsule by mouth twice daily.   ? rivaroxaban (XARELTO) 20 mg tablet Take one tablet by mouth daily with dinner.   ? rosuvastatin (CRESTOR) 20 mg tablet Take one tablet by mouth daily.   ? spironolactone (ALDACTONE) 25 mg tablet Take one tablet by mouth daily.   ? tiZANidine (ZANAFLEX) 2 mg tablet Take two tablets by mouth three times daily.   ? traMADoL (ULTRAM) 50 mg tablet Take one tablet by mouth every 8 hours as needed.     Vitals:    08/30/22 0856   BP: (!) 82/52   Pulse: 69   Temp: 36.4 ?C (97.5 ?F)   Resp: 16   SpO2: 97%   PainSc: Zero   Weight: 104.9 kg (231 lb 3.2 oz)   Height: 177.8 cm (5' 10)     Body mass index is 33.17 kg/m?Marland Kitchen     Physical Exam  Vitals reviewed.   Constitutional:       Appearance: Normal appearance. He is well-developed.   HENT:      Head: Normocephalic and atraumatic.      Right Ear: External ear normal.      Left Ear: External ear normal.      Nose: Nose normal.      Mouth/Throat:      Mouth: Mucous membranes are moist.   Eyes:      Extraocular Movements: Extraocular movements intact.      Conjunctiva/sclera: Conjunctivae normal.   Cardiovascular:      Rate and Rhythm: Normal rate and regular rhythm.      Heart sounds: Normal heart sounds. No murmur heard.  Pulmonary:      Effort: Pulmonary effort is normal.      Breath sounds: Normal breath sounds. No wheezing.   Musculoskeletal:      Cervical back: Neck supple. No muscular tenderness.   Lymphadenopathy:      Cervical: No cervical adenopathy.   Skin:     General: Skin is warm.      Capillary Refill: Capillary refill takes less than 2 seconds.      Findings: No rash.   Neurological:      General: No focal deficit present.      Mental Status: He is alert. Mental status is at baseline.      Coordination: Coordination normal.   Psychiatric:         Mood and Affect: Mood normal.         Behavior: Behavior normal.         Thought Content: Thought content normal.         Judgment: Judgment normal.

## 2022-08-31 ENCOUNTER — Encounter: Admit: 2022-08-31 | Discharge: 2022-08-31 | Payer: MEDICARE

## 2022-09-09 ENCOUNTER — Encounter: Admit: 2022-09-09 | Discharge: 2022-09-09 | Payer: MEDICARE

## 2022-09-10 ENCOUNTER — Encounter: Admit: 2022-09-10 | Discharge: 2022-09-10 | Payer: MEDICARE

## 2022-09-10 ENCOUNTER — Ambulatory Visit: Admit: 2022-09-10 | Discharge: 2022-09-10 | Payer: MEDICARE

## 2022-09-10 DIAGNOSIS — J189 Pneumonia, unspecified organism: Secondary | ICD-10-CM

## 2022-09-10 DIAGNOSIS — N529 Male erectile dysfunction, unspecified: Secondary | ICD-10-CM

## 2022-09-10 DIAGNOSIS — M5136 Other intervertebral disc degeneration, lumbar region: Secondary | ICD-10-CM

## 2022-09-10 DIAGNOSIS — I428 Other cardiomyopathies: Secondary | ICD-10-CM

## 2022-09-10 DIAGNOSIS — I351 Nonrheumatic aortic (valve) insufficiency: Secondary | ICD-10-CM

## 2022-09-10 DIAGNOSIS — E785 Hyperlipidemia, unspecified: Secondary | ICD-10-CM

## 2022-09-10 DIAGNOSIS — I2510 Atherosclerotic heart disease of native coronary artery without angina pectoris: Secondary | ICD-10-CM

## 2022-09-10 DIAGNOSIS — T148XXA Other injury of unspecified body region, initial encounter: Secondary | ICD-10-CM

## 2022-09-10 DIAGNOSIS — M2550 Pain in unspecified joint: Secondary | ICD-10-CM

## 2022-09-10 DIAGNOSIS — G609 Hereditary and idiopathic neuropathy, unspecified: Secondary | ICD-10-CM

## 2022-09-10 DIAGNOSIS — R011 Cardiac murmur, unspecified: Secondary | ICD-10-CM

## 2022-09-10 DIAGNOSIS — I493 Ventricular premature depolarization: Secondary | ICD-10-CM

## 2022-09-10 DIAGNOSIS — M48062 Spinal stenosis, lumbar region with neurogenic claudication: Secondary | ICD-10-CM

## 2022-09-10 DIAGNOSIS — E669 Obesity, unspecified: Secondary | ICD-10-CM

## 2022-09-10 DIAGNOSIS — I2699 Other pulmonary embolism without acute cor pulmonale: Secondary | ICD-10-CM

## 2022-09-10 DIAGNOSIS — M4800 Spinal stenosis, site unspecified: Secondary | ICD-10-CM

## 2022-09-10 DIAGNOSIS — D689 Coagulation defect, unspecified: Secondary | ICD-10-CM

## 2022-09-10 DIAGNOSIS — M1990 Unspecified osteoarthritis, unspecified site: Secondary | ICD-10-CM

## 2022-09-10 DIAGNOSIS — I10 Essential (primary) hypertension: Secondary | ICD-10-CM

## 2022-09-10 DIAGNOSIS — R768 Other specified abnormal immunological findings in serum: Secondary | ICD-10-CM

## 2022-09-10 DIAGNOSIS — M48061 Spinal stenosis, lumbar region without neurogenic claudication: Secondary | ICD-10-CM

## 2022-09-10 DIAGNOSIS — I7121 Ascending aortic aneurysm (HCC): Secondary | ICD-10-CM

## 2022-09-10 DIAGNOSIS — K760 Fatty (change of) liver, not elsewhere classified: Secondary | ICD-10-CM

## 2022-09-10 DIAGNOSIS — M5416 Radiculopathy, lumbar region: Secondary | ICD-10-CM

## 2022-09-10 DIAGNOSIS — G971 Other reaction to spinal and lumbar puncture: Secondary | ICD-10-CM

## 2022-09-10 DIAGNOSIS — C801 Malignant (primary) neoplasm, unspecified: Secondary | ICD-10-CM

## 2022-09-10 DIAGNOSIS — H269 Unspecified cataract: Secondary | ICD-10-CM

## 2022-09-10 DIAGNOSIS — G5602 Carpal tunnel syndrome, left upper limb: Secondary | ICD-10-CM

## 2022-09-10 MED ORDER — RIVAROXABAN 20 MG PO TAB
20 mg | ORAL_TABLET | Freq: Every day | ORAL | 0 refills | 30.00000 days | Status: AC
Start: 2022-09-10 — End: ?

## 2022-09-10 NOTE — Telephone Encounter
09/10/2022 10:32 AM   Patient sent [facility] requesting Xarelto refill. Labs on trend with patient history. Refill approved.

## 2022-09-10 NOTE — Patient Instructions
It was nice to see you today.  Thank you for choosing to visit our clinic.  Your time is important, and if you had to wait today, we do apologize.  Our goal is to run exactly on time.  However, on occasion, we get behind in clinic due to unexpected patient issues.  Thank you for your patience.    General Instructions:  Scheduling:  Our scheduling phone number is [phone].  Appointment Reminders on your cell phone:  Communication preferences can be managed in MyChart to ensure you receive important appointment notifications  How to reach our office:  Please send a MyChart message to the [facility] (directed to Dr. Davis) or leave a voicemail for the nurse, Aurelio Mccamy, at [phone].  How to get a medication refill:  Please use the MyChart Refill request or contact your pharmacy directly to request medication refills.  Please allow 72 business hours for request to be completed.    Support for many chronic illnesses is available through [facility] at [email or web address] or [phone].    For help with MyChart:  please call [phone].    For questions on nights, weekends or holidays:  call the Operator at [phone], and ask for the doctor on call for Neurosurgery.    For more information on spinal conditions:  please visit [email or web address]     Again, thank you for coming in today.

## 2022-09-10 NOTE — Progress Notes
Subjective:       History of Present Illness  Victor Graham is a 72 y.o. male.  He presents with chief complaint of low back pain with radiation into the bilateral lower extremities associated with numbness and tingling.  This has been going on for well over a year.  Symptoms are worse with standing and walking and improved with recumbency.  He has failed nonoperative management with physical therapy, NSAIDs, pain medications, muscle relaxants, and injections.  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  I independently reviewed recent MRI of the lumbar spine as well as standing lumbar films.  These demonstrate prior L4-5 instrumented fusion.  There is significant stenosis from L1-2 down through L3-4.  Standing lumbar films demonstrate stable conformation of the lumbar spine without abnormal motion on flexion/extension views.  There does appear to be solid bony fusion at L4-5.             Objective:          alendronate (FOSAMAX) 70 mg tablet Take one tablet by mouth every 7 days.    diphenoxylate-atropine (LOMOTIL) 2.5-0.025 mg tablet TAKE 1-2 TABLETS EVERY SIX HOURS AS NEEDED FOR DIARRHEA    docosahexaenoic acid/epa (FISH OIL PO) Take 1,200 mg by mouth twice daily.    dulaglutide (TRULICITY) 3 mg/0.5 mL injection pen Inject 0.5 mL under the skin every 7 days.    ezetimibe (ZETIA) 10 mg tablet Take one tablet by mouth daily.    furosemide (LASIX) 20 mg tablet Take one tablet by mouth daily as needed.    lisinopriL (ZESTRIL) 10 mg tablet Take one tablet by mouth daily.    metFORMIN (GLUCOPHAGE) 1,000 mg tablet Take one tablet by mouth twice daily with meals.    metoprolol succinate XL (TOPROL XL) 50 mg extended release tablet Take one tablet by mouth daily. Take 25 mg daily for one week then 50 mg daily    ondansetron HCL (ZOFRAN) 8 mg tablet     POTASSIUM CHLORIDE PO Take 90 mg by mouth twice daily.    pregabalin (LYRICA) 200 mg capsule Take one capsule by mouth twice daily.    rivaroxaban (XARELTO) 20 mg tablet Take one tablet by mouth daily with dinner.    rosuvastatin (CRESTOR) 20 mg tablet Take one tablet by mouth daily.    spironolactone (ALDACTONE) 25 mg tablet Take one tablet by mouth daily.    tiZANidine (ZANAFLEX) 2 mg tablet Take two tablets by mouth three times daily.    traMADoL (ULTRAM) 50 mg tablet Take one tablet by mouth every 8 hours as needed.     Vitals:    09/10/22 1118   BP: 129/67   BP Source: Arm, Left Upper   Pulse: 73   Temp: 36.4 ?C (97.5 ?F)   SpO2: 98%   TempSrc: Skin   PainSc: Four   Weight: 113.4 kg (250 lb)   Height: 177.8 cm (5' 10)     Body mass index is 35.87 kg/m?Marland Kitchen     Physical Exam  Vitals and nursing note reviewed.   Constitutional:       Appearance: Normal appearance.   HENT:      Head: Normocephalic and atraumatic.   Pulmonary:      Effort: Pulmonary effort is normal.   Musculoskeletal:      Cervical back: No tenderness.      Thoracic back: No tenderness.  Lumbar back: No tenderness.   Neurological:      Mental Status: He is alert and oriented to person, place, and time.      Sensory: Sensation is intact.      Motor: Weakness present.      Gait: Gait abnormal (Antalgic gait).      Deep Tendon Reflexes: Reflexes are normal and symmetric.      Comments: Right dorsiflexion/EHL 4/5 otherwise motor 5/5 throughout   Psychiatric:         Mood and Affect: Mood normal.         Behavior: Behavior normal.         Thought Content: Thought content normal.         Judgment: Judgment normal.          Assessment and Plan:     Patient has lumbar stenosis with neurogenic claudication that is failed nonoperative management.  I am recommending open L1-3 laminectomies.  Surgical procedure, risk, benefits, alternatives were discussed with the patient in detail.  He is going to think about it and let us know if he like to proceed with surgery.

## 2022-09-11 ENCOUNTER — Ambulatory Visit: Admit: 2022-09-11 | Discharge: 2022-09-11 | Payer: MEDICARE

## 2022-09-11 ENCOUNTER — Encounter: Admit: 2022-09-11 | Discharge: 2022-09-11 | Payer: MEDICARE

## 2022-09-11 DIAGNOSIS — E785 Hyperlipidemia, unspecified: Secondary | ICD-10-CM

## 2022-09-11 DIAGNOSIS — I428 Other cardiomyopathies: Secondary | ICD-10-CM

## 2022-09-11 DIAGNOSIS — I493 Ventricular premature depolarization: Secondary | ICD-10-CM

## 2022-09-11 DIAGNOSIS — E781 Pure hyperglyceridemia: Secondary | ICD-10-CM

## 2022-09-11 DIAGNOSIS — M5136 Other intervertebral disc degeneration, lumbar region: Secondary | ICD-10-CM

## 2022-09-11 DIAGNOSIS — D689 Coagulation defect, unspecified: Secondary | ICD-10-CM

## 2022-09-11 DIAGNOSIS — M2550 Pain in unspecified joint: Secondary | ICD-10-CM

## 2022-09-11 DIAGNOSIS — T148XXA Other injury of unspecified body region, initial encounter: Secondary | ICD-10-CM

## 2022-09-11 DIAGNOSIS — N529 Male erectile dysfunction, unspecified: Secondary | ICD-10-CM

## 2022-09-11 DIAGNOSIS — K760 Fatty (change of) liver, not elsewhere classified: Secondary | ICD-10-CM

## 2022-09-11 DIAGNOSIS — M810 Age-related osteoporosis without current pathological fracture: Secondary | ICD-10-CM

## 2022-09-11 DIAGNOSIS — M1990 Unspecified osteoarthritis, unspecified site: Secondary | ICD-10-CM

## 2022-09-11 DIAGNOSIS — I2510 Atherosclerotic heart disease of native coronary artery without angina pectoris: Secondary | ICD-10-CM

## 2022-09-11 DIAGNOSIS — G5602 Carpal tunnel syndrome, left upper limb: Secondary | ICD-10-CM

## 2022-09-11 DIAGNOSIS — I351 Nonrheumatic aortic (valve) insufficiency: Secondary | ICD-10-CM

## 2022-09-11 DIAGNOSIS — I7121 Ascending aortic aneurysm (HCC): Secondary | ICD-10-CM

## 2022-09-11 DIAGNOSIS — E119 Type 2 diabetes mellitus without complications: Secondary | ICD-10-CM

## 2022-09-11 DIAGNOSIS — I729 Aneurysm of unspecified site: Secondary | ICD-10-CM

## 2022-09-11 DIAGNOSIS — G971 Other reaction to spinal and lumbar puncture: Secondary | ICD-10-CM

## 2022-09-11 DIAGNOSIS — M4800 Spinal stenosis, site unspecified: Secondary | ICD-10-CM

## 2022-09-11 DIAGNOSIS — G609 Hereditary and idiopathic neuropathy, unspecified: Secondary | ICD-10-CM

## 2022-09-11 DIAGNOSIS — R011 Cardiac murmur, unspecified: Secondary | ICD-10-CM

## 2022-09-11 DIAGNOSIS — J189 Pneumonia, unspecified organism: Secondary | ICD-10-CM

## 2022-09-11 DIAGNOSIS — R600 Localized edema: Secondary | ICD-10-CM

## 2022-09-11 DIAGNOSIS — Z9229 Personal history of other drug therapy: Secondary | ICD-10-CM

## 2022-09-11 DIAGNOSIS — H269 Unspecified cataract: Secondary | ICD-10-CM

## 2022-09-11 DIAGNOSIS — I2699 Other pulmonary embolism without acute cor pulmonale: Secondary | ICD-10-CM

## 2022-09-11 DIAGNOSIS — E669 Obesity, unspecified: Secondary | ICD-10-CM

## 2022-09-11 DIAGNOSIS — R768 Other specified abnormal immunological findings in serum: Secondary | ICD-10-CM

## 2022-09-11 DIAGNOSIS — T50905A Adverse effect of unspecified drugs, medicaments and biological substances, initial encounter: Secondary | ICD-10-CM

## 2022-09-11 DIAGNOSIS — I10 Essential (primary) hypertension: Secondary | ICD-10-CM

## 2022-09-11 DIAGNOSIS — M5416 Radiculopathy, lumbar region: Secondary | ICD-10-CM

## 2022-09-11 DIAGNOSIS — C801 Malignant (primary) neoplasm, unspecified: Secondary | ICD-10-CM

## 2022-09-11 NOTE — Patient Instructions
Thank you for visiting our office today, we want to provide you with the best possible care.     Dr Iver Nestle would like to see you again in about 3 months.     Take your medications as prescribed.   Please carefully check your list with what you have on hand at home and notify us of any changes or updates.     Please call or mychart message with any additional questions or concerns.    For questions: please Dr. Rollene Fare nursing line at [phone] Monday - Friday 8-5 only. [facility] [address] [facility] [address]/[place])        Please leave a detailed message with your name, date of birth, and reason for your call.  A nurse will return your call as soon as possible    Scheduling: Please call [phone]    After hours: [phone]    For all medication refills: please contact your pharmacy.      It was truly our pleasure seeing you today. Thank you for choosing [facility] for your heart health!

## 2022-09-11 NOTE — Progress Notes
Date of Service: 09/11/2022    Victor Graham is a 72 y.o. male.       HPI    Victor Graham is a pleasant 72 year old gentleman, coming in for her regularly scheduled follow-up of his mild nonischemic cardiomyopathy, mild to moderate coronary disease, essential hypertension, prior ascending aortic aneurysm repair and dyslipidemia.  He is accompanied by his wife today.     The main complaint today is swelling in the lower extremities.  The swelling in the right lower extremity is somewhat chronic.  The left lower extremity swelling is new.  He was diagnosed with prostate ca and was given a medication that caused profuse diarrhea.  He is having difficulty urinating due to prostate problems.  He is undergoing radiation treatment also.  He has a lot of urinary urgency and the stream is not the best.  He had lost a lot of weight when he had the diarrhea.  He thinks he has gained most of it back but a lot of it is water weight.  He has been hesitant to take diuretics on account of urinary urgency and hesitancy.  He denies any orthopnea or paroxysmal nocturnal dyspnea.  His activity level is limited but he does not feel short of breath.        Previous remote coronary angiography had shown mild nonobstructive CAD.  The last stress test in 2020 showed a small area of mix of infarct and ischemia in the inferolateral wall. The latest echo is from 2022 showing an EF of 45%.  This is a chronic finding.    The ascending aortic aneurysm repair was many years ago.  The last CT done in 2022-noncontrast study, appearance was similar to the one done in 2020.  The previous 2020 study was reviewed by  Dr. Helen Hashimoto of [facility] personally and he felt that there is nothing to worry about.      He is compliant with his medications.  Cholesterol profile in July 2023 had shown excellent control of LDL.        The Xarelto is being taken for his history of PE.      He does not smoke.            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  (HCC) 10/30/2021    Venous stasis ulcer of other part of lower leg limited to breakdown of skin, unspecified laterality, unspecified whether varicose veins present (HCC) 10/30/2021    Carpal tunnel syndrome of left wrist 07/06/2021     emg by Dr Lynnae Sandhoff at [facility] shows severe median neuropathy      Leg swelling 01/12/2021    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 Started Rapaflo 8mg   01/26/14 - LUTS much improved. Continue Rapaflo 8mg   02/15/15 - stable LUTS      08/16/15- Minimal nocturia and no other symptoms. Interested in trialing discontinuation of his Rapaflo. He will call if his symptoms worsen and we can refill his medications over the phone.     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 New finding compared to prior EF 45-50% in '09 (by echo at [facility]), LV gram from '09 reviewed- EF 45%      Status post thoracic aortic aneurysm repair 07/12/2008     3/10-   30 mm Hemashield graft used to replace the ascending aorta  02/2019- Stable focal dissection flap originating from the superior aspect of   the ascending aortic graft. Has been personally reviewed by Dr Helen Hashimoto and no concerns      Essential hypertension 01/08/2008     09-17-14: Well controlled. Taking Lisinopril 10 mg daily. Coreg 25 mg 0.5 tablet BID  Well controlled      Dyslipidemia 01/08/2008     10-08-14: TC 164, HDL 35, TG 237. On Zocor 20 mg  07-07-14: TC 209, LDL 234, HDL 45, TG 234  11/14 TC 153, LDL 74, HDL 45, TG 172  1/14 good numbers  5/13 TC 152, LDL 76, HDL 39, TG 183  2/11 TC 147, LDL 81, HDL 42, TG 138  Panel checked in 6/10- TC 144, LDL 87, HDL 36, TG 131, on simva 40 qhs, intolerant of niacin      CAD (coronary artery disease) 01/08/2008     Mild to moderate, non-obstructive by angio in 10/09 done following an abnormal stress thallium            Review of Systems   Constitutional: Negative.   HENT: Negative.     Eyes: Negative.    Cardiovascular:  Positive for irregular heartbeat and leg swelling.   Respiratory: Negative.     Endocrine: Negative.    Hematologic/Lymphatic: Negative.    Skin: Negative.    Musculoskeletal:  Positive for joint swelling.   Gastrointestinal: Negative.    Genitourinary: Negative.    Neurological: Negative.    Psychiatric/Behavioral: Negative.     Allergic/Immunologic: Negative.        Physical Exam    General Appearance: no acute distress  Skin: warm, moist, no ulcers  HEENT: unremarkable  Neck Veins: neck veins are flat, neck veins are not distended  Carotid Arteries: normal carotid upstroke bilaterally, no bruits  Chest Inspection: chest is normal in appearance  Auscultation/Percussion: lungs clear   Cardiac Rhythm: regular rhythm and normal rate  Cardiac Auscultation: Normal S1 & S2, no S3 or S4, no rub  Murmurs: soft esm base   Extremities: Bilateral 2+ lower extremity edema;  Abdominal Exam: soft, non-tender, no  masses, bowel sounds normal  Neurologic Exam: neurological assessment grossly intact      Cardiovascular Health Factors  Vitals BP Readings from Last 3 Encounters:   09/11/22 135/76   09/10/22 129/67   08/30/22 (!) 82/52     Wt Readings from Last 3 Encounters:   09/11/22 114.6 kg (252 lb 9.6 oz)   09/10/22 113.4 kg (250 lb)   08/30/22 104.9 kg (231 lb 3.2 oz)     BMI Readings from Last 3 Encounters:   09/11/22 36.24 kg/m?   09/10/22 35.87 kg/m?   08/30/22 33.17 kg/m?      Smoking Social History     Tobacco Use   Smoking Status Former    Current packs/day: 0.00    Average packs/day: 2.0 packs/day for 15.0 years (30.0 ttl pk-yrs)    Types: Cigarettes    Start date: 08/23/1976    Quit date: 08/24/1991    Years since quitting: 31.0    Passive exposure: Past   Smokeless Tobacco Never      Lipid Profile Cholesterol   Date Value Ref Range Status   10/30/2021 106 <200 MG/DL Final     HDL   Date Value Ref Range Status   10/30/2021 35 (L) >40 MG/DL Final     LDL   Date Value Ref Range Status   10/30/2021 46 <100 mg/dL Final     Triglycerides   Date Value Ref Range Status   10/30/2021 220 (H) <150 MG/DL Final      Blood Sugar Hemoglobin A1C   Date Value Ref Range Status   10/30/2021 8.5 (H) 4.0 - 5.7 % Final     Comment:     The ADA recommends that most patients with type 1 and type 2 diabetes maintain   an A1c level <7%.       Glucose   Date Value Ref Range Status   06/15/2022 128 (H) 70 - 100 MG/DL Final   16/01/9603 540 (H) 70 - 100 MG/DL Final   98/02/9146 829 (H) 70 - 100 MG/DL Final     Glucose Fasting   Date Value Ref Range Status   09/01/2015 117 (H) 70 - 100 MG/DL Final     Glucose, POC   Date Value Ref Range Status   12/22/2021 164 (A) 70 - 100 Final   12/22/2021 197 (A) 70 - 100 Final   04/18/2017 98 70 - 100 MG/DL Final     Glucose POC   Date Value Ref Range Status   03/18/2019 99 65 - 110 mg/dL Final 56/21/3086 578 (A) 65 - 110 mg/dL Final   46/96/2952 841 65 - 110 mg/dL Final          Problems Addressed Today  Encounter Diagnoses   Name Primary?    Non-ischemic cardiomyopathy (HCC) Yes    Edema of both lower extremities     Essential hypertension          Assessment and Plan     In summary, Mr. Winborne is a 72 year old gentleman with the following cardiac and related issues:      History of mild nonischemic cardiomyopathy, last echo in April 2022, EF of 45%, euvolemic.  He does have obvious volume overload.  Fortunately, he is not dyspneic or orthopneic.  He thinks that the weight has been steady at 252 pounds for the last few days.  I did tell him that if he gets short of breath or if the weight continues to trend upwards, he will have  to use furosemide to control the fluid irrespective of his urine problems.  He has been told that the urine flow should get better in the next couple of weeks.           Hypertension, very well controlled.   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   Dyslipidemia-  on Crestor with excellent LDL control as of 10/2021.    PVCs-currently asymptomatic.  Continue beta-blockers     History of ascending aortic aneurysm repair in 2010.  ?  Residual dissection reported in 2020.    Unchanged noncontrast CT in 2022.  CT with contrast done in 2024 results are acceptable.    DM2, followed by Dr. Abel Presto     Mildly abnl stress test,  asymptomatic, on statins, beta-blockers and with no h/o chest discomfort       It was a pleasure to see Dewitte in the clinic today.           Current Medications (including today's revisions)   alendronate (FOSAMAX) 70 mg tablet Take one tablet by mouth every 7 days.    diphenoxylate-atropine (LOMOTIL) 2.5-0.025 mg tablet TAKE 1-2 TABLETS EVERY SIX HOURS AS NEEDED FOR DIARRHEA    docosahexaenoic acid/epa (FISH OIL PO) Take 1,200 mg by mouth twice daily.    dulaglutide (TRULICITY) 3 mg/0.5 mL injection pen Inject 0.5 mL under the skin every 7 days.    ezetimibe (ZETIA) 10 mg tablet Take one tablet by mouth daily.    furosemide (LASIX) 20 mg tablet Take one tablet by mouth daily as needed.    lisinopriL (ZESTRIL) 10 mg tablet Take one tablet by mouth daily.    metFORMIN (GLUCOPHAGE) 1,000 mg tablet Take one tablet by mouth twice daily with meals.    metoprolol succinate XL (TOPROL XL) 50 mg extended release tablet Take one tablet by mouth daily. Take 25 mg daily for one week then 50 mg daily    ondansetron HCL (ZOFRAN) 8 mg tablet     POTASSIUM CHLORIDE PO Take 90 mg by mouth twice daily.    pregabalin (LYRICA) 200 mg capsule Take one capsule by mouth twice daily.    rivaroxaban (XARELTO) 20 mg tablet Take one tablet by mouth daily with dinner.    rosuvastatin (CRESTOR) 20 mg tablet Take one tablet by mouth daily.    spironolactone (ALDACTONE) 25 mg tablet Take one tablet by mouth daily.    tiZANidine (ZANAFLEX) 2 mg tablet Take two tablets by mouth three times daily.    traMADoL (ULTRAM) 50 mg tablet Take one tablet by mouth every 8 hours as needed.

## 2022-09-20 ENCOUNTER — Encounter: Admit: 2022-09-20 | Discharge: 2022-09-20 | Payer: MEDICARE

## 2022-09-20 ENCOUNTER — Ambulatory Visit: Admit: 2022-09-20 | Discharge: 2022-09-20 | Payer: MEDICARE

## 2022-09-20 DIAGNOSIS — G5602 Carpal tunnel syndrome, left upper limb: Secondary | ICD-10-CM

## 2022-09-20 DIAGNOSIS — K760 Fatty (change of) liver, not elsewhere classified: Secondary | ICD-10-CM

## 2022-09-20 DIAGNOSIS — T148XXA Other injury of unspecified body region, initial encounter: Secondary | ICD-10-CM

## 2022-09-20 DIAGNOSIS — J189 Pneumonia, unspecified organism: Secondary | ICD-10-CM

## 2022-09-20 DIAGNOSIS — M4800 Spinal stenosis, site unspecified: Secondary | ICD-10-CM

## 2022-09-20 DIAGNOSIS — C801 Malignant (primary) neoplasm, unspecified: Secondary | ICD-10-CM

## 2022-09-20 DIAGNOSIS — I729 Aneurysm of unspecified site: Secondary | ICD-10-CM

## 2022-09-20 DIAGNOSIS — Z9229 Personal history of other drug therapy: Secondary | ICD-10-CM

## 2022-09-20 DIAGNOSIS — M1990 Unspecified osteoarthritis, unspecified site: Secondary | ICD-10-CM

## 2022-09-20 DIAGNOSIS — I7121 Ascending aortic aneurysm (HCC): Secondary | ICD-10-CM

## 2022-09-20 DIAGNOSIS — G609 Hereditary and idiopathic neuropathy, unspecified: Secondary | ICD-10-CM

## 2022-09-20 DIAGNOSIS — E785 Hyperlipidemia, unspecified: Secondary | ICD-10-CM

## 2022-09-20 DIAGNOSIS — N529 Male erectile dysfunction, unspecified: Secondary | ICD-10-CM

## 2022-09-20 DIAGNOSIS — M5417 Radiculopathy, lumbosacral region: Secondary | ICD-10-CM

## 2022-09-20 DIAGNOSIS — H269 Unspecified cataract: Secondary | ICD-10-CM

## 2022-09-20 DIAGNOSIS — M810 Age-related osteoporosis without current pathological fracture: Secondary | ICD-10-CM

## 2022-09-20 DIAGNOSIS — M5136 Other intervertebral disc degeneration, lumbar region: Secondary | ICD-10-CM

## 2022-09-20 DIAGNOSIS — R768 Other specified abnormal immunological findings in serum: Secondary | ICD-10-CM

## 2022-09-20 DIAGNOSIS — R011 Cardiac murmur, unspecified: Secondary | ICD-10-CM

## 2022-09-20 DIAGNOSIS — I493 Ventricular premature depolarization: Secondary | ICD-10-CM

## 2022-09-20 DIAGNOSIS — Z9689 Presence of other specified functional implants: Secondary | ICD-10-CM

## 2022-09-20 DIAGNOSIS — I2510 Atherosclerotic heart disease of native coronary artery without angina pectoris: Secondary | ICD-10-CM

## 2022-09-20 DIAGNOSIS — E669 Obesity, unspecified: Secondary | ICD-10-CM

## 2022-09-20 DIAGNOSIS — M5416 Radiculopathy, lumbar region: Secondary | ICD-10-CM

## 2022-09-20 DIAGNOSIS — G971 Other reaction to spinal and lumbar puncture: Secondary | ICD-10-CM

## 2022-09-20 DIAGNOSIS — I10 Essential (primary) hypertension: Secondary | ICD-10-CM

## 2022-09-20 DIAGNOSIS — I428 Other cardiomyopathies: Secondary | ICD-10-CM

## 2022-09-20 DIAGNOSIS — I351 Nonrheumatic aortic (valve) insufficiency: Secondary | ICD-10-CM

## 2022-09-20 DIAGNOSIS — E781 Pure hyperglyceridemia: Secondary | ICD-10-CM

## 2022-09-20 DIAGNOSIS — E119 Type 2 diabetes mellitus without complications: Secondary | ICD-10-CM

## 2022-09-20 DIAGNOSIS — D689 Coagulation defect, unspecified: Secondary | ICD-10-CM

## 2022-09-20 DIAGNOSIS — I2699 Other pulmonary embolism without acute cor pulmonale: Secondary | ICD-10-CM

## 2022-09-20 DIAGNOSIS — M2550 Pain in unspecified joint: Secondary | ICD-10-CM

## 2022-09-20 DIAGNOSIS — T50905A Adverse effect of unspecified drugs, medicaments and biological substances, initial encounter: Secondary | ICD-10-CM

## 2022-09-20 NOTE — Progress Notes
[facility] Inter[facility]    Subjective     Chief Complaint: Pain  Chief Complaint   Patient presents with    Lower Back - Pain    Pain       HPI: [place] is a 72 y.o. male who  has a past medical history of Adverse drug reaction, Aneurysm (HCC), Arthritis, Ascending aortic aneurysm (HCC) (01/08/2008), Asymptomatic PVCs (03/11/2015), Blood clotting disorder (HCC) (05 Apr 2017), CAD (coronary artery disease), Carpal tunnel syndrome of left wrist, Cataract, Degenerative disc disease, lumbar, Diabetes (HCC), Dyslipidemia, Erectile dysfunction, Fatty liver, Heart murmur (Nov 2020), HLD (hyperlipidemia) (01/08/2008), anticoagulation, Hypertension (01/08/2008), Hypertriglyceridemia, Idiopathic polyneuropathy, Joint pain (Jul 2008), Lumbar radiculopathy, Nerve injury, NICM (nonischemic cardiomyopathy) (HCC), 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 who presents for evaluation.    LBP    Finished radiation for prostate ca  Had a severe reaction to one of his chemo/hormone medications  Was hospitalized for several weeks.    L3-L4 TFESI on 05/15/2022  excellent relief for about one week    Patient would like SCS removed    LBP  present for years  lower back  rarely will radiate to bilateral buttock  worse with bending, standing or walking   Alleviated by sitting or rest  Reports BLE weakness  VAS 2-8/10    Noticing weakness in RLE    Reports about 40% relief with SCS  Only has one lead  Did not follow up with surgery due to recent ca diagnosis         PRIOR MEDICATIONS:   Effective  Lyrica  NSAID  Acetaminophen  Tizanidine    Ineffective  Gabapentin     Unable to tolerate      Never  Ami/Nortriptyline  Cymbalta  Tizanidine      PRIOR INTERVENTIONS:  Lumbar fusion 2009  Effective  TFESI - transient  SCS    Ineffective  Lumbar RFA        Past Medical History:  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    hex lupus anticoagulant    CAD (coronary artery disease)     Carpal tunnel syndrome of left wrist     emg by Dr Lynnae Sandhoff at [facility] shows severe median neuropathy    Cataract     Cateract Surgery Dec 2022    Degenerative disc disease, lumbar     Diabetes (HCC)     Dyslipidemia     Erectile dysfunction     Fatty liver     Heart murmur Nov 2020    HLD (hyperlipidemia) 01/08/2008    HX: anticoagulation     Hypertension 01/08/2008    Hypertriglyceridemia     Idiopathic polyneuropathy     Joint pain Jul 2008    Lumbar radiculopathy     Nerve injury     NICM (nonischemic cardiomyopathy) (HCC)     Nonrheumatic aortic valve  insufficiency 09/13/2015    09/2015 Mild on prior assessments     Obesity     Osteoporosis     Other malignant neoplasm without specification of site Dec 2023    Pneumonia 2008    Positive ANA (antinuclear antibody) 02/03/2016    Positive ANA (antinuclear antibody) 02/03/2016    Pulmonary embolism (HCC) 2015 -- july 20th    takes xaralto anticoagulant -- treated at [facility]    Spinal headache May 1993    Spinal stenosis 04 Sep 2015       Family History:  Family History   Problem Relation Name Age of Onset    DVT Mother [place]     Arthritis Mother [place]     Joint Pain Mother [place]     Cancer Father [place]         Prostate cancer    DVT Brother      Stroke Paternal Grandfather [place]     Cancer Paternal Grandmother [place]     Arthritis Paternal Grandmother [place]     Stroke Maternal Grandfather [place]     Cancer Maternal Grandmother [place]         Colon Cancer       Social History:  Lives in [place]    Social History     Socioeconomic History    Marital status: Married   Tobacco Use    Smoking status: Former     Current packs/day: 0.00     Average packs/day: 2.0 packs/day for 15.0 years (30.0 ttl pk-yrs)     Types: Cigarettes     Start date: 08/23/1976     Quit date: 08/24/1991     Years since quitting: 31.0     Passive exposure: Past    Smokeless tobacco: Never   Vaping Use    Vaping status: Never Used   Substance and Sexual Activity    Alcohol use: No    Drug use: No    Sexual activity: Not Currently     Partners: Female     Birth control/protection: None   Other Topics Concern    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, [employer]       Allergies:  Allergies   Allergen Reactions    Atorvastatin FLUSHING (SKIN), SEE COMMENTS and HIVES     rash      Niacin FLUSHING (SKIN) and SEE COMMENTS     Other reaction(s): Red flush, itching  Other reaction(s): itching/redness    Other [Unclassified Drug] BLISTERS     dissolving stiches in mouth after dental surgery caused blisters    Xtandi [Enzalutamide] DIARRHEA     Caused severe dehydration and 2 - 5 day hospitalizations    Rubber SEE COMMENTS     Neoprene rubber causes contact dermatitis (CAUSED BY THIOUREA IN NEOPRENE)       Medications:    Current Outpatient Medications:     alendronate (FOSAMAX) 70 mg tablet, Take one tablet by mouth every 7 days., Disp: , Rfl:     diphenoxylate-atropine (LOMOTIL) 2.5-0.025 mg tablet, TAKE 1-2 TABLETS EVERY SIX HOURS AS NEEDED FOR DIARRHEA, Disp: , Rfl:     docosahexaenoic acid/epa (FISH OIL PO), Take 1,200 mg by mouth twice daily., Disp: , Rfl:     dulaglutide (TRULICITY) 3 mg/0.5 mL injection pen, Inject 0.5 mL under the skin every 7 days., Disp: 2 mL, Rfl: 11    ezetimibe (ZETIA) 10 mg tablet,  Take one tablet by mouth daily., Disp: 90 tablet, Rfl: 3 furosemide (LASIX) 20 mg tablet, Take one tablet by mouth daily as needed., Disp: 90 tablet, Rfl: 1    lisinopriL (ZESTRIL) 10 mg tablet, Take one tablet by mouth daily., Disp: 90 tablet, Rfl: 3    metFORMIN (GLUCOPHAGE) 1,000 mg tablet, Take one tablet by mouth twice daily with meals., Disp: 180 tablet, Rfl: 3    metoprolol succinate XL (TOPROL XL) 50 mg extended release tablet, Take one tablet by mouth daily. Take 25 mg daily for one week then 50 mg daily, Disp: 90 tablet, Rfl: 3    ondansetron HCL (ZOFRAN) 8 mg tablet, , Disp: , Rfl:     POTASSIUM CHLORIDE PO, Take 90 mg by mouth twice daily., Disp: , Rfl:     pregabalin (LYRICA) 200 mg capsule, Take one capsule by mouth twice daily., Disp: 180 capsule, Rfl: 3    rivaroxaban (XARELTO) 20 mg tablet, Take one tablet by mouth daily with dinner., Disp: 90 tablet, Rfl: 0    rosuvastatin (CRESTOR) 20 mg tablet, Take one tablet by mouth daily., Disp: 90 tablet, Rfl: 1    spironolactone (ALDACTONE) 25 mg tablet, Take one tablet by mouth daily., Disp: 90 tablet, Rfl: 1    tiZANidine (ZANAFLEX) 2 mg tablet, Take two tablets by mouth three times daily., Disp: 60 tablet, Rfl: 0    traMADoL (ULTRAM) 50 mg tablet, Take one tablet by mouth every 8 hours as needed., Disp: 90 tablet, Rfl: 2    Physical examination:   BP 93/63  - Pulse 80  - Ht 177.8 cm (5' 10)  - Wt 110.7 kg (244 lb)  - SpO2 97%  - BMI 35.01 kg/m?   Pain Score: Four  Oswestry Total Score:: 52    Physical Exam:  General: The patient is a well-developed, well nourished 72 y.o. male in no acute distress.   HEENT: Head is normocephalic and atraumatic. Pupils are equal and reactive to light bilaterally.   Cardiac: Based on palpation, pulse appears to be regular rate and rhythm.   Pulmonary: The patient has unlabored respirations and bilateral symmetric chest excursion.   Abdomen: Soft, nontender, and nondistended.   Extremities: No clubbing, cyanosis, or edema.     Neurologic:   The patient is alert and oriented times 3.       Musculoskeletal:   Gait is antalgic - walker    Lumbar spine:  Appearance: No lesions or deformity  Lumbar tenderness: moderate tenderness, increased paraspinal muscle tone  SI joint tenderness: None  Pain with extension: Yes  Facet loading positive  Pain with lateral flexion: None  FABER positive?: None  Lower extremity strength: 4/5 bilaterally  Sensation to light touch: Intact and equal in the bilateral lower extremities  Straight leg raise positive?: Bilateral      MS:   Root Right Left   Hip Flexion L2 4 5   Knee Flexion L5/S1 5 5   Knee Extension L3 4 5   Dorsiflexion L4 4 5   Plantarflexion S1 5 5   EHL Extension L5 5 5       MRI L-spine 02/15/2022  FINDINGS:     Reported spinal stimulator lead migration and interval revision, poorly   evaluated on the current imaging modality. Prior L4-5 posterior spinal   fusion with associated signal distortion. Stable mild anterolisthesis at   L4-L5 and mild retrolisthesis at L1-2 and L2-L3. Left lateral listhesis   L3-L4 and a left convexity  lumbar curvature. Chronic mild degenerative   vertebral body height loss and multilevel degenerative marrow endplate   changes along the inner scoliotic curvature.     The conus is normal in position and appearance at the L1 level.     T11-T12: Likely at least moderate central spinal stenosis, suboptimally   evaluated due to exclusion on the axial field of view.     T12-L1: Disc bulge and facet hypertrophy results in mild to moderate   trefoil type central spinal stenosis and mild bilateral foraminal   stenosis.     L1-L2: Retrolisthesis, degenerative disc disease, disc osteophyte complex,   facet hypertrophy, and ligamentum flavum thickening results in   moderate-severe trefoil type central spinal stenosis and moderate   bilateral foraminal stenosis.     L2-L3: Retrolisthesis, degenerative disc disease and eccentric right-sided   disc osteophyte complex, facet hypertrophy, and ligamentum flavum   thickening results in moderate-severe trefoil type central spinal stenosis   and severe right and at least moderate left foraminal stenosis.     L3-L4: Marked degenerative disc disease and circumferential disc   osteophyte complex, facet hypertrophy, and ligamentum flavum thickening   results in severe trefoil type central spinal stenosis and severe right   and at least moderate left foraminal stenosis.     L4-L5: No obvious bony compromise of the spinal canal or foramina.   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     L5-S1: Circumferential disc osteophyte complex and facet hypertrophy   results in severe bilateral foraminal stenosis and at least mild bilateral   lateral recess stenosis.     Marked right iliopsoas and bilateral gluteal (left greater than right)   muscular atrophy.     IMPRESSION       1.  Prior L4-L5 posterior spinal fusion and reported spinal stimulator   lead migration and interval revision, poorly evaluated on the current   imaging modality.   2.  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   3.  Multilevel degenerative foraminal stenosis most pronounced of severe   degree on the right at L2-L3 and L3-4 and bilaterally at L5-S1.   4.  Chronic multilevel spondylolisthesis and left convexity lumbar   curvature.   5.  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   6.  Marked right iliopsoas and bilateral gluteal (left greater than right)   muscular atrophy.       Last Cr and LFT's:  Creatinine   Date Value Ref Range Status   06/15/2022 0.73 0.4 - 1.24 MG/DL Final     Creatinine, POC   Date Value Ref Range Status   07/18/2022 0.7 0.4 - 1.24 MG/DL Final     AST (SGOT)   Date Value Ref Range Status   06/15/2022 22 7 - 40 U/L Final     ALT (SGPT)   Date Value Ref Range Status   06/15/2022 29 7 - 56 U/L Final     Alk Phosphatase   Date Value Ref Range Status   06/15/2022 26 25 - 110 U/L Final     Total Bilirubin   Date Value Ref Range Status   06/15/2022 1.3 (H) 0.3 - 1.2 MG/DL Final          Assessment:    [place] is  a 72 y.o. male who  has a past medical history of Adverse drug reaction, Aneurysm (HCC), Arthritis, Ascending aortic aneurysm (HCC) (01/08/2008), Asymptomatic PVCs (03/11/2015), Blood clotting disorder (HCC) (05 Apr 2017), CAD (coronary artery disease), Carpal tunnel syndrome of left wrist, Cataract, Degenerative disc disease, lumbar, Diabetes (HCC), Dyslipidemia, Erectile dysfunction, Fatty liver, Heart murmur (Nov 2020), HLD (hyperlipidemia) (01/08/2008), anticoagulation, Hypertension (01/08/2008), Hypertriglyceridemia, Idiopathic polyneuropathy, Joint pain (Jul 2008), Lumbar radiculopathy, Nerve injury, NICM (nonischemic cardiomyopathy) (HCC), 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 who presents for evaluation of pain.    The pain complaints are most likely due to:    1. Spinal cord stimulator status  SPINAL CORD STIMULATOR IMPLANT      2. Lumbosacral radiculopathy  SPINAL CORD STIMULATOR IMPLANT              Patient has had an adequate trial of > 12 month of rest, exercise, multimodal treatment, and the passage of time without improvement of symptoms. The pain has significant impact on the daily quality of life.     Plan:    1.Discussed care options with patient.  At this time he would like removal SCS.    2.Continue tizanidine as prescribed  3. Encouraged to continue at home PT program.  4. Follow up after removal        Risks/benefits of all pharmacologic and interventional treatments discussed and questions answered.

## 2022-09-23 ENCOUNTER — Encounter: Admit: 2022-09-23 | Discharge: 2022-09-23 | Payer: MEDICARE

## 2022-09-27 ENCOUNTER — Encounter: Admit: 2022-09-27 | Discharge: 2022-09-27 | Payer: MEDICARE

## 2022-09-27 DIAGNOSIS — E1142 Type 2 diabetes mellitus with diabetic polyneuropathy: Secondary | ICD-10-CM

## 2022-09-27 MED ORDER — OZEMPIC 1 MG/DOSE (4 MG/3 ML) SC PNIJ
1 mg | SUBCUTANEOUS | 1 refills | Status: AC
Start: 2022-09-27 — End: ?

## 2022-09-27 NOTE — Telephone Encounter
Since truliciy is not available due to nationwide backorder patient is in need of ozempic since that is what pharmacy has available.

## 2022-10-01 ENCOUNTER — Encounter: Admit: 2022-10-01 | Discharge: 2022-10-01 | Payer: MEDICARE

## 2022-10-01 MED ORDER — PREGABALIN 200 MG PO CAP
200 mg | ORAL_CAPSULE | Freq: Two times a day (BID) | ORAL | 3 refills
Start: 2022-10-01 — End: ?

## 2022-10-01 NOTE — Telephone Encounter
Received refill request for Lyrica. Medication set forth in plan at LOV on 05/01/22. Medication refill routed to Dr. Meredeth Ide for approval.

## 2022-10-03 ENCOUNTER — Encounter: Admit: 2022-10-03 | Discharge: 2022-10-03 | Payer: MEDICARE

## 2022-10-04 ENCOUNTER — Encounter: Admit: 2022-10-04 | Discharge: 2022-10-04 | Payer: MEDICARE

## 2022-10-10 ENCOUNTER — Encounter: Admit: 2022-10-10 | Discharge: 2022-10-10 | Payer: MEDICARE

## 2022-10-30 ENCOUNTER — Encounter: Admit: 2022-10-30 | Discharge: 2022-10-30 | Payer: MEDICARE

## 2022-10-30 ENCOUNTER — Ambulatory Visit: Admit: 2022-10-30 | Discharge: 2022-10-30 | Payer: MEDICARE

## 2022-10-30 DIAGNOSIS — M47817 Spondylosis without myelopathy or radiculopathy, lumbosacral region: Secondary | ICD-10-CM

## 2022-10-30 DIAGNOSIS — Z9689 Presence of other specified functional implants: Secondary | ICD-10-CM

## 2022-10-30 MED ORDER — CEFAZOLIN 1 GRAM IJ SOLR
2 g | Freq: Once | INTRAVENOUS | 0 refills
Start: 2022-10-30 — End: ?

## 2022-11-01 ENCOUNTER — Ambulatory Visit: Admit: 2022-11-01 | Discharge: 2022-11-01 | Payer: MEDICARE

## 2022-11-01 ENCOUNTER — Encounter: Admit: 2022-11-01 | Discharge: 2022-11-01 | Payer: MEDICARE

## 2022-11-01 ENCOUNTER — Ambulatory Visit: Admit: 2022-11-01 | Discharge: 2022-11-02 | Payer: MEDICARE

## 2022-11-01 DIAGNOSIS — I10 Essential (primary) hypertension: Secondary | ICD-10-CM

## 2022-11-01 DIAGNOSIS — Z86718 Personal history of other venous thrombosis and embolism: Secondary | ICD-10-CM

## 2022-11-01 DIAGNOSIS — K760 Fatty (change of) liver, not elsewhere classified: Secondary | ICD-10-CM

## 2022-11-01 DIAGNOSIS — I729 Aneurysm of unspecified site: Secondary | ICD-10-CM

## 2022-11-01 DIAGNOSIS — I2699 Other pulmonary embolism without acute cor pulmonale: Secondary | ICD-10-CM

## 2022-11-01 DIAGNOSIS — G971 Other reaction to spinal and lumbar puncture: Secondary | ICD-10-CM

## 2022-11-01 DIAGNOSIS — R768 Other specified abnormal immunological findings in serum: Secondary | ICD-10-CM

## 2022-11-01 DIAGNOSIS — I429 Cardiomyopathy, unspecified: Secondary | ICD-10-CM

## 2022-11-01 DIAGNOSIS — T148XXA Other injury of unspecified body region, initial encounter: Secondary | ICD-10-CM

## 2022-11-01 DIAGNOSIS — I2510 Atherosclerotic heart disease of native coronary artery without angina pectoris: Secondary | ICD-10-CM

## 2022-11-01 DIAGNOSIS — H269 Unspecified cataract: Secondary | ICD-10-CM

## 2022-11-01 DIAGNOSIS — M2550 Pain in unspecified joint: Secondary | ICD-10-CM

## 2022-11-01 DIAGNOSIS — Z0000 Encounter for general adult medical examination without abnormal findings: Secondary | ICD-10-CM

## 2022-11-01 DIAGNOSIS — I7121 Ascending aortic aneurysm (HCC): Secondary | ICD-10-CM

## 2022-11-01 DIAGNOSIS — E669 Obesity, unspecified: Secondary | ICD-10-CM

## 2022-11-01 DIAGNOSIS — M5416 Radiculopathy, lumbar region: Secondary | ICD-10-CM

## 2022-11-01 DIAGNOSIS — Z9229 Personal history of other drug therapy: Secondary | ICD-10-CM

## 2022-11-01 DIAGNOSIS — J309 Allergic rhinitis, unspecified: Secondary | ICD-10-CM

## 2022-11-01 DIAGNOSIS — N529 Male erectile dysfunction, unspecified: Secondary | ICD-10-CM

## 2022-11-01 DIAGNOSIS — I351 Nonrheumatic aortic (valve) insufficiency: Secondary | ICD-10-CM

## 2022-11-01 DIAGNOSIS — I428 Other cardiomyopathies: Secondary | ICD-10-CM

## 2022-11-01 DIAGNOSIS — M1990 Unspecified osteoarthritis, unspecified site: Secondary | ICD-10-CM

## 2022-11-01 DIAGNOSIS — C61 Malignant neoplasm of prostate: Secondary | ICD-10-CM

## 2022-11-01 DIAGNOSIS — I872 Venous insufficiency (chronic) (peripheral): Secondary | ICD-10-CM

## 2022-11-01 DIAGNOSIS — I493 Ventricular premature depolarization: Secondary | ICD-10-CM

## 2022-11-01 DIAGNOSIS — G609 Hereditary and idiopathic neuropathy, unspecified: Secondary | ICD-10-CM

## 2022-11-01 DIAGNOSIS — E781 Pure hyperglyceridemia: Secondary | ICD-10-CM

## 2022-11-01 DIAGNOSIS — T50905A Adverse effect of unspecified drugs, medicaments and biological substances, initial encounter: Secondary | ICD-10-CM

## 2022-11-01 DIAGNOSIS — M4800 Spinal stenosis, site unspecified: Secondary | ICD-10-CM

## 2022-11-01 DIAGNOSIS — G5602 Carpal tunnel syndrome, left upper limb: Secondary | ICD-10-CM

## 2022-11-01 DIAGNOSIS — M5136 Other intervertebral disc degeneration, lumbar region: Secondary | ICD-10-CM

## 2022-11-01 DIAGNOSIS — E785 Hyperlipidemia, unspecified: Secondary | ICD-10-CM

## 2022-11-01 DIAGNOSIS — J189 Pneumonia, unspecified organism: Secondary | ICD-10-CM

## 2022-11-01 DIAGNOSIS — C801 Malignant (primary) neoplasm, unspecified: Secondary | ICD-10-CM

## 2022-11-01 DIAGNOSIS — M810 Age-related osteoporosis without current pathological fracture: Secondary | ICD-10-CM

## 2022-11-01 DIAGNOSIS — E119 Type 2 diabetes mellitus without complications: Secondary | ICD-10-CM

## 2022-11-01 DIAGNOSIS — M48062 Spinal stenosis, lumbar region with neurogenic claudication: Secondary | ICD-10-CM

## 2022-11-01 DIAGNOSIS — D689 Coagulation defect, unspecified: Secondary | ICD-10-CM

## 2022-11-01 DIAGNOSIS — R011 Cardiac murmur, unspecified: Secondary | ICD-10-CM

## 2022-11-01 LAB — COMPREHENSIVE METABOLIC PANEL
POTASSIUM: 4.3 MMOL/L (ref <100)
SODIUM: 139 MMOL/L (ref >40)

## 2022-11-01 LAB — CBC
HEMOGLOBIN: 13 g/dL — ABNORMAL LOW (ref 13.5–16.5)
RBC COUNT: 4.3 M/UL — ABNORMAL LOW (ref 4.4–5.5)
WBC COUNT: 3.4 10*3/uL — ABNORMAL LOW (ref 4.5–11.0)

## 2022-11-01 LAB — LIPID PROFILE
CHOLESTEROL: 98 mg/dL (ref <200)
TRIGLYCERIDES: 131 mg/dL (ref <150)

## 2022-11-01 LAB — TSH WITH FREE T4 REFLEX: TSH: 1.5 uU/mL (ref 0.35–5.00)

## 2022-11-01 MED ORDER — EZETIMIBE 10 MG PO TAB
10 mg | ORAL_TABLET | Freq: Every day | ORAL | 3 refills | Status: AC
Start: 2022-11-01 — End: ?

## 2022-11-01 MED ORDER — METFORMIN 1,000 MG PO TAB
1000 mg | ORAL_TABLET | Freq: Two times a day (BID) | ORAL | 3 refills | Status: DC
Start: 2022-11-01 — End: 2022-11-01

## 2022-11-01 MED ORDER — METFORMIN 1,000 MG PO TAB
1000 mg | ORAL_TABLET | Freq: Two times a day (BID) | ORAL | 3 refills | Status: AC
Start: 2022-11-01 — End: ?

## 2022-11-01 NOTE — Progress Notes
Date of Service: 11/01/2022    Victor Graham is a 72 y.o. male.  DOB: [birth year]  MRN: [id number]     Subjective:             History of Present Illness  Patient is here for his Health Maintenance Exam.    Patient also sees providers through the [place].  He reports he is having difficulty with his back as well as mobility.  He has been working with Neurosurgery and Pain Clinic at [facility] but is planning to seek the opinion of the [place].  He has had previous venous stasis ulcers of his legs but they have resolved.  He continues with venous stasis but wears compression garments.  He has a known history of DM, HTN and HLD.  He is followed by Cardiology for CAD, s/p Thoracic AA repair and Cardiomyopathy.  He also has a history of prostate cancer and is followed by an outside Oncology team.    Diet adherence:most of the time   Medication adherence:most of the time     Diabetes Management:  The patient has not had hypoglycemic reactions  Lab Results   Component Value Date/Time    HGBA1C 8.5 (H) 10/30/2021 10:51 AM    HGBA1C 6.9 (H) 10/24/2020 10:42 AM    HGBA1C 6.9 (H) 06/01/2020 02:06 PM    A1C 6.5 (A) 08/30/2022 12:00 AM    HGBPOC 12.9 (L) 07/07/2008 11:49 AM    CHOL 98 11/01/2022 08:35 AM    TRIG 131 11/01/2022 08:35 AM    HDL 42 11/01/2022 08:35 AM    LDL 44 11/01/2022 08:35 AM    VLDL 26 11/01/2022 08:35 AM    NONHDLCHOL 56 11/01/2022 08:35 AM    CR 0.66 11/01/2022 08:35 AM    MCALBR 9.7 11/15/2021 10:36 AM    ALT 26 11/01/2022 08:35 AM    CK 255 (H) 01/06/2019 03:47 PM    VITD25 46.5 10/20/2019 09:03 AM        Microalbumin tested in last 12 months?  Yes  Eye exam within the last 12 months? Yes  Comprehensive Foot exam within the last 12 months? Yes  Pneumonia shot current? Yes  The patient is taking an ACE inhibitor or an ARB:Yes   The patient is taking a statin:Yes    Hypertension Management:    Outside blood pressures being performed: Yes  BP Readings from Last 3 Encounters:   11/01/22 102/64   09/20/22 93/63   09/11/22 135/76     He denies significant light-headedness.    Hyperlipidemia Management  Side effects to medications? No               Assessment and Plan:  Health Maintenance Exam  - fasting labs ordered  - counseled the patient on importance of maintaining a healthy diet and regular exercise  - reviewed immunizations.    - CBC; Future  - COMPREHENSIVE METABOLIC PANEL; Future  - LIPID PROFILE; Future  - TSH WITH FREE T4 REFLEX; Future    2. Type 2 diabetes mellitus with hyperglycemia, without long-term current use of insulin (HCC)  3. Type 2 diabetes mellitus with diabetic polyneuropathy, without long-term current use of insulin (HCC)  Diabetes Mellitus    Plan:   Discussed general issues about diabetes pathophysiology and management.  Discussed exercise management and diet with emphasis on vegetables, fruit and lean meat.  Discussed foot care.  Reminded to get retinal exam annually and dental appointment every 6 months.  Treatment  goals: A1C < or = 7.0       BP <140/90  Are barriers to achieving goals present? No  Medication education provided. Patient voiced understanding? Yes  Patient able to self-manage and ready to comply? Yes  Educational resources identified? Verbal Counseling  - at this time, he is going to stay with 1mg  of Ozempic weekly and his current dosage of Metformin.  He is going to return in 3 months and we can consider increasing his Ozempic and holding his Metformin.  I reviewed with the patient their current medications and specifically any new medications prescribed at the time of this visit and we reviewed the expected benefits and potential side effects. All questions are answered to the patient's satisfaction.    - metFORMIN (GLUCOPHAGE) 1,000 mg tablet; Take one tablet by mouth twice daily with meals.  Dispense: 180 tablet; Refill: 3    4. Hyperlipidemia, unspecified hyperlipidemia type  Hyperlipidemia    Plan:  Discussed labs and reviewed goals for LDL, HDL, triglycerides. Discussed exercise management and diet with emphasis on vegetables, fruit and lean meat.  Are barriers to achieving goals present? No  Medication education provided. Patient voiced understanding? Yes  Patient able to self-manage and ready to comply?Yes  Educational resources identified? Verbal Counseling      - ezetimibe (ZETIA) 10 mg tablet; Take one tablet by mouth daily.  Dispense: 90 tablet; Refill: 3    5. Essential hypertension  Hypertension    Plan:   Discussed hypertension and reviewed goals.  Are barriers to achieving goals present? No  Medication education provided. Patient voiced understanding? Yes  Patient able to self-manage and ready to comply? Yes  Educational resources identified? Verbal Counseling     6. Cardiomyopathy, idiopathic (HCC)  7. Coronary artery disease involving native heart without angina pectoris, unspecified vessel or lesion type  - I have reviewed the patient's medical history in detail and updated the computerized patient record.  - continue with Cardiology.   I appreciate their assistance in care of the patient.     8. Lumbar stenosis with neurogenic claudication  - I have reviewed the patient's medical history in detail and updated the computerized patient record.   I discussed with patient conservative measures including ice, rest, appropriate medications, activity modification.   - I reviewed signs/symptoms to monitor for.  - Continue with Neurosurgery and Pain Clinic as well as the [place].   I appreciate their assistance in care of the patient.       9. History of deep venous thrombosis  - I have reviewed the patient's medical history in detail and updated the computerized patient record.     10. Malignant neoplasm of prostate (HCC)  - I have reviewed the patient's medical history in detail and updated the computerized patient record.  -continue with Oncology.   I appreciate their assistance in care of the patient.                  Past Medical History:   Diagnosis Date Adverse drug reaction     Aneurysm (HCC)     Arthritis     Ascending aortic aneurysm (HCC) 01/08/2008    Asymptomatic PVCs 03/11/2015    03/2015 per pt, has had them for a long time & does not feel them usually     Blood clotting disorder (HCC) 05 Apr 2017    hex lupus anticoagulant    CAD (coronary artery disease)     Carpal tunnel syndrome of left  wrist     emg by Dr Lynnae Sandhoff at [facility] shows severe median neuropathy    Cataract     Cateract Surgery Dec 2022    Degenerative disc disease, lumbar     Diabetes (HCC)     Dyslipidemia     Erectile dysfunction     Fatty liver     Heart murmur Nov 2020    HLD (hyperlipidemia) 01/08/2008    HX: anticoagulation     Hypertension 01/08/2008    Hypertriglyceridemia     Idiopathic polyneuropathy     Joint pain Jul 2008    Lumbar radiculopathy     Nerve injury     NICM (nonischemic cardiomyopathy) (HCC)     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    09/2015 Mild on prior assessments     Obesity     Osteoporosis     Other malignant neoplasm without specification of site Dec 2023    Pneumonia 2008    Positive ANA (antinuclear antibody) 02/03/2016    Positive ANA (antinuclear antibody) 02/03/2016    Pulmonary embolism (HCC) 2015 -- july 20th    takes xaralto anticoagulant -- treated at [facility]    Spinal headache May 1993    Spinal stenosis 04 Sep 2015     Surgical History:   Procedure Laterality Date    FASCIOTOMY Right 1992    KNEE SURGERY Right 02/2007    Torn meniscus removed    HX LUMBAR FUSION  02/2008    L4-5    AAA REPAIR  06/2008    HX JOINT REPLACEMENT Right 06/2010    HERNIA REPAIR  01/2013    x2    KNEE REPLACEMENT Left 03/2013    CYST REMOVAL Left 06/2013    wrist    STIMULATOR IMPLANT  12/2014    Spinal stimulator [employer]    CYST REMOVAL Right 08/2017    wrist    SHOULDER SURGERY  08/2018    reversed arthroplasty    COLONOSCOPY DIAGNOSTIC WITH SPECIMEN COLLECTION BY BRUSHING/ WASHING - FLEXIBLE N/A 03/18/2019    Performed by Lenor Derrick, MD at [facility] OR    COLONOSCOPY WITH CONTROL OF BLEEDING N/A 03/18/2019    Performed by Lenor Derrick, MD at [facility] OR    COLONOSCOPY WITH SUBMUCOSAL INJECTION N/A 03/18/2019    Performed by Lenor Derrick, MD at [facility] OR    *Abbott Battery Revision* REMOVAL/ REVISION SPINAL NEUROSTIMULATOR PULSE GENERATOR/ RECEIVER Bilateral 12/22/2021    Performed by Gabriel Rung, MD at [facility] OR    REVISION/ REPLACEMENT SPINAL NEUROSTIMULATOR ELECTRODE PERCUTANEOUS ARRAY WITH FLUOROSCOPY Bilateral 12/22/2021    Performed by Gabriel Rung, MD at [facility] OR    ELECTRONIC ANALYSIS IMPLANTED NEUROSTIMULATOR PULSE GENERATOR SYSTEM - COMPLEX SPINAL CORD/ PERIPHERAL NEUROSTIMULATOR PULSE GENERATOR/ TRANSMITTER WITH INTRA OPERATIVE/ SUBSEQUENT PROGRAMING Bilateral 12/22/2021    Performed by Gabriel Rung, MD at [facility] OR    CARDIOVASCULAR STRESS TEST      ECHOCARDIOGRAM PROCEDURE      HX ARTHROSCOPIC SURGERY      HX TONSILLECTOMY  1956    PR LAPAROSCOPY SURG RPR INITIAL INGUINAL HERNIA      UMBILICAL ARTERIAL CATH - BEDSIDE       Family History   Problem Relation Name Age of Onset    DVT Mother Sylvanus Hullum     Arthritis Mother Dakwon Rippon     Joint Pain Mother Aseem Merisier     Cancer Father  Gareth Morgan         Prostate cancer    DVT Brother      Stroke Paternal Grandfather [name]     Cancer Paternal Grandmother [name]     Arthritis Paternal Grandmother Canine [place]     Stroke Maternal Grandfather [name]     Cancer Maternal Grandmother [name], applications         Colon Cancer     Social History     Socioeconomic History    Marital status: Married   Tobacco Use    Smoking status: Former     Current packs/day: 0.00     Average packs/day: 2.0 packs/day for 15.0 years (30.0 ttl pk-yrs)     Types: Cigarettes     Start date: 08/23/1976     Quit date: 08/24/1991     Years since quitting: 31.2     Passive exposure: Past    Smokeless tobacco: Never   Vaping Use    Vaping status: Never Used   Substance and Sexual Activity    Alcohol use: No    Drug use: No    Sexual activity: Not Currently     Partners: Female     Birth control/protection: None   Other Topics Concern    Financial planner Yes     CommentPersonnel officer, [employer]        Review of Systems      Objective:          alendronate (FOSAMAX) 70 mg tablet Take one tablet by mouth every 7 days.    ezetimibe (ZETIA) 10 mg tablet Take one tablet by mouth daily.    furosemide (LASIX) 20 mg tablet Take one tablet by mouth daily as needed.    leuprolide 3 month (LUPRON DEPOT (3 MONTH)) 11.25 mg/1.5 mL injection     lisinopriL (ZESTRIL) 10 mg tablet Take one tablet by mouth daily.    [START ON 02/01/2023] metFORMIN (GLUCOPHAGE) 1,000 mg tablet Take one tablet by mouth twice daily with meals.    metoprolol succinate XL (TOPROL XL) 50 mg extended release tablet Take one tablet by mouth daily. Take 25 mg daily for one week then 50 mg daily    omega-3 fatty acids/fish oil (FISH OIL /OMEGA-3 FATTY ACIDS) 1000 mg/3.125 mL soln Take 3.125 mL by mouth.    POTASSIUM CHLORIDE PO Take 90 mg by mouth twice daily.    pregabalin (LYRICA) 200 mg capsule Take one capsule by mouth twice daily.    rivaroxaban (XARELTO) 20 mg tablet Take one tablet by mouth daily with dinner.    rosuvastatin (CRESTOR) 20 mg tablet Take one tablet by mouth daily.    semaglutide (OZEMPIC) 1 mg/dose (4 mg/3 mL) injection PEN Inject one mg under the skin every 7 days.    spironolactone (ALDACTONE) 25 mg tablet Take one tablet by mouth daily.    tamsulosin (FLOMAX) 0.4 mg capsule     tiZANidine (ZANAFLEX) 2 mg tablet Take two tablets by mouth three times daily.    traMADoL (ULTRAM) 50 mg tablet Take one tablet by mouth every 8 hours as needed.     Vitals:    11/01/22 0750   BP: 102/64   Pulse: 72   Temp: 36.9 ?C (98.4 ?F)   Resp: 16   SpO2: 98%   PainSc: Four   Weight: 114.4 kg (252 lb 3.2 oz)   Height: 177.8 cm (5' 10)     Body mass index is 36.19 kg/m?Marland Kitchen  Physical Exam  Vitals reviewed.   Constitutional: Appearance: Normal appearance.   HENT:      Head: Normocephalic and atraumatic.      Right Ear: Tympanic membrane, ear canal and external ear normal.      Left Ear: Tympanic membrane, ear canal and external ear normal.      Nose: Nose normal.      Mouth/Throat:      Mouth: Mucous membranes are moist.   Eyes:      Extraocular Movements: Extraocular movements intact.      Conjunctiva/sclera: Conjunctivae normal.   Cardiovascular:      Rate and Rhythm: Normal rate and regular rhythm.      Heart sounds: Normal heart sounds. No murmur heard.  Pulmonary:      Effort: Pulmonary effort is normal.      Breath sounds: Normal breath sounds. No wheezing.   Musculoskeletal:      Cervical back: Neck supple. No muscular tenderness.      Comments: Diabetic Foot Exam       Bilateral vascular, sensation, integument are normal:  No    Vascular Status   Left:normal Right:normal  Monofilament Testing   Left:Diminished Right:Diminished  Sensation   Left:pinprick sensation diminished, Right:pinprick sensation diminished,  Skin Integrity   Left:Normal Right:Callous  Foot Structure   Left:Normal Right:Normal       Lymphadenopathy:      Cervical: No cervical adenopathy.   Skin:     General: Skin is warm.      Capillary Refill: Capillary refill takes less than 2 seconds.      Findings: No rash.   Neurological:      Mental Status: He is alert. Mental status is at baseline.      Motor: Weakness present.      Coordination: Coordination abnormal.      Gait: Gait abnormal.   Psychiatric:         Mood and Affect: Mood normal.         Behavior: Behavior normal.         Judgment: Judgment normal.               Health Risk Assessment Questionnaire  Current Care  List of Providers you have seen in the last two years: Dr. Abel Presto, Dr Samara Deist,  Iver Nestle, Dr Governor Rooks, Dr Renelda Loma, Dr. Vita Barley, Dr. Jenita Seashore,  Are you receiving home health?: No  During the past 4 weeks, how would you rate your health in general?: (!) Fair    Outside Care  Since your last PCP visit, have you received care outside of [facility]?: (!) Yes  What type of care did you receive outside of [facility]? (select all that apply): (!) Specialty Visit, Emergency Room Visit, Hospitalization  What is the Facility where you received care and the provider's name?: [facility], [place], [place] (ER & Hospitalization);[facility], [place], [place] (Hospitalization);[facility], [place], [place] (Specialty)    Physical Activity  Do you exercise or are you physically active?: (!) No          Diet  In the past month, were you worried whether your food would run out before you or your family had money to buy more?: No  In the past 7 days, how many times did you eat fast food or junk food or pizza?: (!) 2  In the past 7 days, how many servings of fruits or vegetables did  you eat each day?: (!) 2-3  In the past 7 days, how many sodas and sugar sweetened drinks (regular, not diet) did you drink each day?: 0    Smoke/Tobacco Use  Are you currently a smoker?: No      Alcohol Use  Do you drink alcohol?: No          Depression Screen  Little interest or pleasure in doing things: Not at all  Feeling down, depressed or hopeless: Not at all  Completed in no more than 15 minutes      Pain  How would you rate your pain today?: (!) Moderate pain    Ambulation  Do you use any assistive devices for ambulation?: (!) Yes  What types of device? (select all that apply): Ephraim Hamburger    Fall Risk  Does it take you longer than 30 seconds to get up and out of a chair?: No  Have you fallen in the past year?: (!) Yes  Fall History (last 22mo): (!) Two or More Falls    Motor Vehicle Safety  Do you fasten your seat belt when you are in the car?: Yes    Sun Exposure  Do you protect yourself from the sun? For example, wear sunscreen when outside.: (!) No    Hearing Loss  Do you have trouble hearing the television or radio when others do not?: No  Do you have to strain or struggle to hear/understand conversation?: No  Do you use hearing aids?: No    Cognitive Impairment  During the past 12 months, have you experienced confusion or memory loss that is happening more often or is getting worse?: No    Functional Screen  Do you live alone?: No  Do you live at: Home  Can you drive your own car or travel alone by bus or taxi?: Yes  Can you shop for groceries or clothes without help?: Yes  Can you prepare your own meals?: Yes  Can you do your own housework without help?: Yes  Can you handle your own money without help?: Yes  Do you need help eating, bathing, dressing, or getting around your home?: No  Do you feel safe?: Yes  Does anyone at home hurt you, hit you, or threaten you?: No  Have you ever been the victim of abuse?: No    Home Safety  Does your home have grab bars in the bathroom?: Yes  Does your home have hand rails on stairs and steps?: Yes  Does your home have functioning smoke alarms?: Yes    Advance Directive  Do you have a living will or Advance Directive?: Yes      Dental Screen  Have you had an exam by your dentist in the last year?: Yes    Vision Screen  Do you have diabetes?: (!) Yes  When was your last eye exam?: 10/04/22  Eye Doctor Name?: Dr. Vita Barley  Eye Doctor Facility?: [facility], [place], [place]

## 2022-11-02 ENCOUNTER — Encounter: Admit: 2022-11-02 | Discharge: 2022-11-02 | Payer: MEDICARE

## 2022-11-02 DIAGNOSIS — E119 Type 2 diabetes mellitus without complications: Secondary | ICD-10-CM

## 2022-11-02 DIAGNOSIS — I428 Other cardiomyopathies: Secondary | ICD-10-CM

## 2022-11-02 DIAGNOSIS — H269 Unspecified cataract: Secondary | ICD-10-CM

## 2022-11-02 DIAGNOSIS — E1142 Type 2 diabetes mellitus with diabetic polyneuropathy: Secondary | ICD-10-CM

## 2022-11-02 DIAGNOSIS — R768 Other specified abnormal immunological findings in serum: Secondary | ICD-10-CM

## 2022-11-02 DIAGNOSIS — I729 Aneurysm of unspecified site: Secondary | ICD-10-CM

## 2022-11-02 DIAGNOSIS — I7121 Ascending aortic aneurysm (HCC): Secondary | ICD-10-CM

## 2022-11-02 DIAGNOSIS — J189 Pneumonia, unspecified organism: Secondary | ICD-10-CM

## 2022-11-02 DIAGNOSIS — I351 Nonrheumatic aortic (valve) insufficiency: Secondary | ICD-10-CM

## 2022-11-02 DIAGNOSIS — R011 Cardiac murmur, unspecified: Secondary | ICD-10-CM

## 2022-11-02 DIAGNOSIS — I493 Ventricular premature depolarization: Secondary | ICD-10-CM

## 2022-11-02 DIAGNOSIS — I10 Essential (primary) hypertension: Secondary | ICD-10-CM

## 2022-11-02 DIAGNOSIS — M5136 Other intervertebral disc degeneration, lumbar region: Secondary | ICD-10-CM

## 2022-11-02 DIAGNOSIS — Z0000 Encounter for general adult medical examination without abnormal findings: Secondary | ICD-10-CM

## 2022-11-02 DIAGNOSIS — I2699 Other pulmonary embolism without acute cor pulmonale: Secondary | ICD-10-CM

## 2022-11-02 DIAGNOSIS — T50905A Adverse effect of unspecified drugs, medicaments and biological substances, initial encounter: Secondary | ICD-10-CM

## 2022-11-02 DIAGNOSIS — G609 Hereditary and idiopathic neuropathy, unspecified: Secondary | ICD-10-CM

## 2022-11-02 DIAGNOSIS — G971 Other reaction to spinal and lumbar puncture: Secondary | ICD-10-CM

## 2022-11-02 DIAGNOSIS — R899 Unspecified abnormal finding in specimens from other organs, systems and tissues: Secondary | ICD-10-CM

## 2022-11-02 DIAGNOSIS — Z9229 Personal history of other drug therapy: Secondary | ICD-10-CM

## 2022-11-02 DIAGNOSIS — G5602 Carpal tunnel syndrome, left upper limb: Secondary | ICD-10-CM

## 2022-11-02 DIAGNOSIS — E669 Obesity, unspecified: Secondary | ICD-10-CM

## 2022-11-02 DIAGNOSIS — M4800 Spinal stenosis, site unspecified: Secondary | ICD-10-CM

## 2022-11-02 DIAGNOSIS — C801 Malignant (primary) neoplasm, unspecified: Secondary | ICD-10-CM

## 2022-11-02 DIAGNOSIS — E781 Pure hyperglyceridemia: Secondary | ICD-10-CM

## 2022-11-02 DIAGNOSIS — E1165 Type 2 diabetes mellitus with hyperglycemia: Secondary | ICD-10-CM

## 2022-11-02 DIAGNOSIS — M2550 Pain in unspecified joint: Secondary | ICD-10-CM

## 2022-11-02 DIAGNOSIS — M810 Age-related osteoporosis without current pathological fracture: Secondary | ICD-10-CM

## 2022-11-02 DIAGNOSIS — M1990 Unspecified osteoarthritis, unspecified site: Secondary | ICD-10-CM

## 2022-11-02 DIAGNOSIS — E785 Hyperlipidemia, unspecified: Secondary | ICD-10-CM

## 2022-11-02 DIAGNOSIS — D689 Coagulation defect, unspecified: Secondary | ICD-10-CM

## 2022-11-02 DIAGNOSIS — K760 Fatty (change of) liver, not elsewhere classified: Secondary | ICD-10-CM

## 2022-11-02 DIAGNOSIS — M5416 Radiculopathy, lumbar region: Secondary | ICD-10-CM

## 2022-11-02 DIAGNOSIS — T148XXA Other injury of unspecified body region, initial encounter: Secondary | ICD-10-CM

## 2022-11-02 DIAGNOSIS — D649 Anemia, unspecified: Secondary | ICD-10-CM

## 2022-11-02 DIAGNOSIS — N529 Male erectile dysfunction, unspecified: Secondary | ICD-10-CM

## 2022-11-02 DIAGNOSIS — I2510 Atherosclerotic heart disease of native coronary artery without angina pectoris: Secondary | ICD-10-CM

## 2022-11-09 ENCOUNTER — Encounter: Admit: 2022-11-09 | Discharge: 2022-11-09 | Payer: MEDICARE

## 2022-11-09 ENCOUNTER — Ambulatory Visit: Admit: 2022-11-09 | Discharge: 2022-11-09 | Payer: MEDICARE

## 2022-11-09 DIAGNOSIS — D689 Coagulation defect, unspecified: Secondary | ICD-10-CM

## 2022-11-09 DIAGNOSIS — H269 Unspecified cataract: Secondary | ICD-10-CM

## 2022-11-09 DIAGNOSIS — I493 Ventricular premature depolarization: Secondary | ICD-10-CM

## 2022-11-09 DIAGNOSIS — R011 Cardiac murmur, unspecified: Secondary | ICD-10-CM

## 2022-11-09 DIAGNOSIS — R768 Other specified abnormal immunological findings in serum: Secondary | ICD-10-CM

## 2022-11-09 DIAGNOSIS — M1990 Unspecified osteoarthritis, unspecified site: Secondary | ICD-10-CM

## 2022-11-09 DIAGNOSIS — E785 Hyperlipidemia, unspecified: Secondary | ICD-10-CM

## 2022-11-09 DIAGNOSIS — E119 Type 2 diabetes mellitus without complications: Secondary | ICD-10-CM

## 2022-11-09 DIAGNOSIS — T148XXA Other injury of unspecified body region, initial encounter: Secondary | ICD-10-CM

## 2022-11-09 DIAGNOSIS — J189 Pneumonia, unspecified organism: Secondary | ICD-10-CM

## 2022-11-09 DIAGNOSIS — I351 Nonrheumatic aortic (valve) insufficiency: Secondary | ICD-10-CM

## 2022-11-09 DIAGNOSIS — I7121 Ascending aortic aneurysm (HCC): Secondary | ICD-10-CM

## 2022-11-09 DIAGNOSIS — E669 Obesity, unspecified: Secondary | ICD-10-CM

## 2022-11-09 DIAGNOSIS — M5416 Radiculopathy, lumbar region: Secondary | ICD-10-CM

## 2022-11-09 DIAGNOSIS — I2510 Atherosclerotic heart disease of native coronary artery without angina pectoris: Secondary | ICD-10-CM

## 2022-11-09 DIAGNOSIS — G5602 Carpal tunnel syndrome, left upper limb: Secondary | ICD-10-CM

## 2022-11-09 DIAGNOSIS — M2550 Pain in unspecified joint: Secondary | ICD-10-CM

## 2022-11-09 DIAGNOSIS — M4800 Spinal stenosis, site unspecified: Secondary | ICD-10-CM

## 2022-11-09 DIAGNOSIS — K760 Fatty (change of) liver, not elsewhere classified: Secondary | ICD-10-CM

## 2022-11-09 DIAGNOSIS — T50905A Adverse effect of unspecified drugs, medicaments and biological substances, initial encounter: Secondary | ICD-10-CM

## 2022-11-09 DIAGNOSIS — G971 Other reaction to spinal and lumbar puncture: Secondary | ICD-10-CM

## 2022-11-09 DIAGNOSIS — M5136 Other intervertebral disc degeneration, lumbar region: Secondary | ICD-10-CM

## 2022-11-09 DIAGNOSIS — Z9229 Personal history of other drug therapy: Secondary | ICD-10-CM

## 2022-11-09 DIAGNOSIS — I10 Essential (primary) hypertension: Secondary | ICD-10-CM

## 2022-11-09 DIAGNOSIS — I428 Other cardiomyopathies: Secondary | ICD-10-CM

## 2022-11-09 DIAGNOSIS — C801 Malignant (primary) neoplasm, unspecified: Secondary | ICD-10-CM

## 2022-11-09 DIAGNOSIS — I729 Aneurysm of unspecified site: Secondary | ICD-10-CM

## 2022-11-09 DIAGNOSIS — I2699 Other pulmonary embolism without acute cor pulmonale: Secondary | ICD-10-CM

## 2022-11-09 DIAGNOSIS — N529 Male erectile dysfunction, unspecified: Secondary | ICD-10-CM

## 2022-11-09 DIAGNOSIS — G609 Hereditary and idiopathic neuropathy, unspecified: Secondary | ICD-10-CM

## 2022-11-09 DIAGNOSIS — M810 Age-related osteoporosis without current pathological fracture: Secondary | ICD-10-CM

## 2022-11-09 DIAGNOSIS — E781 Pure hyperglyceridemia: Secondary | ICD-10-CM

## 2022-11-09 MED ORDER — DEXMEDETOMIDINE IN 0.9 % NACL 80 MCG/20 ML (4 MCG/ML) IV SOLN
INTRAVENOUS | 0 refills | Status: DC
Start: 2022-11-09 — End: 2022-11-09

## 2022-11-09 MED ORDER — LIDOCAINE (PF) 100 MG/5 ML (2 %) IV SYRG
INTRAVENOUS | 0 refills | Status: DC
Start: 2022-11-09 — End: 2022-11-09

## 2022-11-09 MED ORDER — EPHEDRINE SULFATE 50 MG/5ML SYR (10 MG/ML) (AN)(OSM)
INTRAVENOUS | 0 refills | Status: DC
Start: 2022-11-09 — End: 2022-11-09

## 2022-11-09 MED ORDER — PROPOFOL 10 MG/ML IV EMUL 50 ML (INFUSION)(AM)(OR)
INTRAVENOUS | 0 refills | Status: DC
Start: 2022-11-09 — End: 2022-11-09

## 2022-11-09 MED ORDER — FENTANYL CITRATE (PF) 50 MCG/ML IJ SOLN
INTRAVENOUS | 0 refills | Status: DC
Start: 2022-11-09 — End: 2022-11-09

## 2022-11-09 MED ORDER — PHENYLEPHRINE HCL 10 MG/ML IJ SOLN
INTRAVENOUS | 0 refills | Status: DC
Start: 2022-11-09 — End: 2022-11-09

## 2022-11-09 MED ORDER — MIDAZOLAM 1 MG/ML IJ SOLN
INTRAVENOUS | 0 refills | Status: DC
Start: 2022-11-09 — End: 2022-11-09

## 2022-11-09 NOTE — Anesthesia Post-Procedure Evaluation
Post-Anesthesia Evaluation    Name: Victor Graham      MRN: [id number]     DOB: 1950/11/24     Age: 72 y.o.     Sex: male   __________________________________________________________________________     Procedure Information       Anesthesia Start Date/Time: 11/09/22 0721    Procedures:       *Abbott SCS Explant* REMOVAL IMPLANTED SPINAL NEUROSTIMULATOR PULSE GENERATOR/ RECEIVER- WITH DETACHABLE CONNECTION ELECTRODE ARRAY (Bilateral: Back)      REMOVAL SPINAL NEUROSTIMULATOR ELECTRODE PERCUTANEOUS ARRAY (Bilateral: Back)    [facility] / [facility] OR    Surgeons: Gabriel Rung, MD            Post-Anesthesia Vitals  BP: 105/64 (08/02 0845)  Temp: 36.3 ?C (97.4 ?F) (08/02 9563)  Pulse: 58 (08/02 0845)  Respirations: 22 PER MINUTE (08/02 0845)  SpO2: 93 % (08/02 0845)  O2 Device: None (Room air) (08/02 0845)  Height: 177.8 cm (5' 10) (08/02 0600)   Vitals Value Taken Time   BP 105/64 11/09/22 0845   Temp 36.3 ?C (97.4 ?F) 11/09/22 0755   Pulse 58 11/09/22 0845   Respirations 22 PER MINUTE 11/09/22 0845   SpO2 93 % 11/09/22 0845   O2 Device None (Room air) 11/09/22 0845   ABP     ART BP           Post Anesthesia Evaluation Note    Evaluation location: Pre/Post  Patient participation: recovered; patient participated in evaluation  Level of consciousness: alert  Pain management: adequate    Hydration: normovolemia  Temperature: 36.0?C - 38.4?C  Airway patency: adequate    Perioperative Events      Postoperative Status  Cardiovascular status: hemodynamically stable  Respiratory status: spontaneous ventilation        Perioperative Events  There were no known complications for this encounter.

## 2022-11-09 NOTE — Anesthesia Pre-Procedure Evaluation
Anesthesia Pre-Procedure Evaluation    Name: Victor Graham      MRN: [id number]     DOB: 12/08/1950     Age: 72 y.o.     Sex: male   _________________________________________________________________________     Procedure Info:   Procedure Information       Date/Time: 11/09/22 0730    Procedures:       *[employer] SCS Explant* REMOVAL IMPLANTED SPINAL NEUROSTIMULATOR PULSE GENERATOR/ RECEIVER- WITH DETACHABLE CONNECTION ELECTRODE ARRAY (Bilateral)      REMOVAL SPINAL NEUROSTIMULATOR ELECTRODE PERCUTANEOUS ARRAY (Bilateral)    Location: [facility] / [facility] OR    Surgeons: Gabriel Rung, MD            Physical Assessment  Vital Signs (last filed in past 24 hours):  BP: 95/54 (08/02 0637)  Temp: 36.3 ?C (97.3 ?F) (08/02 4782)  Pulse: 68 (08/02 0637)  Respirations: 15 PER MINUTE (08/02 0637)  SpO2: 95 % (08/02 0637)  O2 Device: None (Room air) (08/02 0637)  Height: 177.8 cm (5' 10) (08/02 0600)  Weight: 114.8 kg (253 lb) (08/02 0600)      Patient History   Allergies   Allergen Reactions    Atorvastatin FLUSHING (SKIN), SEE COMMENTS and HIVES     rash      Niacin FLUSHING (SKIN) and SEE COMMENTS     Other reaction(s): Red flush, itching  Other reaction(s): itching/redness    Other [Unclassified Drug] BLISTERS     dissolving stiches in mouth after dental surgery caused blisters    Rubber SEE COMMENTS     Neoprene rubber causes contact dermatitis (CAUSED BY THIOUREA IN NEOPRENE)        Current Medications    Medication Directions   alendronate (FOSAMAX) 70 mg tablet Take one tablet by mouth every 7 days.   ezetimibe (ZETIA) 10 mg tablet Take one tablet by mouth daily.   furosemide (LASIX) 20 mg tablet Take one tablet by mouth daily as needed.   leuprolide 3 month (LUPRON DEPOT (3 MONTH)) 11.25 mg/1.5 mL injection    lisinopriL (ZESTRIL) 10 mg tablet Take one tablet by mouth daily.   metFORMIN (GLUCOPHAGE) 1,000 mg tablet Take one tablet by mouth twice daily with meals.   metoprolol succinate XL (TOPROL XL) 50 mg extended release tablet Take one tablet by mouth daily. Take 25 mg daily for one week then 50 mg daily   omega-3 fatty acids/fish oil (FISH OIL /OMEGA-3 FATTY ACIDS) 1000 mg/3.125 mL soln Take 3.125 mL by mouth.   POTASSIUM CHLORIDE PO Take 90 mg by mouth twice daily.   pregabalin (LYRICA) 200 mg capsule Take one capsule by mouth twice daily.   rivaroxaban (XARELTO) 20 mg tablet Take one tablet by mouth daily with dinner.   rosuvastatin (CRESTOR) 20 mg tablet Take one tablet by mouth daily.   semaglutide (OZEMPIC) 1 mg/dose (4 mg/3 mL) injection PEN Inject one mg under the skin every 7 days.   spironolactone (ALDACTONE) 25 mg tablet Take one tablet by mouth daily.   tamsulosin (FLOMAX) 0.4 mg capsule    tiZANidine (ZANAFLEX) 2 mg tablet Take two tablets by mouth three times daily.   traMADoL (ULTRAM) 50 mg tablet Take one tablet by mouth every 8 hours as needed.         Review of Systems/Medical History      Patient summary reviewed  Nursing notes reviewed  Pertinent labs reviewed    PONV Screening: Non-smoker    No  history of anesthetic complications    No family history of anesthetic complications        Pulmonary           no COPD       Hx pulmonary embolus      Cardiovascular       Recent diagnostic studies:          echocardiogram          2019 Echo: Overall left ventricular systolic function is normal. EF~ 55%  Abnormal septal motion  Valves appear structurally normal without signficant stenosis or regurgitation  No significant pericardial effusion  Normal ventricular chamber dimensions  Mild left and right atrial enlargement  Normal LV wall thickness  Normal diastolic function  Mild aortic root dilation  Estimated peak systolic PA pressure =  25 mmHg       Exercise tolerance: >4 METS      Hypertension          Valvular problems/murmurs:  AI          No past MI    Coronary artery disease            Dysrhythmias (PVC's)    No angina      No DVT      No indications/hx of CHF        GI/Hepatic/Renal No GERD        Liver disease (Fatty liver):         No renal disease:           Neuro/Psych       No seizures        No CVA      Headaches      Neuropathy      Musculoskeletal - negative        Arthritis:         Endocrine/Other       Diabetes, type 2, using insulin      Most recent Hgb A1C:7.2 - 9        No hypothyroidism        Obesity (on trulicity): Class 2 (BMI 35-39.9)         Physical Exam    Airway Findings      Mallampati: III      TM distance: >3 FB      Neck ROM: full      Mouth opening: good      Airway patency: adequate    Dental Findings: Negative            Cardiovascular Findings: Negative      Rhythm: regular      Rate: normal    Pulmonary Findings: Negative      Breath sounds clear to auscultation.    Abdominal Findings:       Obese    Neurological Findings: Negative      Alert and oriented x 3       Diagnostic Tests  Hematology:   Lab Results   Component Value Date    HGB 13.1 11/01/2022    HCT 39.9 11/01/2022    PLTCT 114 11/01/2022    WBC 3.4 11/01/2022    NEUT 50 06/15/2022    ANC 2.07 06/15/2022    ALC 1.49 06/15/2022    MONA 11 06/15/2022    AMC 0.47 06/15/2022    EOSA 2 06/15/2022    ABC 0.03 06/15/2022    MCV 91.5 11/01/2022    MCH 30.1 11/01/2022    MCHC 32.9  11/01/2022    MPV 9.0 11/01/2022    RDW 15.8 11/01/2022         General Chemistry:   Lab Results   Component Value Date    NA 139 11/01/2022    K 4.3 11/01/2022    CL 106 11/01/2022    CO2 24 11/01/2022    GAP 9 11/01/2022    BUN 26 11/01/2022    CR 0.66 11/01/2022    GLU 141 11/01/2022    CA 9.2 11/01/2022    ALBUMIN 3.9 11/01/2022    MG 2.2 03/30/2016    TOTBILI 0.9 11/01/2022    PO4 3.5 03/30/2016      Coagulation:   Lab Results   Component Value Date    PTT 27.1 07/07/2008    INR 1.4 11/09/2013         Anesthesia Plan    ASA score: 3   Plan: MAC  Induction method: intravenous  NPO status: acceptable      Informed Consent  Anesthetic plan and risks discussed with patient.        Plan discussed with: CRNA.    PAC Plan    Alerts

## 2022-11-12 ENCOUNTER — Encounter: Admit: 2022-11-12 | Discharge: 2022-11-12 | Payer: MEDICARE

## 2022-11-12 DIAGNOSIS — K760 Fatty (change of) liver, not elsewhere classified: Secondary | ICD-10-CM

## 2022-11-12 DIAGNOSIS — I493 Ventricular premature depolarization: Secondary | ICD-10-CM

## 2022-11-12 DIAGNOSIS — T148XXA Other injury of unspecified body region, initial encounter: Secondary | ICD-10-CM

## 2022-11-12 DIAGNOSIS — M5136 Other intervertebral disc degeneration, lumbar region: Secondary | ICD-10-CM

## 2022-11-12 DIAGNOSIS — I10 Essential (primary) hypertension: Secondary | ICD-10-CM

## 2022-11-12 DIAGNOSIS — R011 Cardiac murmur, unspecified: Secondary | ICD-10-CM

## 2022-11-12 DIAGNOSIS — M5416 Radiculopathy, lumbar region: Secondary | ICD-10-CM

## 2022-11-12 DIAGNOSIS — C801 Malignant (primary) neoplasm, unspecified: Secondary | ICD-10-CM

## 2022-11-12 DIAGNOSIS — M1990 Unspecified osteoarthritis, unspecified site: Secondary | ICD-10-CM

## 2022-11-12 DIAGNOSIS — N529 Male erectile dysfunction, unspecified: Secondary | ICD-10-CM

## 2022-11-12 DIAGNOSIS — M2550 Pain in unspecified joint: Secondary | ICD-10-CM

## 2022-11-12 DIAGNOSIS — Z9229 Personal history of other drug therapy: Secondary | ICD-10-CM

## 2022-11-12 DIAGNOSIS — G971 Other reaction to spinal and lumbar puncture: Secondary | ICD-10-CM

## 2022-11-12 DIAGNOSIS — I729 Aneurysm of unspecified site: Secondary | ICD-10-CM

## 2022-11-12 DIAGNOSIS — D689 Coagulation defect, unspecified: Secondary | ICD-10-CM

## 2022-11-12 DIAGNOSIS — H269 Unspecified cataract: Secondary | ICD-10-CM

## 2022-11-12 DIAGNOSIS — T50905A Adverse effect of unspecified drugs, medicaments and biological substances, initial encounter: Secondary | ICD-10-CM

## 2022-11-12 DIAGNOSIS — I7121 Ascending aortic aneurysm (HCC): Secondary | ICD-10-CM

## 2022-11-12 DIAGNOSIS — I2699 Other pulmonary embolism without acute cor pulmonale: Secondary | ICD-10-CM

## 2022-11-12 DIAGNOSIS — G609 Hereditary and idiopathic neuropathy, unspecified: Secondary | ICD-10-CM

## 2022-11-12 DIAGNOSIS — E669 Obesity, unspecified: Secondary | ICD-10-CM

## 2022-11-12 DIAGNOSIS — R768 Other specified abnormal immunological findings in serum: Secondary | ICD-10-CM

## 2022-11-12 DIAGNOSIS — E785 Hyperlipidemia, unspecified: Secondary | ICD-10-CM

## 2022-11-12 DIAGNOSIS — G5602 Carpal tunnel syndrome, left upper limb: Secondary | ICD-10-CM

## 2022-11-12 DIAGNOSIS — I2510 Atherosclerotic heart disease of native coronary artery without angina pectoris: Secondary | ICD-10-CM

## 2022-11-12 DIAGNOSIS — I428 Other cardiomyopathies: Secondary | ICD-10-CM

## 2022-11-12 DIAGNOSIS — M810 Age-related osteoporosis without current pathological fracture: Secondary | ICD-10-CM

## 2022-11-12 DIAGNOSIS — M4800 Spinal stenosis, site unspecified: Secondary | ICD-10-CM

## 2022-11-12 DIAGNOSIS — I351 Nonrheumatic aortic (valve) insufficiency: Secondary | ICD-10-CM

## 2022-11-12 DIAGNOSIS — E781 Pure hyperglyceridemia: Secondary | ICD-10-CM

## 2022-11-12 DIAGNOSIS — J189 Pneumonia, unspecified organism: Secondary | ICD-10-CM

## 2022-11-12 DIAGNOSIS — E119 Type 2 diabetes mellitus without complications: Secondary | ICD-10-CM

## 2022-11-14 ENCOUNTER — Encounter: Admit: 2022-11-14 | Discharge: 2022-11-14 | Payer: MEDICARE

## 2022-11-14 ENCOUNTER — Ambulatory Visit: Admit: 2022-11-14 | Discharge: 2022-11-14 | Payer: MEDICARE

## 2022-11-14 DIAGNOSIS — D649 Anemia, unspecified: Secondary | ICD-10-CM

## 2022-11-14 DIAGNOSIS — R899 Unspecified abnormal finding in specimens from other organs, systems and tissues: Secondary | ICD-10-CM

## 2022-11-14 DIAGNOSIS — C61 Malignant neoplasm of prostate: Secondary | ICD-10-CM

## 2022-11-14 LAB — CBC AND DIFF
HEMATOCRIT: 40 % (ref 40–50)
HEMOGLOBIN: 13 g/dL — ABNORMAL LOW (ref 13.5–16.5)
LYMPHOCYTES %: 26 % (ref 24–44)
MCH: 29 pg (ref 26–34)
MCHC: 33 g/dL (ref 32.0–36.0)
MCV: 90 FL (ref 80–100)
MONOCYTES %: 8 % (ref 4–12)
MPV: 9 FL (ref 7–11)
NEUTROPHILS %: 61 % (ref 41–77)
PLATELET COUNT: 131 10*3/uL — ABNORMAL LOW (ref 150–400)
RBC COUNT: 4.4 M/UL (ref 4.4–5.5)
RDW: 15 % — ABNORMAL HIGH (ref 11–15)
WBC COUNT: 4.1 10*3/uL — ABNORMAL LOW (ref 4.5–11.0)

## 2022-11-14 LAB — IRON + BINDING CAPACITY + %SAT+ FERRITIN
IRON BINDING: 338 ug/dL (ref 270–380)
IRON: 79 ug/dL (ref 50–185)

## 2022-11-14 LAB — COMPREHENSIVE METABOLIC PANEL
ALBUMIN: 3.8 g/dL (ref 3.5–5.0)
ALK PHOSPHATASE: 31 U/L (ref 25–110)
ALT: 20 U/L (ref 7–56)
ANION GAP: 9 (ref 3–12)
AST: 19 U/L (ref 7–40)
CALCIUM: 9.5 mg/dL (ref 8.5–10.6)
CO2: 26 MMOL/L (ref 21–30)
CREATININE: 0.6 mg/dL (ref 0.4–1.24)
EGFR: >60 mL/min (ref >60)
GLUCOSE,PANEL: 172 mg/dL — ABNORMAL HIGH (ref 70–100)
POTASSIUM: 4.7 MMOL/L (ref 3.5–5.1)
SODIUM: 139 MMOL/L (ref 137–147)
TOTAL BILIRUBIN: 0.8 mg/dL (ref 0.2–1.3)
TOTAL PROTEIN: 6 g/dL (ref 6.0–8.0)

## 2022-11-14 LAB — TESTOSTERONE,TOTAL: TESTOSTERONE, TOTAL: <10 ng/dL — ABNORMAL LOW (ref 270–1070)

## 2022-11-14 LAB — PROSTATIC SPECIFIC ANTIGEN-PSA

## 2022-11-15 NOTE — Progress Notes
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  He is followed by an outside Urologist and I think has an appointment next week.  Let's make sure he can get those numbers to that clinic.  Otherwise his labs overall look good.

## 2022-11-18 ENCOUNTER — Encounter: Admit: 2022-11-18 | Discharge: 2022-11-18 | Payer: MEDICARE

## 2022-11-19 ENCOUNTER — Ambulatory Visit: Admit: 2022-11-19 | Discharge: 2022-11-20 | Payer: MEDICARE

## 2022-11-19 ENCOUNTER — Encounter: Admit: 2022-11-19 | Discharge: 2022-11-19 | Payer: MEDICARE

## 2022-11-19 DIAGNOSIS — M5417 Radiculopathy, lumbosacral region: Secondary | ICD-10-CM

## 2022-11-19 NOTE — Progress Notes
[facility] NOTE  Subjective     SUBJECTIVE:   Follow up after SCS explant     Manufacturer: [employer]  Post-op day: 10    Surgery Date: 11/09/2022  Side effects/complications: none  Patient comments:     Patient reports mild incisional pain, yet well managed with postop opioid therapy. He states he has been using Tramadol 1-2 tabs daily PRN. He has been also been taking Mobic daily which has provided significant relief, he is requesting refill today.    Denies fevers, chills, or incisional drainage or swelling.    Denies changes in BLE strength.  Denies loss of bowel/bladder function.               Review of Systems    Current Outpatient Medications:     alendronate (FOSAMAX) 70 mg tablet, Take one tablet by mouth every 7 days., Disp: , Rfl:     ezetimibe (ZETIA) 10 mg tablet, Take one tablet by mouth daily., Disp: 90 tablet, Rfl: 3    furosemide (LASIX) 20 mg tablet, Take one tablet by mouth daily as needed., Disp: 90 tablet, Rfl: 1    leuprolide 3 month (LUPRON DEPOT (3 MONTH)) 11.25 mg/1.5 mL injection, , Disp: , Rfl:     lisinopriL (ZESTRIL) 10 mg tablet, Take one tablet by mouth daily., Disp: 90 tablet, Rfl: 3    [START ON 02/01/2023] metFORMIN (GLUCOPHAGE) 1,000 mg tablet, Take one tablet by mouth twice daily with meals., Disp: 180 tablet, Rfl: 3    metoprolol succinate XL (TOPROL XL) 50 mg extended release tablet, Take one tablet by mouth daily. Take 25 mg daily for one week then 50 mg daily, Disp: 90 tablet, Rfl: 3    omega-3 fatty acids/fish oil (FISH OIL /OMEGA-3 FATTY ACIDS) 1000 mg/3.125 mL soln, Take 3.125 mL by mouth., Disp: , Rfl:     POTASSIUM CHLORIDE PO, Take 90 mg by mouth twice daily., Disp: , Rfl:     pregabalin (LYRICA) 200 mg capsule, Take one capsule by mouth twice daily., Disp: 180 capsule, Rfl: 3    rivaroxaban (XARELTO) 20 mg tablet, Take one tablet by mouth daily with dinner., Disp: 90 tablet, Rfl: 0    rosuvastatin (CRESTOR) 20 mg tablet, Take one tablet by mouth daily., Disp: 90 tablet, Rfl: 1    semaglutide (OZEMPIC) 1 mg/dose (4 mg/3 mL) injection PEN, Inject one mg under the skin every 7 days., Disp: 9 mL, Rfl: 0    spironolactone (ALDACTONE) 25 mg tablet, Take one tablet by mouth daily., Disp: 90 tablet, Rfl: 1    tamsulosin (FLOMAX) 0.4 mg capsule, , Disp: , Rfl:     tiZANidine (ZANAFLEX) 2 mg tablet, Take two tablets by mouth three times daily., Disp: 60 tablet, Rfl: 0    traMADoL (ULTRAM) 50 mg tablet, Take one tablet by mouth every 8 hours as needed., Disp: 90 tablet, Rfl: 2  Allergies   Allergen Reactions    Atorvastatin FLUSHING (SKIN), SEE COMMENTS and HIVES     rash      Niacin FLUSHING (SKIN) and SEE COMMENTS     Other reaction(s): Red flush, itching  Other reaction(s): itching/redness    Other [Unclassified Drug] BLISTERS     dissolving stiches in mouth after dental surgery caused blisters    Rubber SEE COMMENTS     Neoprene rubber causes contact dermatitis (CAUSED BY THIOUREA IN NEOPRENE)       Physical Exam  General: Alert, oriented, no acute distress  Resp:  Non labored breathing, no distress  Skin: incisions healing well, well approximated, no sx of infection, drainage, erythema or swelling noted.        Vitals:    11/19/22 1357   BP: 98/65   Pulse: 71   SpO2: 98%   PainSc: Three   Weight: 114.8 kg (253 lb)   Height: 177.8 cm (5' 10)     Oswestry Total Score:: 48  Pain Score: Three  Body mass index is 36.3 kg/m?Marland Kitchen           IMPRESSION:  1. Lumbosacral radiculopathy    2. Failure of spinal cord stimulator, sequela          PLAN:   Dressing change, suture (s) removed without difficulty  Mastisol, steri-strips, gauze and tegaderm applied over each incision.   Pt instructed to keep incision dry, may begin showers in 2 days, but no bathing or submerging for at least 3 weeks  Stim representative present to activate and program stimulator, education provided   Encouraged to continue activity restriction to no heavy lifting greater than 10lbs, limit twisting and excessive bending of the spine for 4-6 weeks.   Follow up in 3 weeks for wound check and stim eval.    Patient will return home to confirm Mobic dosage, will provide refill accordingly.

## 2022-11-20 ENCOUNTER — Encounter: Admit: 2022-11-20 | Discharge: 2022-11-20 | Payer: MEDICARE

## 2022-11-20 DIAGNOSIS — T85192S Other mechanical complication of implanted electronic neurostimulator (electrode) of spinal cord, sequela: Secondary | ICD-10-CM

## 2022-11-20 MED ORDER — TRAMADOL 50 MG PO TAB
50 mg | ORAL_TABLET | ORAL | 2 refills | Status: AC | PRN
Start: 2022-11-20 — End: ?

## 2022-11-20 NOTE — Telephone Encounter
Patient requesting refill of tramadol  Last Office Visit 11/19/22  Next Office Visit 12/31/22  Required labs NA  [facility] last filled 12/22/21 for 3 month supply

## 2022-11-21 MED ORDER — MELOXICAM 15 MG PO TAB
7.5-15 mg | ORAL_TABLET | Freq: Every day | ORAL | 3 refills | 30.00000 days | Status: DC | PRN
Start: 2022-11-21 — End: 2022-11-21

## 2022-11-26 ENCOUNTER — Ambulatory Visit: Admit: 2022-11-26 | Discharge: 2022-11-26 | Payer: MEDICARE

## 2022-11-26 ENCOUNTER — Ambulatory Visit: Admit: 2022-11-26 | Discharge: 2022-11-27 | Payer: MEDICARE

## 2022-11-26 ENCOUNTER — Encounter: Admit: 2022-11-26 | Discharge: 2022-11-26 | Payer: MEDICARE

## 2022-11-26 VITALS — BP 96/68 | HR 52 | Temp 98.20000°F | Resp 14 | Ht 70.0 in | Wt 252.0 lb

## 2022-11-26 DIAGNOSIS — G971 Other reaction to spinal and lumbar puncture: Secondary | ICD-10-CM

## 2022-11-26 DIAGNOSIS — M2550 Pain in unspecified joint: Secondary | ICD-10-CM

## 2022-11-26 DIAGNOSIS — I428 Other cardiomyopathies: Secondary | ICD-10-CM

## 2022-11-26 DIAGNOSIS — T148XXA Other injury of unspecified body region, initial encounter: Secondary | ICD-10-CM

## 2022-11-26 DIAGNOSIS — I351 Nonrheumatic aortic (valve) insufficiency: Secondary | ICD-10-CM

## 2022-11-26 DIAGNOSIS — I729 Aneurysm of unspecified site: Secondary | ICD-10-CM

## 2022-11-26 DIAGNOSIS — J849 Interstitial pulmonary disease, unspecified: Principal | ICD-10-CM

## 2022-11-26 DIAGNOSIS — J984 Other disorders of lung: Secondary | ICD-10-CM

## 2022-11-26 DIAGNOSIS — E669 Obesity, unspecified: Secondary | ICD-10-CM

## 2022-11-26 DIAGNOSIS — E781 Pure hyperglyceridemia: Secondary | ICD-10-CM

## 2022-11-26 DIAGNOSIS — I10 Essential (primary) hypertension: Secondary | ICD-10-CM

## 2022-11-26 DIAGNOSIS — R768 Other specified abnormal immunological findings in serum: Secondary | ICD-10-CM

## 2022-11-26 DIAGNOSIS — D689 Coagulation defect, unspecified: Secondary | ICD-10-CM

## 2022-11-26 DIAGNOSIS — M1990 Unspecified osteoarthritis, unspecified site: Secondary | ICD-10-CM

## 2022-11-26 DIAGNOSIS — N529 Male erectile dysfunction, unspecified: Secondary | ICD-10-CM

## 2022-11-26 DIAGNOSIS — H269 Unspecified cataract: Secondary | ICD-10-CM

## 2022-11-26 DIAGNOSIS — M5416 Radiculopathy, lumbar region: Secondary | ICD-10-CM

## 2022-11-26 DIAGNOSIS — G5602 Carpal tunnel syndrome, left upper limb: Secondary | ICD-10-CM

## 2022-11-26 DIAGNOSIS — T50905A Adverse effect of unspecified drugs, medicaments and biological substances, initial encounter: Secondary | ICD-10-CM

## 2022-11-26 DIAGNOSIS — I7121 Ascending aortic aneurysm (HCC): Secondary | ICD-10-CM

## 2022-11-26 DIAGNOSIS — G609 Hereditary and idiopathic neuropathy, unspecified: Secondary | ICD-10-CM

## 2022-11-26 DIAGNOSIS — E785 Hyperlipidemia, unspecified: Secondary | ICD-10-CM

## 2022-11-26 DIAGNOSIS — K760 Fatty (change of) liver, not elsewhere classified: Secondary | ICD-10-CM

## 2022-11-26 DIAGNOSIS — Z9229 Personal history of other drug therapy: Secondary | ICD-10-CM

## 2022-11-26 DIAGNOSIS — I493 Ventricular premature depolarization: Secondary | ICD-10-CM

## 2022-11-26 DIAGNOSIS — C801 Malignant (primary) neoplasm, unspecified: Secondary | ICD-10-CM

## 2022-11-26 DIAGNOSIS — M810 Age-related osteoporosis without current pathological fracture: Secondary | ICD-10-CM

## 2022-11-26 DIAGNOSIS — I2510 Atherosclerotic heart disease of native coronary artery without angina pectoris: Secondary | ICD-10-CM

## 2022-11-26 DIAGNOSIS — E119 Type 2 diabetes mellitus without complications: Secondary | ICD-10-CM

## 2022-11-26 DIAGNOSIS — I2699 Other pulmonary embolism without acute cor pulmonale: Secondary | ICD-10-CM

## 2022-11-26 DIAGNOSIS — M4800 Spinal stenosis, site unspecified: Secondary | ICD-10-CM

## 2022-11-26 DIAGNOSIS — M5136 Other intervertebral disc degeneration, lumbar region: Secondary | ICD-10-CM

## 2022-11-26 DIAGNOSIS — J189 Pneumonia, unspecified organism: Secondary | ICD-10-CM

## 2022-11-26 DIAGNOSIS — R011 Cardiac murmur, unspecified: Secondary | ICD-10-CM

## 2022-11-26 NOTE — Progress Notes
Date of Service: 11/26/2022    Subjective:             Victor Graham is a 72 y.o. male.    This is a 72 y.o. year old male who presents to the [facility] for further evaluation.    To summarize his history,     Victor Graham was referred to our clinic after he had abnormal imaging findings on a CT scan.  Overall, he notes that he does not have any significant respiratory Graham.  He denies any notable shortness of breath.  He has a rare cough. He is not on any supplemental oxygen.    He was subsequently referred to the [facility] for further evaluation.    I screened him for autoimmune Graham that might suggest an autoimmune featured interstitial lung disease.  He reports arthralgias, but denies any synovitis.  He reports significant dry mouth Graham but denies other sicca Graham.  He denies GERD Graham, but reports occasional food sticking to suggest esophageal dysphagia.  He denies raynaud's phenomenon, sclerodactyly, or hyperkeratosis.  He denies any muscle weakness or tenderness. He denies any significant skin rashes or ulcerations.     I also screened him for environmental exposures that might suggest a chronic hypersensitivity pneumonitis.  He denies having birds in the home.  Denies the use of feather pillows or a down comforter.  He does reports that he participates in woodworking as a hobby and has routine exposure polyurethane or isocyanate, but states he wears a respirator when using chemicals or is around dust.  He does report a history of water damage in the home.  He had this remediatied and mold growth is not obvious.  He denies the use of a humidifier or hot tub.  He doesn't live near any industrial or agricultural facilities.  There are no obvious exposures to any other significant respiratory toxins.    Potential drug exposures were also reviewed to exclude a drug induced cause.  He denies the use of chronic nitrofurantoin, methotrexate or amiodarone.  He denies radiation exposure.     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  His prior ANA was markedly elevated.  He denies being diagnosed with an autoimmune disease.     _____________________________________________________________    Victor Graham. He has not noticed any change over the past year. He has been diagnosed with prostate cancer and received radiation. He also was treated with a therapy that led to diarrhea, dehydration and hypokalemia requiring a multi day hospitalization.  Otherwise, his biggest complaint is his back.  We reviewed his CT and PFTs, which are questionable if there has been any progression.  TLC remains unchanged, FVC has gone up and down and only his DLCO is lower than prior values.      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    hex lupus anticoagulant    CAD (coronary artery disease)     Carpal tunnel syndrome of left wrist     emg by  Dr Lynnae Sandhoff at [facility] shows severe median neuropathy    Cataract     Cateract Surgery Dec 2022    Degenerative disc disease, lumbar     Diabetes (HCC)     Dyslipidemia     Erectile dysfunction     Fatty liver     Heart murmur Nov 2020    HLD (hyperlipidemia) 01/08/2008    HX: anticoagulation     Hypertension 01/08/2008    Hypertriglyceridemia     Idiopathic polyneuropathy     Joint pain Jul 2008    Lumbar radiculopathy     Nerve injury     NICM (nonischemic cardiomyopathy) (HCC)     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 36 pack years. He denies significant alcohol or illicit substances.    FAMILY HISTORY:  Pulmonary fibrosis or autoimmune diseases does not run in the family       Review of Systems  A full 14 point review of systems was performed and is as above or is unremarkable.      Objective:          alendronate (FOSAMAX) 70 mg tablet Take one tablet by mouth every 7 days.    ezetimibe (ZETIA) 10 mg tablet Take one tablet by mouth daily.    furosemide (LASIX) 20 mg tablet Take one tablet by mouth daily as needed.    leuprolide 3 month (LUPRON DEPOT (3 MONTH)) 11.25 mg/1.5 mL injection     lisinopriL (ZESTRIL) 10 mg tablet Take one tablet by mouth daily.    [START ON 02/01/2023] metFORMIN (GLUCOPHAGE) 1,000 mg tablet Take one tablet by mouth twice daily with meals.    metoprolol succinate XL (TOPROL XL) 50 mg extended release tablet Take one tablet by mouth daily. Take 25 mg daily for one week then 50 mg daily    omega-3 fatty acids/fish oil (FISH OIL /OMEGA-3 FATTY ACIDS) 1000 mg/3.125 mL soln Take 3.125 mL by mouth.    POTASSIUM CHLORIDE PO Take 90 mg by mouth twice daily.    pregabalin (LYRICA) 200 mg capsule Take one capsule by mouth twice daily.    rivaroxaban (XARELTO) 20 mg tablet Take one tablet by mouth daily with dinner.    rosuvastatin (CRESTOR) 20 mg tablet Take one tablet by mouth daily.    semaglutide (OZEMPIC) 1 mg/dose (4 mg/3 mL) injection PEN Inject one mg under the skin every 7 days.    spironolactone (ALDACTONE) 25 mg tablet Take one tablet by mouth daily.    tamsulosin (FLOMAX) 0.4 mg capsule     tiZANidine (ZANAFLEX) 2 mg tablet Take two tablets by mouth three times daily.    traMADoL (ULTRAM) 50 mg tablet Take one tablet by mouth every 8 hours as needed.     Vitals:    11/26/22 1231   BP: 96/68   BP Source: Arm, Left Upper   Pulse: 52   Temp: 36.8 ?C (98.2 ?F)  Resp: 14   SpO2: 97%   TempSrc: Oral   Weight: 114.3 kg (252 lb)   Height: 177.8 cm (5' 10)     Body mass index is 36.16 kg/m?Marland Kitchen     Physical Exam   GENERAL:  Alert and Pleasant, No Distress  HEENT: PERRL, EOMI  NECK: No Cervical or Supraclavicular Adenopathy, No Thyromegaly  CVS:  Regular Rate and Rhythm  LUNGS: Bibasilar crackles, No Wheezes  ABDOMEN: Soft, Nontender, No Hepatosplenomegaly  EXTREMITIES: No edema.  SKIN: No rashes. No ulcers.  NEURO: Grossly normal    REVIEW OF DATA:    Pulmonary Function Tests:   Complete PFT Absolute FVC-Pre FEV1-Pre RVPleth-Pre TLCPleth-Pre DLCOunc-%Pred-Pre   Latest Ref Rng & Units L L L L %   01/06/2019   1:44 PM 3.04  2.53  2.07  5.17  85    07/07/2019  12:35 PM 3.12  2.64  1.87  5.15  104    03/01/2020  11:37 AM 2.96  2.50  1.97  4.91  86    08/23/2020   1:52 PM 2.66  2.20  2.01  4.83  92    01/17/2021   9:41 AM 2.90  2.46  2.03  5.12  92    07/18/2021  11:47 AM 3.01  2.52  2.03  5.02  103    11/26/2022  10:45 AM 2.84  2.45  2.15  5.14  84      Complete PFT Percent Predicted FVC-%Pred-pre FEV1-%Pred-Pre RVPleth-%Pred-Pre TLCPleth-%Pred-Pre DLCOunc-%Pred-Pre   Latest Ref Rng & Units % % % % %   01/06/2019   1:44 PM 75  83  89  78  85    07/07/2019  12:35 PM 78  86  80  77  104    03/01/2020  11:37 AM 73  81  84  74  86    08/23/2020   1:52 PM 67  72  85  73  92    01/17/2021   9:41 AM 73  81  86  77  92    07/18/2021  11:47 AM 76  84  85  75  103    11/26/2022  10:45 AM 77  88  89  77  84             Chest Imaging:  Chest CT:    IMPRESSION     1. Healed medial sternotomy with supracoronary ascending aortic repair   extending to zone 0.     2. Sinuses of Valsalva measure up to 4.0 cm measured orthogonal to the   long axis from cusp to cusp or cusp to commissure.     3. Peripheral and basilar predominant reticulation with mild volume loss   and architectural distortion suggestive of fibrosis which may be slightly   progressed from 2022 although in part could be related to differences in   technique. Consider correlation with Graham and pulmonary function   testing.     Cardiac Data:  Echocardiogram:  Interpretation Summary     Rest Echo:   Overall left ventricular systolic function is normal. EF~ 55%  Abnormal septal motion  Valves appear structurally normal without signficant stenosis or regurgitation  No significant pericardial effusion  Normal ventricular chamber dimensions  Mild left and right atrial enlargement  Normal LV wall thickness  Normal diastolic function  Mild aortic root dilation  Estimated peak systolic PA pressure =  25 mmHg         Pathology Data:  None  Lab Data:  Labs:  Results for Victor, Graham (MRN [id number]) as of 01/09/2019 10:10   Ref. Range 09/01/2015 07:52 03/30/2016 16:26 06/25/2016 09:34 03/20/2017 15:40 09/11/2017 14:51 01/06/2019 15:47   Alternaria Alternata Unknown      <2.0.Marland KitchenMarland Kitchen   Aspergillus Fumigatus IgG Unknown      16.7   Aureobasidium Pullulans Unknown      3.7   Micropolyspora Faeni Unknown      <2.0.Marland KitchenMarland Kitchen   Penicillium Notatum Unknown      13.9   Phoma Herbarum Latest Units: MCG/ML      2.6   Thermoactomyces Vulgaris Unknown      5.8   Trichoderma Viride Unknown      5.3   DNA Double Strand AB Latest Ref Range: <10 TITER    <10     ANA Screen Latest Ref Range: <80 TITER SEE TITER     SEE TITER   ANA Titer/Pattern Latest Ref Range: <80  > OR = 1280     320 (H)   Anti-Smith Latest Ref Range: <1.0 AI     <0.2    Anti-RNP Latest Ref Range: <1.0 AI     <0.2    Anti-SSA Latest Ref Range: <1.0 AI     <0.2 <0.2   Anti-SSB Latest Ref Range: <1.0 AI     <0.2 <0.2   Rheum Factor Screen Latest Ref Range: <25 IU/mL <20 <20    <10   Centromere Antibody Unknown  <0.2.Marland KitchenMarland Kitchen       Myeloperoxidase AB Latest Ref Range: <1.0 AI      <0.2   Serine Protease3 AB Latest Ref Range: <1.0 AI      <0.2   Complemnt C3 Latest Ref Range: 88 - 200 MG/DL    478.2 Complemnt C4 Latest Ref Range: 10 - 49 MG/DL    95.6     Jo 1 Antibody Latest Ref Range: <1.0 AI      <0.2   SCL70 Ab Latest Ref Range: <1.0 AI  <0.2...    <0.2   BETA-2 GLY 1 AB ICG Latest Ref Range: 0 - 20 SGU  1.7       BETA-2 GLY 1 AB IGM Latest Ref Range: 0 - 20 SMU  7.2       CCP IgG Antibody Latest Ref Range: <3.0 U/mL  <0.5    <0.5   Cardiolipin, IgG Latest Ref Range: <20.0 GPL/ML  1.2 <1.6      Cardiolipin, IgM Latest Ref Range: <20.0 MPL/ML  21.8 (H) 12.1      Dilute Russell Viper Venom Latest Ref Range: <1.3 RATIO  1.2       Hexagonal Lupus Anticoagulant Unknown  14 (H) 16 (H)      IgG Latest Ref Range: 762 - 1,488 MG/DL      213   IgM Latest Ref Range: 38 - 328 MG/DL      086   IgA Latest Ref Range: 70 - 390 MG/DL      578                Assessment and Plan:  This is a 72 y.o. 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    PROBLEM LIST:  1.  Interstitial Lung Disease  2.  Interstitial Lung Abnormalities - UIP/IPF vs non-progressive finding found with age.  3.  Positive ANA  4.  History of VTE  5.  Mild  restrictive lung disease    PLAN:  1.  We discussed initiation of early anti-fibrotic vs monitoring.  He would prefer to continue to monitor..  2.  Yearly PFTs and CT  3.  Initiate ofev or esbriet if clear progression    Morna Flud S. Margo Aye, MD  Associate Professor  Associate Director of the [facility]  Division of Pulmonary & Critical Care Medicine  [address]  [address]  [place], [place] [address]  Phone: [phone]  Fax: [phone]    Total Time Today was 30 minutes in the following activities: Preparing to see the patient, Obtaining and/or reviewing separately obtained history, Performing a medically appropriate examination and/or evaluation, Counseling and educating the patient/family/caregiver, Ordering medications, tests, or procedures, Referring and communication with other health care professionals (when not separately reported), Documenting clinical information in the electronic or other health record, Independently interpreting results (not separately reported) and communicating results to the patient/family/caregiver, and Care coordination (not separately reported)

## 2022-11-26 NOTE — Patient Instructions
Clinic Visit Summary:     Next clinic visit follow up with Dr Nevada Crane recommended in 12 months with Pulmonary Function Tests and Chest CT, at any location.     Please contact Pulmonary Nurse Coordinator with signs and symptoms of worsening productive cough with thick secretions, blood in sputum, chest tightness/pain, shortness of breath, fever, chills, night sweats, or any questions or concerns.     ILD/Sarcoidosis Clinical Care Coordinators: Ola Spurr, RN [phone], Aram Candela, RN [phone], and Jeananne Rama, RN [phone].    For refills on medications, please have your pharmacy fax a refill authorization request form to our office at Fax) [phone]. Please allow at least 3 business days for refill requests.   For urgent issues after business hours/weekends/holidays call [phone] and request for the pulmonary fellow to be paged. For scheduling questions/concerns, please call [phone].

## 2022-12-11 ENCOUNTER — Encounter: Admit: 2022-12-11 | Discharge: 2022-12-11 | Payer: MEDICARE

## 2022-12-11 DIAGNOSIS — M48062 Spinal stenosis, lumbar region with neurogenic claudication: Secondary | ICD-10-CM

## 2022-12-12 ENCOUNTER — Encounter: Admit: 2022-12-12 | Discharge: 2022-12-12 | Payer: MEDICARE

## 2022-12-19 ENCOUNTER — Encounter: Admit: 2022-12-19 | Discharge: 2022-12-19 | Payer: MEDICARE

## 2022-12-19 DIAGNOSIS — I2510 Atherosclerotic heart disease of native coronary artery without angina pectoris: Secondary | ICD-10-CM

## 2022-12-19 DIAGNOSIS — M4800 Spinal stenosis, site unspecified: Secondary | ICD-10-CM

## 2022-12-19 DIAGNOSIS — I2699 Other pulmonary embolism without acute cor pulmonale: Secondary | ICD-10-CM

## 2022-12-19 DIAGNOSIS — K760 Fatty (change of) liver, not elsewhere classified: Secondary | ICD-10-CM

## 2022-12-19 DIAGNOSIS — G971 Other reaction to spinal and lumbar puncture: Secondary | ICD-10-CM

## 2022-12-19 DIAGNOSIS — I729 Aneurysm of unspecified site: Secondary | ICD-10-CM

## 2022-12-19 DIAGNOSIS — M1990 Unspecified osteoarthritis, unspecified site: Secondary | ICD-10-CM

## 2022-12-19 DIAGNOSIS — E669 Obesity, unspecified: Secondary | ICD-10-CM

## 2022-12-19 DIAGNOSIS — E785 Hyperlipidemia, unspecified: Secondary | ICD-10-CM

## 2022-12-19 DIAGNOSIS — N529 Male erectile dysfunction, unspecified: Secondary | ICD-10-CM

## 2022-12-19 DIAGNOSIS — R011 Cardiac murmur, unspecified: Secondary | ICD-10-CM

## 2022-12-19 DIAGNOSIS — I493 Ventricular premature depolarization: Secondary | ICD-10-CM

## 2022-12-19 DIAGNOSIS — I351 Nonrheumatic aortic (valve) insufficiency: Secondary | ICD-10-CM

## 2022-12-19 DIAGNOSIS — E119 Type 2 diabetes mellitus without complications: Secondary | ICD-10-CM

## 2022-12-19 DIAGNOSIS — Z9229 Personal history of other drug therapy: Secondary | ICD-10-CM

## 2022-12-19 DIAGNOSIS — M5416 Radiculopathy, lumbar region: Secondary | ICD-10-CM

## 2022-12-19 DIAGNOSIS — I428 Other cardiomyopathies: Secondary | ICD-10-CM

## 2022-12-19 DIAGNOSIS — T148XXA Other injury of unspecified body region, initial encounter: Secondary | ICD-10-CM

## 2022-12-19 DIAGNOSIS — E781 Pure hyperglyceridemia: Secondary | ICD-10-CM

## 2022-12-19 DIAGNOSIS — R768 Other specified abnormal immunological findings in serum: Secondary | ICD-10-CM

## 2022-12-19 DIAGNOSIS — D689 Coagulation defect, unspecified: Secondary | ICD-10-CM

## 2022-12-19 DIAGNOSIS — M810 Age-related osteoporosis without current pathological fracture: Secondary | ICD-10-CM

## 2022-12-19 DIAGNOSIS — H269 Unspecified cataract: Secondary | ICD-10-CM

## 2022-12-19 DIAGNOSIS — T50905A Adverse effect of unspecified drugs, medicaments and biological substances, initial encounter: Secondary | ICD-10-CM

## 2022-12-19 DIAGNOSIS — G5602 Carpal tunnel syndrome, left upper limb: Secondary | ICD-10-CM

## 2022-12-19 DIAGNOSIS — I10 Essential (primary) hypertension: Secondary | ICD-10-CM

## 2022-12-19 DIAGNOSIS — M2550 Pain in unspecified joint: Secondary | ICD-10-CM

## 2022-12-19 DIAGNOSIS — M5136 Other intervertebral disc degeneration, lumbar region: Secondary | ICD-10-CM

## 2022-12-19 DIAGNOSIS — I7121 Ascending aortic aneurysm (HCC): Secondary | ICD-10-CM

## 2022-12-19 DIAGNOSIS — G609 Hereditary and idiopathic neuropathy, unspecified: Secondary | ICD-10-CM

## 2022-12-19 DIAGNOSIS — J189 Pneumonia, unspecified organism: Secondary | ICD-10-CM

## 2022-12-19 DIAGNOSIS — C801 Malignant (primary) neoplasm, unspecified: Secondary | ICD-10-CM

## 2022-12-19 MED ORDER — RIVAROXABAN 20 MG PO TAB
20 mg | ORAL_TABLET | Freq: Every day | ORAL | 3 refills | 30.00000 days | Status: AC
Start: 2022-12-19 — End: ?

## 2022-12-19 MED ORDER — LISINOPRIL 5 MG PO TAB
5 mg | ORAL_TABLET | Freq: Every day | ORAL | 3 refills | Status: AC
Start: 2022-12-19 — End: ?

## 2022-12-19 NOTE — Patient Instructions
Thank you for visiting our office today, we want to provide you with the best possible care.     Dr Iver Nestle would like to see you again in 6months.     Take your medications as prescribed.   Please carefully check your list with what you have on hand at home and notify us of any changes or updates.     Please call or mychart message with any additional questions or concerns.    For questions: please Dr. Rollene Fare nursing line at [phone] Monday - Friday 8-5 only. [facility] [address]/[place])        Please leave a detailed message with your name, date of birth, and reason for your call.  A nurse will return your call as soon as possible    Scheduling: Please call [phone]    After hours: [phone]    For all medication refills: please contact your pharmacy.      It was truly our pleasure seeing you today. Thank you for choosing [facility] for your heart health!

## 2022-12-19 NOTE — Progress Notes
Date of Service: 12/19/2022    Victor Graham is a 72 y.o. male.       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       He is not using an upright walker as it easier on his back.  After the prostate radiation, the urine flow is much improved.      His activity level is limited but he does not feel short of breath and he does not have any anginal-like pain.        Previous remote coronary angiography had shown mild nonobstructive CAD.  The last stress test in 2020 showed a small area of mix of infarct and ischemia in the inferolateral wall. The latest echo is from 2022 showing an EF of 45%.  This is a chronic finding.    The ascending aortic aneurysm repair was many years ago.  The last CT done in 2022-noncontrast study, appearance was similar to the one done in 2020.  The previous 2020 study was reviewed by  Dr. Helen Hashimoto of [facility] personally and he felt that there is nothing to worry about.     His blood pressures have been running low.  He denies any lightheadedness.  There has been no fainting.     Cholesterol check in July 2024 was excellent.     The Xarelto is being taken for his history of PE.      He does not smoke.            Vitals:    12/19/22 1009 12/19/22 1037   BP: (!) 87/54 95/53   BP Source: Arm, Left Upper Arm, Left Upper   Pulse: 78    SpO2: 97%    PainSc: Zero    Weight: 118.7 kg (261 lb 11.2 oz)    Height: 177.8 cm (5' 10)      Body mass index is 37.55 kg/m?Marland Kitchen     Past Medical History  Patient Active Problem List    Diagnosis Date Noted    Lumbar stenosis with neurogenic claudication 09/10/2022    Cauda equina syndrome (HCC) 05/01/2022    Malignant neoplasm of prostate (HCC) 04/06/2022     [facility], Dr Maximino Greenland,      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  (HCC) 10/30/2021    Venous stasis ulcer of other part of lower leg limited to breakdown of skin, unspecified laterality, unspecified whether varicose veins present (HCC) 10/30/2021    Carpal tunnel syndrome of left wrist 07/06/2021     emg by Dr Lynnae Sandhoff [facility] shows severe median neuropathy      Leg swelling 01/12/2021    Nummular eczema 10/24/2020    Type 2 diabetes mellitus with diabetic polyneuropathy, with long-term current use of insulin (HCC) 10/24/2020    History of deep venous thrombosis 03/24/2019    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 Started Rapaflo 8mg   01/26/14 - LUTS much improved. Continue Rapaflo 8mg   02/15/15 - stable LUTS      08/16/15-  Minimal nocturia and no other symptoms. Interested in trialing discontinuation of his Rapaflo. He will call if his symptoms worsen and we can refill his medications over the phone.     08/14/16 - symptoms stable, PVR 64mL       Class 3 severe obesity due to excess calories with serious comorbidity and body mass index (BMI) of 40.0 to 44.9 in adult [facility]) 10/28/2013    Cardiomyopathy, idiopathic (HCC) 06/06/2009     4/11 repeat echo EF 45%, mild AI  2/11 echo from PCPs office EF 35%. New finding compared to prior EF 45-50% in '09 (by echo at [facility]), LV gram from '09 reviewed- EF 45%      Status post thoracic aortic aneurysm repair 07/12/2008     3/10-   30 mm Hemashield graft used to replace the ascending aorta  02/2019- Stable focal dissection flap originating from the superior aspect of   the ascending aortic graft. Has been personally reviewed by Dr Helen Hashimoto and no concerns      Essential hypertension 01/08/2008     09-17-14: Well controlled. Taking Lisinopril 10 mg daily. Coreg 25 mg 0.5 tablet BID  Well controlled      Dyslipidemia 01/08/2008     10-08-14: TC 164, HDL 35, TG 237. On Zocor 20 mg  07-07-14: TC 209, LDL 234, HDL 45, TG 234  11/14 TC 153, LDL 74, HDL 45, TG 172  1/14 good numbers  5/13 TC 152, LDL 76, HDL 39, TG 183  2/11 TC 147, LDL 81, HDL 42, TG 138  Panel checked in 6/10- TC 144, LDL 87, HDL 36, TG 131, on simva 40 qhs, intolerant of niacin      CAD (coronary artery disease) 01/08/2008     Mild to moderate, non-obstructive by angio in 10/09 done following an abnormal stress thallium            Review of Systems   Constitutional: Negative.   HENT: Negative.     Eyes: Negative.    Cardiovascular:  Positive for leg swelling.   Respiratory: Negative.     Endocrine: Negative.    Hematologic/Lymphatic: Negative.    Skin: Negative.    Musculoskeletal:  Positive for joint swelling.   Gastrointestinal: Negative.    Genitourinary: Negative.    Neurological: Negative.    Psychiatric/Behavioral: Negative.     Allergic/Immunologic: Negative.        Physical Exam    General Appearance: no acute distress  Skin: warm, moist, no ulcers  HEENT: unremarkable  Neck Veins: neck veins are flat, neck veins are not distended  Carotid Arteries: normal carotid upstroke bilaterally, no bruits  Chest Inspection: chest is normal in appearance  Auscultation/Percussion: lungs clear   Cardiac Rhythm: regular rhythm and normal rate  Cardiac Auscultation: Normal S1 & S2, no S3 or S4, no rub  Murmurs: soft esm base   Extremities: Only trivial edema   Abdominal Exam: soft, non-tender, no masses, bowel sounds normal  Neurologic Exam: neurological assessment grossly intact      Cardiovascular Health Factors  Vitals BP Readings from Last 3 Encounters:   12/19/22 95/53   11/26/22 96/68   11/19/22 98/65     Wt Readings from Last 3 Encounters:   12/19/22 118.7 kg (261 lb 11.2 oz)   11/26/22 114.3 kg (252 lb)   11/19/22 114.8 kg (253 lb)     BMI Readings from Last 3 Encounters:   12/19/22 37.55 kg/m?   11/26/22 36.16 kg/m? 11/19/22 36.30 kg/m?  Smoking Social History     Tobacco Use   Smoking Status Former    Current packs/day: 0.00    Average packs/day: 2.0 packs/day for 15.0 years (30.0 ttl pk-yrs)    Types: Cigarettes    Start date: 08/23/1976    Quit date: 08/24/1991    Years since quitting: 31.3    Passive exposure: Past   Smokeless Tobacco Never      Lipid Profile Cholesterol   Date Value Ref Range Status   11/01/2022 98 <200 MG/DL Final     HDL   Date Value Ref Range Status   11/01/2022 42 >40 MG/DL Final     LDL   Date Value Ref Range Status   11/01/2022 44 <100 mg/dL Final     Triglycerides   Date Value Ref Range Status   11/01/2022 131 <150 MG/DL Final      Blood Sugar Hemoglobin A1C   Date Value Ref Range Status   10/30/2021 8.5 (H) 4.0 - 5.7 % Final     Comment:     The ADA recommends that most patients with type 1 and type 2 diabetes maintain   an A1c level <7%.       Glucose   Date Value Ref Range Status   11/14/2022 172 (H) 70 - 100 MG/DL Final   78/29/5621 308 (H) 70 - 100 MG/DL Final   65/78/4696 295 (H) 70 - 100 MG/DL Final     Glucose Fasting   Date Value Ref Range Status   09/01/2015 117 (H) 70 - 100 MG/DL Final     Glucose, POC   Date Value Ref Range Status   12/22/2021 164 (A) 70 - 100 Final   12/22/2021 197 (A) 70 - 100 Final   04/18/2017 98 70 - 100 MG/DL Final     Glucose POC   Date Value Ref Range Status   03/18/2019 99 65 - 110 mg/dL Final   28/41/3244 010 (A) 65 - 110 mg/dL Final   27/25/3664 403 65 - 110 mg/dL Final          Problems Addressed Today  Encounter Diagnoses   Name Primary?    Non-ischemic cardiomyopathy (HCC) Yes    Essential hypertension     Dyslipidemia            Assessment and Plan     In summary, Mr. Kari is a 72 year old gentleman with the following cardiac and related issues:      History of mild nonischemic cardiomyopathy, last echo in April 2022, EF of 45%, euvolemic.  He is using furosemide 20 mg daily.           Hypertension, readings are actually on the low side. Lisinopril was cut back to 5 mg daily.     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   Dyslipidemia-  on Crestor with excellent LDL control as of 10/2022.    PVCs-currently asymptomatic.  Continue beta-blockers     History of ascending aortic aneurysm repair in 2010.  ?  Residual dissection reported in 2020.    Unchanged noncontrast CT in 2022.  CT with contrast done in 2024 and results are acceptable.    DM2, followed by Dr. Abel Presto     Mildly abnl stress test,  asymptomatic, on statins, beta-blockers and with no h/o chest discomfort       It was a pleasure to see Nason in the clinic today.  Current Medications (including today's revisions)   alendronate (FOSAMAX) 70 mg tablet Take one tablet by mouth every 7 days.    ezetimibe (ZETIA) 10 mg tablet Take one tablet by mouth daily.    furosemide (LASIX) 20 mg tablet Take one tablet by mouth daily as needed.    leuprolide 3 month (LUPRON DEPOT (3 MONTH)) 11.25 mg/1.5 mL injection every 90 days.    lisinopriL (ZESTRIL) 5 mg tablet Take one tablet by mouth daily.    [START ON 02/01/2023] metFORMIN (GLUCOPHAGE) 1,000 mg tablet Take one tablet by mouth twice daily with meals.    metoprolol succinate XL (TOPROL XL) 50 mg extended release tablet Take one tablet by mouth daily. Take 25 mg daily for one week then 50 mg daily    POTASSIUM CHLORIDE PO Take 90 mg by mouth daily.    pregabalin (LYRICA) 200 mg capsule Take one capsule by mouth twice daily.    rivaroxaban (XARELTO) 20 mg tablet Take one tablet by mouth daily with dinner.    rosuvastatin (CRESTOR) 20 mg tablet Take one tablet by mouth daily.    semaglutide (OZEMPIC) 1 mg/dose (4 mg/3 mL) injection PEN Inject one mg under the skin every 7 days.    spironolactone (ALDACTONE) 25 mg tablet Take one tablet by mouth daily.    tamsulosin (FLOMAX) 0.4 mg capsule Take one capsule by mouth daily.    tiZANidine (ZANAFLEX) 2 mg tablet Take two tablets by mouth three times daily. (Patient taking differently: Take two tablets by mouth three times daily as needed.)    traMADoL (ULTRAM) 50 mg tablet Take one tablet by mouth every 8 hours as needed.

## 2022-12-20 ENCOUNTER — Ambulatory Visit: Admit: 2022-12-19 | Discharge: 2022-12-20 | Payer: MEDICARE

## 2022-12-20 DIAGNOSIS — I10 Essential (primary) hypertension: Secondary | ICD-10-CM

## 2022-12-31 ENCOUNTER — Encounter: Admit: 2022-12-31 | Discharge: 2022-12-31 | Payer: MEDICARE

## 2022-12-31 ENCOUNTER — Ambulatory Visit: Admit: 2022-12-31 | Discharge: 2023-01-01 | Payer: MEDICARE

## 2022-12-31 DIAGNOSIS — M1990 Unspecified osteoarthritis, unspecified site: Secondary | ICD-10-CM

## 2022-12-31 DIAGNOSIS — K760 Fatty (change of) liver, not elsewhere classified: Secondary | ICD-10-CM

## 2022-12-31 DIAGNOSIS — G971 Other reaction to spinal and lumbar puncture: Secondary | ICD-10-CM

## 2022-12-31 DIAGNOSIS — M5416 Radiculopathy, lumbar region: Secondary | ICD-10-CM

## 2022-12-31 DIAGNOSIS — I493 Ventricular premature depolarization: Secondary | ICD-10-CM

## 2022-12-31 DIAGNOSIS — D689 Coagulation defect, unspecified: Secondary | ICD-10-CM

## 2022-12-31 DIAGNOSIS — H269 Unspecified cataract: Secondary | ICD-10-CM

## 2022-12-31 DIAGNOSIS — I729 Aneurysm of unspecified site: Secondary | ICD-10-CM

## 2022-12-31 DIAGNOSIS — N529 Male erectile dysfunction, unspecified: Secondary | ICD-10-CM

## 2022-12-31 DIAGNOSIS — T50905A Adverse effect of unspecified drugs, medicaments and biological substances, initial encounter: Secondary | ICD-10-CM

## 2022-12-31 DIAGNOSIS — M2550 Pain in unspecified joint: Secondary | ICD-10-CM

## 2022-12-31 DIAGNOSIS — E119 Type 2 diabetes mellitus without complications: Secondary | ICD-10-CM

## 2022-12-31 DIAGNOSIS — J189 Pneumonia, unspecified organism: Secondary | ICD-10-CM

## 2022-12-31 DIAGNOSIS — G609 Hereditary and idiopathic neuropathy, unspecified: Secondary | ICD-10-CM

## 2022-12-31 DIAGNOSIS — G5602 Carpal tunnel syndrome, left upper limb: Secondary | ICD-10-CM

## 2022-12-31 DIAGNOSIS — T148XXA Other injury of unspecified body region, initial encounter: Secondary | ICD-10-CM

## 2022-12-31 DIAGNOSIS — Z9229 Personal history of other drug therapy: Secondary | ICD-10-CM

## 2022-12-31 DIAGNOSIS — I2510 Atherosclerotic heart disease of native coronary artery without angina pectoris: Secondary | ICD-10-CM

## 2022-12-31 DIAGNOSIS — I10 Essential (primary) hypertension: Secondary | ICD-10-CM

## 2022-12-31 DIAGNOSIS — R011 Cardiac murmur, unspecified: Secondary | ICD-10-CM

## 2022-12-31 DIAGNOSIS — M810 Age-related osteoporosis without current pathological fracture: Secondary | ICD-10-CM

## 2022-12-31 DIAGNOSIS — M5417 Radiculopathy, lumbosacral region: Secondary | ICD-10-CM

## 2022-12-31 DIAGNOSIS — I428 Other cardiomyopathies: Secondary | ICD-10-CM

## 2022-12-31 DIAGNOSIS — E781 Pure hyperglyceridemia: Secondary | ICD-10-CM

## 2022-12-31 DIAGNOSIS — I2699 Other pulmonary embolism without acute cor pulmonale: Secondary | ICD-10-CM

## 2022-12-31 DIAGNOSIS — E669 Obesity, unspecified: Secondary | ICD-10-CM

## 2022-12-31 DIAGNOSIS — I7121 Ascending aortic aneurysm (HCC): Secondary | ICD-10-CM

## 2022-12-31 DIAGNOSIS — I351 Nonrheumatic aortic (valve) insufficiency: Secondary | ICD-10-CM

## 2022-12-31 DIAGNOSIS — M5136 Other intervertebral disc degeneration, lumbar region: Secondary | ICD-10-CM

## 2022-12-31 DIAGNOSIS — M5137 Other intervertebral disc degeneration, lumbosacral region: Secondary | ICD-10-CM

## 2022-12-31 DIAGNOSIS — Z79899 Other long term (current) drug therapy: Secondary | ICD-10-CM

## 2022-12-31 DIAGNOSIS — C801 Malignant (primary) neoplasm, unspecified: Secondary | ICD-10-CM

## 2022-12-31 DIAGNOSIS — R768 Other specified abnormal immunological findings in serum: Secondary | ICD-10-CM

## 2022-12-31 DIAGNOSIS — M4800 Spinal stenosis, site unspecified: Secondary | ICD-10-CM

## 2022-12-31 DIAGNOSIS — E785 Hyperlipidemia, unspecified: Secondary | ICD-10-CM

## 2022-12-31 MED ORDER — OXYCODONE-ACETAMINOPHEN 5-325 MG PO TAB
1 | ORAL_TABLET | ORAL | 0 refills | 2.00000 days | Status: AC | PRN
Start: 2022-12-31 — End: ?

## 2022-12-31 MED ORDER — TIZANIDINE 2 MG PO TAB
2-4 mg | ORAL_TABLET | Freq: Every evening | ORAL | 2 refills | Status: AC | PRN
Start: 2022-12-31 — End: ?

## 2022-12-31 NOTE — Progress Notes
[facility] Interventional [facility]    Subjective     Chief Complaint: Pain  Chief Complaint   Patient presents with    Lower Back - Pain, Leg Pain     Pain radiate lower to leg     Follow Up       HPI: Victor Graham is a 72 y.o. male who  has a past medical history of Adverse drug reaction, Aneurysm (HCC), Arthritis, Ascending aortic aneurysm (HCC) (01/08/2008), Asymptomatic PVCs (03/11/2015), Blood clotting disorder (HCC) (05 Apr 2017), CAD (coronary artery disease), Carpal tunnel syndrome of left wrist, Cataract, Degenerative disc disease, lumbar, Diabetes (HCC), Dyslipidemia, Erectile dysfunction, Fatty liver, Heart murmur (Nov 2020), HLD (hyperlipidemia) (01/08/2008), anticoagulation, Hypertension (01/08/2008), Hypertriglyceridemia, Idiopathic polyneuropathy, Joint pain (Jul 2008), Lumbar radiculopathy, Nerve injury, NICM (nonischemic cardiomyopathy) (HCC), 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 who presents for evaluation.    LBP  Patient denies any incisional issues since [employer] SCS explant. Denies fever or chills.    He states he has upcoming neurosurgery appointment at [facility] for 2nd opinion.     He states he has been taking Tramadol sparingly but doesn't feel that it has provided much benefit.     He has been using previously prescribed Norco or percocet from previous surgeries, which has provided better relief. Denies itching or somnolence, mild constipation yet BM daily with OTC therapies.     Pain is located across the low back, will radiate to buttock region L>R  Will occasionally radiate in posterolateral distribution to the heel in the LLE  Denies RLE pain  Pain is constant   Rated as 4-9/10 (avg 5/10)  Described as ache, shooting, sharp  Numbness/tingling noted in left buttock/LLE and from knees throughout the feet, constant  Worsened by bending, walking > 20 feet, standing > 5 minutes, getting out of bed  Completely relieved with rest, and partially relieved by heating pad, rest, prescribed medications  + muscle stiffness/tightness/spasms/cramping  +balance issues  +sleep disturbance   + strength loss in bilateral lower extremities, L>R        Hx of spine surgery: yes - L4-L5 lumbar fusion 02/2008  Denies current use of antibiotics. +Xarelto r/t hx of PE  Denies loss of bowel/bladder function.    PRIOR MEDICATIONS:   Effective  Lyrica  NSAID  Acetaminophen  Tizanidine    Ineffective  Gabapentin     Unable to tolerate      Never  Ami/Nortriptyline  Cymbalta  Tizanidine      PRIOR INTERVENTIONS:  Lumbar fusion 2009  Effective  TFESI - transient  SCS    Ineffective  Lumbar RFA        Past Medical History:  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    hex lupus anticoagulant    CAD (coronary artery disease)     Carpal tunnel syndrome of left wrist     emg by  Dr Lynnae Sandhoff at [facility] shows severe median neuropathy    Cataract     Cateract Surgery Dec 2022    Degenerative disc disease, lumbar     Diabetes (HCC)     Dyslipidemia     Erectile dysfunction     Fatty liver     Heart murmur Nov 2020    HLD (hyperlipidemia) 01/08/2008    HX: anticoagulation     Hypertension 01/08/2008    Hypertriglyceridemia     Idiopathic polyneuropathy     Joint pain Jul 2008    Lumbar radiculopathy     Nerve injury     NICM (nonischemic cardiomyopathy) (HCC)     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    09/2015 Mild on prior assessments     Obesity     Osteoporosis     Other malignant neoplasm without specification of site Dec 2023    Pneumonia 2008    Positive ANA (antinuclear antibody) 02/03/2016    Positive ANA (antinuclear antibody) 02/03/2016    Pulmonary embolism (HCC) 2015 -- july 20th    takes xaralto anticoagulant -- treated at [facility]    Spinal headache May 1993    Spinal stenosis 04 Sep 2015       Family History:  Family History   Problem Relation Name Age of Onset    DVT Mother Hershal Eriksson     Arthritis Mother Rykker Coviello     Joint Pain Mother [name]     Cancer Father Thoma Paulsen         Prostate cancer    DVT Brother      Stroke Paternal Grandfather [name]     Cancer Paternal Grandmother [name]     Arthritis Paternal Grandmother Canine Milford     Stroke Maternal Grandfather [name]     Cancer Maternal Grandmother [name], applications         Colon Cancer       Social History:  Lives in [place] [id number]    Social History     Socioeconomic History    Marital status: Married   Tobacco Use    Smoking status: Former     Current packs/day: 0.00     Average packs/day: 2.0 packs/day for 15.0 years (30.0 ttl pk-yrs)     Types: Cigarettes     Start date: 08/23/1976     Quit date: 08/24/1991     Years since quitting: 31.3     Passive exposure: Past    Smokeless tobacco: Never   Vaping Use    Vaping status: Never Used   Substance and Sexual Activity    Alcohol use: No    Drug use: No    Sexual activity: Not Currently     Partners: Female     Birth control/protection: None   Other Topics Concern    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, [employer]       Allergies:  Allergies   Allergen Reactions    Atorvastatin FLUSHING (SKIN), SEE COMMENTS and HIVES     rash      Niacin FLUSHING (SKIN) and SEE COMMENTS     Other reaction(s): Red flush, itching  Other reaction(s): itching/redness    Other [Unclassified Drug] BLISTERS     dissolving stiches in mouth after dental surgery caused blisters Xtandi [Enzalutamide] DIARRHEA     Caused severe dehydration and 2 - 5 day hospitalizations    Rubber SEE COMMENTS  Neoprene rubber causes contact dermatitis (CAUSED BY THIOUREA IN NEOPRENE)       Medications:    Current Outpatient Medications:     alendronate (FOSAMAX) 70 mg tablet, Take one tablet by mouth every 7 days., Disp: , Rfl:     ezetimibe (ZETIA) 10 mg tablet, Take one tablet by mouth daily., Disp: 90 tablet, Rfl: 3    furosemide (LASIX) 20 mg tablet, Take one tablet by mouth daily as needed., Disp: 90 tablet, Rfl: 1    leuprolide 3 month (LUPRON DEPOT (3 MONTH)) 11.25 mg/1.5 mL injection, every 90 days., Disp: , Rfl:     lisinopriL (ZESTRIL) 5 mg tablet, Take one tablet by mouth daily., Disp: 90 tablet, Rfl: 3    [START ON 02/01/2023] metFORMIN (GLUCOPHAGE) 1,000 mg tablet, Take one tablet by mouth twice daily with meals., Disp: 180 tablet, Rfl: 3    metoprolol succinate XL (TOPROL XL) 50 mg extended release tablet, Take one tablet by mouth daily. Take 25 mg daily for one week then 50 mg daily, Disp: 90 tablet, Rfl: 3    oxyCODONE-acetaminophen (PERCOCET) 5-325 mg tablet, Take one tablet by mouth every 6 hours as needed for Pain for up to 30 days., Disp: 30 tablet, Rfl: 0    POTASSIUM CHLORIDE PO, Take 90 mg by mouth daily., Disp: , Rfl:     pregabalin (LYRICA) 200 mg capsule, Take one capsule by mouth twice daily., Disp: 180 capsule, Rfl: 3    rivaroxaban (XARELTO) 20 mg tablet, Take one tablet by mouth daily with dinner., Disp: 90 tablet, Rfl: 3    rosuvastatin (CRESTOR) 20 mg tablet, Take one tablet by mouth daily., Disp: 90 tablet, Rfl: 1    semaglutide (OZEMPIC) 1 mg/dose (4 mg/3 mL) injection PEN, Inject one mg under the skin every 7 days., Disp: 9 mL, Rfl: 0    spironolactone (ALDACTONE) 25 mg tablet, Take one tablet by mouth daily., Disp: 90 tablet, Rfl: 1    tamsulosin (FLOMAX) 0.4 mg capsule, Take one capsule by mouth daily., Disp: , Rfl:     tiZANidine (ZANAFLEX) 2 mg tablet, Take one tablet to two tablets by mouth at bedtime as needed., Disp: 60 tablet, Rfl: 2    traMADoL (ULTRAM) 50 mg tablet, Take one tablet by mouth every 8 hours as needed., Disp: 90 tablet, Rfl: 2    Physical examination:   Pulse 71  - Ht 177.8 cm (5' 10)  - Wt 117.9 kg (260 lb)  - SpO2 98%  - BMI 37.31 kg/m?   Pain Score: Four  Oswestry Total Score:: 58    Physical Exam:  General: The patient is a well-developed, well nourished 72 y.o. male in no acute distress.   HEENT: Head is normocephalic and atraumatic. Pupils are equal and reactive to light bilaterally.   Cardiac: Based on palpation, pulse appears to be regular rate and rhythm.   Pulmonary: The patient has unlabored respirations and bilateral symmetric chest excursion.   Abdomen: Soft, nontender, and nondistended.   Extremities: No clubbing, cyanosis, or edema.     Neurologic:   The patient is alert and oriented times 3.       Musculoskeletal:   Gait is antalgic - walker    Lumbar spine:  Appearance: No lesions or deformity  Lumbar tenderness: moderate tenderness, increased paraspinal muscle tone  SI joint tenderness: None  Pain with extension: Yes  Facet loading positive  Pain with lateral flexion: None  FABER positive?: None  Lower extremity strength: 5/5  bilaterally  Sensation to light touch: Intact and equal in the bilateral lower extremities  Straight leg raise positive?: Bilateral    Skin   Midline and pocket incisions well approximated and well healed, no sx of infection drainage, erythema or swelling.       MRI L-spine 02/15/2022  FINDINGS:     Reported spinal stimulator lead migration and interval revision, poorly   evaluated on the current imaging modality. Prior L4-5 posterior spinal   fusion with associated signal distortion. Stable mild anterolisthesis at   L4-L5 and mild retrolisthesis at L1-2 and L2-L3. Left lateral listhesis   L3-L4 and a left convexity lumbar curvature. Chronic mild degenerative   vertebral body height loss and multilevel degenerative marrow endplate   changes along the inner scoliotic curvature.     The conus is normal in position and appearance at the L1 level.     T11-T12: Likely at least moderate central spinal stenosis, suboptimally   evaluated due to exclusion on the axial field of view.     T12-L1: Disc bulge and facet hypertrophy results in mild to moderate   trefoil type central spinal stenosis and mild bilateral foraminal   stenosis.     L1-L2: Retrolisthesis, degenerative disc disease, disc osteophyte complex,   facet hypertrophy, and ligamentum flavum thickening results in   moderate-severe trefoil type central spinal stenosis and moderate   bilateral foraminal stenosis.     L2-L3: Retrolisthesis, degenerative disc disease and eccentric right-sided   disc osteophyte complex, facet hypertrophy, and ligamentum flavum   thickening results in moderate-severe trefoil type central spinal stenosis   and severe right and at least moderate left foraminal stenosis.     L3-L4: Marked degenerative disc disease and circumferential disc   osteophyte complex, facet hypertrophy, and ligamentum flavum thickening   results in severe trefoil type central spinal stenosis and severe right   and at least moderate left foraminal stenosis.     L4-L5: No obvious bony compromise of the spinal canal or foramina.   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     L5-S1: Circumferential disc osteophyte complex and facet hypertrophy   results in severe bilateral foraminal stenosis and at least mild bilateral   lateral recess stenosis.     Marked right iliopsoas and bilateral gluteal (left greater than right)   muscular atrophy.     IMPRESSION       1.  Prior L4-L5 posterior spinal fusion and reported spinal stimulator   lead migration and interval revision, poorly evaluated on the current   imaging modality.   2.  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   3.  Multilevel degenerative foraminal stenosis most pronounced of severe   degree on the right at L2-L3 and L3-4 and bilaterally at L5-S1.   4.  Chronic multilevel spondylolisthesis and left convexity lumbar   curvature.   5.  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   6.  Marked right iliopsoas and bilateral gluteal (left greater than right)   muscular atrophy.       Last Cr and LFT's:  Creatinine   Date Value Ref Range Status   11/14/2022 0.65 0.4 - 1.24 MG/DL Final     AST (SGOT)   Date Value Ref Range Status   11/14/2022 19 7 -  40 U/L Final     ALT (SGPT)   Date Value Ref Range Status   11/14/2022 20 7 - 56 U/L Final     Alk Phosphatase   Date Value Ref Range Status   11/14/2022 31 25 - 110 U/L Final     Total Bilirubin   Date Value Ref Range Status   11/14/2022 0.8 0.2 - 1.3 MG/DL Final          Assessment:    Thorin Starner is a 72 y.o. male who  has a past medical history of Adverse drug reaction, Aneurysm (HCC), Arthritis, Ascending aortic aneurysm (HCC) (01/08/2008), Asymptomatic PVCs (03/11/2015), Blood clotting disorder (HCC) (05 Apr 2017), CAD (coronary artery disease), Carpal tunnel syndrome of left wrist, Cataract, Degenerative disc disease, lumbar, Diabetes (HCC), Dyslipidemia, Erectile dysfunction, Fatty liver, Heart murmur (Nov 2020), HLD (hyperlipidemia) (01/08/2008), anticoagulation, Hypertension (01/08/2008), Hypertriglyceridemia, Idiopathic polyneuropathy, Joint pain (Jul 2008), Lumbar radiculopathy, Nerve injury, NICM (nonischemic cardiomyopathy) (HCC), 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 who presents for evaluation of pain.    The pain complaints are most likely due to:    1. Lumbosacral radiculopathy        2. DDD (degenerative disc disease), lumbosacral        3. Encounter for long-term (current) use of high-risk medication                  Patient has had an adequate trial of > 12 month of rest, exercise, multimodal treatment, and the passage of time without improvement of symptoms. The pain has significant impact on the daily quality of life.     Plan:    1.Discussed care options with patient. Will continue with 2nd opinion for surgical intervention. Incisions well healed, okay to submerge.   2. Will provide one time Percocet 5/325mg  1 tab daily PRN #30 for severe pain. Discussed risks and benefits of medication therapy and possible SEs, patient verbalized understanding and all questions answered.  Discussed if patient requesting further refills will need updated office visit and possible contract/UDS, patient verbalized understanding.   3.Continue tizanidine as prescribed, refill provided.   4. Encouraged to continue at home PT program as tolerated.  5. Follow up after removal        Risks/benefits of all pharmacologic and interventional treatments discussed and questions answered.

## 2023-01-01 ENCOUNTER — Encounter: Admit: 2023-01-01 | Discharge: 2023-01-01 | Payer: MEDICARE

## 2023-01-01 ENCOUNTER — Ambulatory Visit: Admit: 2023-01-01 | Discharge: 2023-01-02 | Payer: MEDICARE

## 2023-01-01 DIAGNOSIS — I2510 Atherosclerotic heart disease of native coronary artery without angina pectoris: Secondary | ICD-10-CM

## 2023-01-01 DIAGNOSIS — I729 Aneurysm of unspecified site: Secondary | ICD-10-CM

## 2023-01-01 DIAGNOSIS — G5602 Carpal tunnel syndrome, left upper limb: Secondary | ICD-10-CM

## 2023-01-01 DIAGNOSIS — R768 Other specified abnormal immunological findings in serum: Secondary | ICD-10-CM

## 2023-01-01 DIAGNOSIS — E669 Obesity, unspecified: Secondary | ICD-10-CM

## 2023-01-01 DIAGNOSIS — E1142 Type 2 diabetes mellitus with diabetic polyneuropathy: Secondary | ICD-10-CM

## 2023-01-01 DIAGNOSIS — I7121 Ascending aortic aneurysm (HCC): Secondary | ICD-10-CM

## 2023-01-01 DIAGNOSIS — M810 Age-related osteoporosis without current pathological fracture: Secondary | ICD-10-CM

## 2023-01-01 DIAGNOSIS — M5416 Radiculopathy, lumbar region: Secondary | ICD-10-CM

## 2023-01-01 DIAGNOSIS — M2550 Pain in unspecified joint: Secondary | ICD-10-CM

## 2023-01-01 DIAGNOSIS — C801 Malignant (primary) neoplasm, unspecified: Secondary | ICD-10-CM

## 2023-01-01 DIAGNOSIS — R011 Cardiac murmur, unspecified: Secondary | ICD-10-CM

## 2023-01-01 DIAGNOSIS — M1990 Unspecified osteoarthritis, unspecified site: Secondary | ICD-10-CM

## 2023-01-01 DIAGNOSIS — I428 Other cardiomyopathies: Secondary | ICD-10-CM

## 2023-01-01 DIAGNOSIS — M4800 Spinal stenosis, site unspecified: Secondary | ICD-10-CM

## 2023-01-01 DIAGNOSIS — G609 Hereditary and idiopathic neuropathy, unspecified: Secondary | ICD-10-CM

## 2023-01-01 DIAGNOSIS — M5136 Other intervertebral disc degeneration, lumbar region: Secondary | ICD-10-CM

## 2023-01-01 DIAGNOSIS — T50905A Adverse effect of unspecified drugs, medicaments and biological substances, initial encounter: Secondary | ICD-10-CM

## 2023-01-01 DIAGNOSIS — E119 Type 2 diabetes mellitus without complications: Secondary | ICD-10-CM

## 2023-01-01 DIAGNOSIS — D689 Coagulation defect, unspecified: Secondary | ICD-10-CM

## 2023-01-01 DIAGNOSIS — G5603 Carpal tunnel syndrome, bilateral upper limbs: Secondary | ICD-10-CM

## 2023-01-01 DIAGNOSIS — T148XXA Other injury of unspecified body region, initial encounter: Secondary | ICD-10-CM

## 2023-01-01 DIAGNOSIS — K760 Fatty (change of) liver, not elsewhere classified: Secondary | ICD-10-CM

## 2023-01-01 DIAGNOSIS — I493 Ventricular premature depolarization: Secondary | ICD-10-CM

## 2023-01-01 DIAGNOSIS — I10 Essential (primary) hypertension: Secondary | ICD-10-CM

## 2023-01-01 DIAGNOSIS — I351 Nonrheumatic aortic (valve) insufficiency: Secondary | ICD-10-CM

## 2023-01-01 DIAGNOSIS — E781 Pure hyperglyceridemia: Secondary | ICD-10-CM

## 2023-01-01 DIAGNOSIS — J189 Pneumonia, unspecified organism: Secondary | ICD-10-CM

## 2023-01-01 DIAGNOSIS — E785 Hyperlipidemia, unspecified: Secondary | ICD-10-CM

## 2023-01-01 DIAGNOSIS — H269 Unspecified cataract: Secondary | ICD-10-CM

## 2023-01-01 DIAGNOSIS — N529 Male erectile dysfunction, unspecified: Secondary | ICD-10-CM

## 2023-01-01 DIAGNOSIS — I2699 Other pulmonary embolism without acute cor pulmonale: Secondary | ICD-10-CM

## 2023-01-01 DIAGNOSIS — Z9229 Personal history of other drug therapy: Secondary | ICD-10-CM

## 2023-01-01 DIAGNOSIS — G971 Other reaction to spinal and lumbar puncture: Secondary | ICD-10-CM

## 2023-01-01 NOTE — Progress Notes
Date of Service: 01/01/2023    Subjective:             Victor Graham is a 72 y.o. male.    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 Prostate cancer was diagnosed in december and his primary focus has been taking care of this diagnosis.   Today patient reports that since his last appointment he has been getting worsening left lower back pain accompanied by pain shooting down his left leg and into the left foot described as a shooting pain. Had an appointment with pain management yesterday due to chronic back pain. Scheduled to see the a spine surgeon for his back on 01/16/23. Also had pain in his feet, but is taking lyrica and feels like it has been helping. Continues to use the walker when walking lose distances. Most recent A1C was 6%. Patient tired of pain in his back but looking forward up coming spine surgery appointment.    Review of Systems   Constitutional:  Negative for chills and fever.   Respiratory:  Negative for shortness of breath.    Cardiovascular:  Negative for chest pain.   Gastrointestinal:  Negative for diarrhea and nausea.   Neurological:  Positive for weakness and numbness.       Allergies   Allergen Reactions    Atorvastatin FLUSHING (SKIN), SEE COMMENTS and HIVES     rash      Niacin FLUSHING (SKIN) and SEE COMMENTS     Other reaction(s): Red flush, itching  Other reaction(s): itching/redness    Other [Unclassified Drug] BLISTERS     dissolving stiches in mouth after dental surgery caused blisters    Xtandi [Enzalutamide] DIARRHEA     Caused severe dehydration and 2 - 5 day hospitalizations    Rubber SEE COMMENTS     Neoprene rubber causes contact dermatitis (CAUSED BY THIOUREA IN NEOPRENE)       Objective:          alendronate (FOSAMAX) 70 mg tablet Take one tablet by mouth every 7 days.    ezetimibe (ZETIA) 10 mg tablet Take one tablet by mouth daily.    furosemide (LASIX) 20 mg tablet Take one tablet by mouth daily as needed.    leuprolide 3 month (LUPRON DEPOT (3 MONTH)) 11.25 mg/1.5 mL injection every 90 days.    lisinopriL (ZESTRIL) 5 mg tablet Take one tablet by mouth daily.    [START ON 02/01/2023] metFORMIN (GLUCOPHAGE) 1,000 mg tablet Take one tablet by mouth twice daily with meals.    metoprolol succinate XL (TOPROL XL) 50 mg extended release tablet Take one tablet by mouth daily. Take 25 mg daily for one week then 50 mg daily    oxyCODONE-acetaminophen (PERCOCET) 5-325 mg tablet Take one tablet by mouth every 6 hours as needed for Pain for up to 30 days.    POTASSIUM CHLORIDE PO Take 90 mg by mouth daily.    pregabalin (LYRICA) 200 mg capsule Take one capsule by mouth twice daily.    rivaroxaban (XARELTO) 20 mg tablet Take one tablet by mouth daily with dinner.    rosuvastatin (CRESTOR) 20 mg tablet Take one tablet by mouth daily.    semaglutide (OZEMPIC) 1 mg/dose (4 mg/3 mL) injection PEN Inject one mg under the skin every 7 days.    spironolactone (ALDACTONE) 25 mg tablet Take one tablet by mouth daily.    tamsulosin (FLOMAX) 0.4  mg capsule Take one capsule by mouth daily.    tiZANidine (ZANAFLEX) 2 mg tablet Take one tablet to two tablets by mouth at bedtime as needed.    traMADoL (ULTRAM) 50 mg tablet Take one tablet by mouth every 8 hours as needed.     Vitals:    01/01/23 1117   BP: 108/68   Pulse: 75   PainSc: Five   Weight: 118.8 kg (262 lb)   Height: 177.8 cm (5' 10)     Body mass index is 37.59 kg/m?Marland Kitchen     Physical Exam  Neurological examination:   Mental Status: Alert, oriented to time, place and person   CN II thru XII: Pupils 3mm, reactive to light and accommodation, intact extraocular movements, intact facial sensation,  intact hearing, symmetric palatal elevation, able to shrug shoulders equally on both sides, tongue midline.     Palpebral Fissure 9/22mm   Orb Oculi 5/5   Orb Oris 5/5   Tongue  5/5      Speech: No Dysarthria  Motor Exam:     Neck flexors: 5   Neck extensors: 5        Right Left   Right Left   Shoulder abductors: 5 5 Hip flexion: 5 5   Elbow flexors: 5 5 Hip abduction: 5 5   Elbow extensors: 5 5 Hip Adduction: 5 5   Wrist extensors: 5 5 Knee extension: 5 5   Wrist flexors: 5 5 Knee flexion: 5 5   Finger flexors: 5 5 Ankle dorsiflexion: 4 5-   Finger extensors: 5 5 Ankle plantar flexion: 4 4   Finger abductors: 5 5 Ankle eversion: 5 5   Thumb abductors: 5 5 Ankle inversion: 5 5         Toe extension: 3 3         Toe flexion: 3 4-     Scar on left hand from recent CTS surgery  No high arches or hammer toes    Sensory:   Pinprick diminished up midcalf bilaterally  Vibration: Absent vibration on right great toe, 8 seconds on left great toe  Proprioception absent at toes, present at ankles bilaterally.    Coordination: Normal finger to nose bilaterally.     Reflexes:   Reflex   Right   Left     Brachioradialis   1 1   Biceps    1 1   Triceps 0 0   Knee 0 0   Ankle   0 0   Jaw Jerk          Right   Left     Plantar response mute mute   Hoffman?s  absent absent      Gait and Station: wide-based antalgic gait. Difficulty with toe walking, using a wheeled walker.      MRI lumbar spine  IMPRESSION       1. Prior L4-L5 posterior spinal fusion and reported spinal stimulator   lead migration and interval revision, poorly evaluated on the current   imaging modality.   2.  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   3.  Multilevel degenerative foraminal stenosis most pronounced of severe   degree on the right at L2-L3 and L3-4 and bilaterally at L5-S1.   4.  Chronic multilevel spondylolisthesis and left convexity lumbar   curvature.   5. 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   6.  Marked right iliopsoas and bilateral gluteal (left greater than right)   muscular atrophy. 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    PLAN  1) Pregabalin 200mg  bid, discussed that can we can discuss adding Duloxetine when talking with pain management in addition to lyrica.  2) Discussed topical agents including voltaren.  3) Control diet, blood sugars, and exercises  4) RTC in 1 year, earlier if symptoms  5) Continue pain management clinic follow up  6) Continue spine surgery clinic appointment.    Dub Amis, MD  Clinical neurophysiology Fellow      I personally performed the key portions of the E/M visit, discussed case with Dr Hoelscher's and concur with Dr Hoelscher documentation of history, physical exam, assessment, and treatment plan. HEre is my brief summary  Victor Graham is here for follow-up of his radiculopathy, lumbar stenosis and diabetic distal symmetric sensorimotor polyneuropathy.  He reports of overall being stable as far as his neuropathy pain in the feet is concerned except for some intermittent sharp pain in the toes at night.  However he reports his main problem is his back pain with radiation into the legs.  He continues to be followed by pain management clinic and is also scheduled for another opinion by an outside spine surgeon for his back on 01/16/2023.  He continues to be on Lyrica 200 mg twice daily and feels that this has been helping.  He is using a walker when walking long distances especially due to the back pain.  His recent hemoglobin A1c was 6%.  He went through multiple different treatments by pain management clinic including spinal cord stimulator.  He had treatment for his prostate cancer and presently his PSA has been stable and is being followed closely.  His examination is not significantly different from the last visit.  Cranial nerve examination is intact and upper extremity strength is diffusely normal, lower extremity he has ankle dorsiflexion weakness on the right with normal strength on the left, plantar ankle plantarflexion weakness bilaterally, toe extension weakness bilaterally and toe flexion weakness.  He had decreased pinprick up to the mid forelegs in the lower extremities normal in the upper extremities and absent vibratory sensation on the, Right great toe and 8 seconds on the left, decreased proprioception on the right present on the left.  Absent reflexes in the lower extremity normal in the upper extremity  Straight leg raising test was positive with pain reported especially on the left.  Gait and Station wide-based gait antalgic due to the back pain difficulty with heel walking and unable to toe walk and using a wheeled walker  The MRI of the lumbar spine was reviewed which showed the severe stenosis at L3-L4 and moderate to severe at L1-L2 and L2-2 to L3  Assessment/plan  Lumbar stenosis with chronic back pain and radiculopathy symptoms which appears to be the main issue for him at this time.  Continue follow-up with pain management clinic and agree with the surgical evaluation  2.  Diabetic distal symmetric sensorimotor polyneuropathy with present pain in the feet controlled mostly with pregabalin which we will continue and I also recommended using Voltaren gel as needed for the severe sharp pain intermittent that  he experiences intermittently.  Controlled diet and blood sugars and continue exercises.  3.  Bilateral carpal tunnel syndrome status post surgery on the left  4.  Prostate cancer followed by oncology patient presently reports of stable PSA  Patient presently is being seen in the clinic mainly for his pregabalin prescription and can be followed yearly or if this can be obtained through the PCP office he can be seen as needed.              Total of 40 minutes were spent on the same day of the visit including preparing to see the patient, obtaining and/or reviewing separately obtained history, performing a medically appropriate examination and/or evaluation, counseling and educating the patient/family/caregiver, ordering medications, tests, or procedures, referring and communication with other health care professionals, documenting clinical information in the electronic or other health record, independently interpreting results and communicating results to the patient/family/caregiver, and care coordination.  Portions of this record were generated with Dragon, a dictation software. There may be typos or grammatical errors. Please contact my office for any documentation clarifications.

## 2023-01-10 ENCOUNTER — Encounter: Admit: 2023-01-10 | Discharge: 2023-01-10 | Payer: MEDICARE

## 2023-01-10 DIAGNOSIS — I428 Other cardiomyopathies: Secondary | ICD-10-CM

## 2023-01-10 MED ORDER — SPIRONOLACTONE 25 MG PO TAB
25 mg | ORAL_TABLET | Freq: Every day | ORAL | 1 refills | 90.00000 days | Status: AC
Start: 2023-01-10 — End: ?

## 2023-01-16 ENCOUNTER — Encounter: Admit: 2023-01-16 | Discharge: 2023-01-16 | Payer: MEDICARE

## 2023-02-01 ENCOUNTER — Encounter: Admit: 2023-02-01 | Discharge: 2023-02-01 | Payer: MEDICARE

## 2023-02-01 ENCOUNTER — Ambulatory Visit: Admit: 2023-02-01 | Discharge: 2023-02-02 | Payer: MEDICARE

## 2023-02-01 DIAGNOSIS — G5602 Carpal tunnel syndrome, left upper limb: Secondary | ICD-10-CM

## 2023-02-01 DIAGNOSIS — R011 Cardiac murmur, unspecified: Secondary | ICD-10-CM

## 2023-02-01 DIAGNOSIS — H269 Unspecified cataract: Secondary | ICD-10-CM

## 2023-02-01 DIAGNOSIS — M4800 Spinal stenosis, site unspecified: Secondary | ICD-10-CM

## 2023-02-01 DIAGNOSIS — J189 Pneumonia, unspecified organism: Secondary | ICD-10-CM

## 2023-02-01 DIAGNOSIS — I10 Essential (primary) hypertension: Secondary | ICD-10-CM

## 2023-02-01 DIAGNOSIS — J849 Interstitial pulmonary disease, unspecified: Secondary | ICD-10-CM

## 2023-02-01 DIAGNOSIS — E669 Obesity, unspecified: Secondary | ICD-10-CM

## 2023-02-01 DIAGNOSIS — T50905A Adverse effect of unspecified drugs, medicaments and biological substances, initial encounter: Secondary | ICD-10-CM

## 2023-02-01 DIAGNOSIS — Z9229 Personal history of other drug therapy: Secondary | ICD-10-CM

## 2023-02-01 DIAGNOSIS — D689 Coagulation defect, unspecified: Secondary | ICD-10-CM

## 2023-02-01 DIAGNOSIS — M810 Age-related osteoporosis without current pathological fracture: Secondary | ICD-10-CM

## 2023-02-01 DIAGNOSIS — M5416 Radiculopathy, lumbar region: Secondary | ICD-10-CM

## 2023-02-01 DIAGNOSIS — C801 Malignant (primary) neoplasm, unspecified: Secondary | ICD-10-CM

## 2023-02-01 DIAGNOSIS — I729 Aneurysm of unspecified site: Secondary | ICD-10-CM

## 2023-02-01 DIAGNOSIS — N529 Male erectile dysfunction, unspecified: Secondary | ICD-10-CM

## 2023-02-01 DIAGNOSIS — T148XXA Other injury of unspecified body region, initial encounter: Secondary | ICD-10-CM

## 2023-02-01 DIAGNOSIS — E785 Hyperlipidemia, unspecified: Secondary | ICD-10-CM

## 2023-02-01 DIAGNOSIS — I351 Nonrheumatic aortic (valve) insufficiency: Secondary | ICD-10-CM

## 2023-02-01 DIAGNOSIS — I428 Other cardiomyopathies: Secondary | ICD-10-CM

## 2023-02-01 DIAGNOSIS — E119 Type 2 diabetes mellitus without complications: Secondary | ICD-10-CM

## 2023-02-01 DIAGNOSIS — K760 Fatty (change of) liver, not elsewhere classified: Secondary | ICD-10-CM

## 2023-02-01 DIAGNOSIS — M48062 Spinal stenosis, lumbar region with neurogenic claudication: Secondary | ICD-10-CM

## 2023-02-01 DIAGNOSIS — E781 Pure hyperglyceridemia: Secondary | ICD-10-CM

## 2023-02-01 DIAGNOSIS — I2699 Other pulmonary embolism without acute cor pulmonale: Secondary | ICD-10-CM

## 2023-02-01 DIAGNOSIS — I7121 Ascending aortic aneurysm (HCC): Secondary | ICD-10-CM

## 2023-02-01 DIAGNOSIS — I493 Ventricular premature depolarization: Secondary | ICD-10-CM

## 2023-02-01 DIAGNOSIS — M51369 Degenerative disc disease, lumbar: Secondary | ICD-10-CM

## 2023-02-01 DIAGNOSIS — M2550 Pain in unspecified joint: Secondary | ICD-10-CM

## 2023-02-01 DIAGNOSIS — I2510 Atherosclerotic heart disease of native coronary artery without angina pectoris: Secondary | ICD-10-CM

## 2023-02-01 DIAGNOSIS — R768 Other specified abnormal immunological findings in serum: Secondary | ICD-10-CM

## 2023-02-01 DIAGNOSIS — M1990 Unspecified osteoarthritis, unspecified site: Secondary | ICD-10-CM

## 2023-02-01 DIAGNOSIS — G971 Other reaction to spinal and lumbar puncture: Secondary | ICD-10-CM

## 2023-02-01 DIAGNOSIS — G609 Hereditary and idiopathic neuropathy, unspecified: Secondary | ICD-10-CM

## 2023-02-01 MED ORDER — OZEMPIC 2 MG/DOSE (8 MG/3 ML) SC PNIJ
2 mg | SUBCUTANEOUS | 3 refills | Status: AC
Start: 2023-02-01 — End: ?

## 2023-02-01 MED ORDER — PREGABALIN 200 MG PO CAP
200 mg | ORAL_CAPSULE | Freq: Two times a day (BID) | ORAL | 3 refills | Status: DC
Start: 2023-02-01 — End: 2023-02-01

## 2023-02-01 MED ORDER — ROSUVASTATIN 20 MG PO TAB
20 mg | ORAL_TABLET | Freq: Every day | ORAL | 1 refills | 90.00000 days | Status: AC
Start: 2023-02-01 — End: ?

## 2023-02-01 MED ORDER — PREGABALIN 200 MG PO CAP
200 mg | ORAL_CAPSULE | Freq: Two times a day (BID) | ORAL | 3 refills | Status: AC
Start: 2023-02-01 — End: ?

## 2023-02-01 NOTE — Progress Notes
Date of Service: 02/01/2023    Victor Graham is a 72 y.o. male.  DOB: Apr 21, 1950  MRN: [id number]     Subjective:               Patient also sees providers through the [place]. He reports he is having difficulty with his back as well as mobility. He has been working with Neurosurgery and Pain Clinic at [facility] but is planning to seek the opinion of the [place]. He has had previous venous stasis ulcers of his legs but they have resolved. He continues with venous stasis but wears compression garments. He has a known history of DM, HTN and HLD. He is followed by Cardiology for CAD, s/p Thoracic AA repair and Cardiomyopathy. He also has a history of prostate cancer and is followed by an outside Oncology team. He has a history of ILD and is followed by Pulmonary as well.  At our last visit we held his Metformin and he is here for f/u.  His HgbA1c has increased but only slightly above goal.     Diet adherence:most of the time   Medication adherence:most of the time     Diabetes Management:  The patient has not had hypoglycemic reactions  Lab Results   Component Value Date/Time    HGBA1C 8.5 (H) 10/30/2021 10:51 AM    HGBA1C 6.9 (H) 10/24/2020 10:42 AM    HGBA1C 6.9 (H) 06/01/2020 02:06 PM    A1C 7.3 (A) 02/01/2023 12:00 AM    HGBPOC 12.9 (L) 07/07/2008 11:49 AM    CHOL 98 11/01/2022 08:35 AM    TRIG 131 11/01/2022 08:35 AM    HDL 42 11/01/2022 08:35 AM    LDL 44 11/01/2022 08:35 AM    VLDL 26 11/01/2022 08:35 AM    NONHDLCHOL 56 11/01/2022 08:35 AM    CR 0.65 11/14/2022 09:58 AM    MCALBR 9.7 11/15/2021 10:36 AM    ALT 20 11/14/2022 09:58 AM    CK 255 (H) 01/06/2019 03:47 PM    VITD25 46.5 10/20/2019 09:03 AM        Microalbumin tested in last 12 months?  Yes  Eye exam within the last 12 months? Yes  Comprehensive Foot exam within the last 12 months? Yes  Pneumonia shot current? Yes  The patient is taking an ACE inhibitor or an ARB:Yes   The patient is taking a statin:Yes    Hypertension Management:    Outside blood pressures being performed: Yes  BP Readings from Last 3 Encounters:   02/01/23 124/66   01/01/23 108/68   12/19/22 95/53     He denies significant light-headedness.    Hyperlipidemia Management  Side effects to medications? No               Assessment and Plan:  1. Type 2 diabetes mellitus with diabetic polyneuropathy, with long-term current use of insulin (HCC)  Diabetes Mellitus    Plan:   Discussed general issues about diabetes pathophysiology and management.  Discussed exercise management and diet with emphasis on vegetables, fruit and lean meat.  Discussed foot care.  Reminded to get retinal exam annually and dental appointment every 6 months.  Treatment goals: A1C < or = 7.0       BP <140/90  Are barriers to achieving goals present? No  Medication education provided. Patient voiced understanding? Yes  Patient able to self-manage and ready to comply? Yes  Educational resources identified? Verbal Counseling  - He is going to  increase his Ozempic to 2mg  weekly.  He wants to finish out the meds he currently has at home (1mg ) and then will start it.  He will let us know how he tolerates.  He will return in 3 months for recheck.  - I reviewed with the patient their current medications and specifically any new medications prescribed at the time of this visit and we reviewed the expected benefits and potential side effects. All questions are answered to the patient's satisfaction.   - POC HEMOGLOBIN A1C  - semaglutide (OZEMPIC) 2 mg/dose (8 mg/3 mL) injection PEN; Inject two mg under the skin every 7 days.  Dispense: 3 mL; Refill: 3  - pregabalin (LYRICA) 200 mg capsule; Take one capsule by mouth twice daily.  Dispense: 180 capsule; Refill: 3    2. Essential hypertension  Hypertension    Plan:   Discussed hypertension and reviewed goals.  Are barriers to achieving goals present? No  Medication education provided. Patient voiced understanding? Yes  Patient able to self-manage and ready to comply? Yes  Educational resources identified? Verbal Counseling     3. Hyperlipidemia, unspecified hyperlipidemia type  Hyperlipidemia    Plan:  Discussed labs and reviewed goals for LDL, HDL, triglycerides.   Discussed exercise management and diet with emphasis on vegetables, fruit and lean meat.  Are barriers to achieving goals present? No  Medication education provided. Patient voiced understanding? Yes  Patient able to self-manage and ready to comply?Yes  Educational resources identified? Verbal Counseling      - rosuvastatin (CRESTOR) 20 mg tablet; Take one tablet by mouth daily.  Dispense: 90 tablet; Refill: 1    4. Lumbar radiculopathy  5. Lumbar stenosis with neurogenic claudication  - I have reviewed the patient's medical history in detail and updated the computerized patient record.  - continue with Pain Clinic.  He is considering an appointment with an outside Neurosurgery group for evaluation  - I reviewed with the patient their current medications and specifically any new medications prescribed at the time of this visit and we reviewed the expected benefits and potential side effects. All questions are answered to the patient's satisfaction.   - pregabalin (LYRICA) 200 mg capsule; Take one capsule by mouth twice daily.  Dispense: 180 capsule; Refill: 3    6. ILD (interstitial lung disease) (HCC)  - I have reviewed the patient's medical history in detail and updated the computerized patient record.  - continue with Pulmonary.   I appreciate their assistance in care of the patient.       7. Need for COVID-19 vaccine  - I have reviewed the patient's medical history in detail and updated the computerized patient record.    - COVID-19 VACCINE (>=12YO) (MODERNA) MRNA PF                    Review of Systems      Objective:          alendronate (FOSAMAX) 70 mg tablet Take one tablet by mouth every 7 days.    ezetimibe (ZETIA) 10 mg tablet Take one tablet by mouth daily.    furosemide (LASIX) 20 mg tablet Take one tablet by mouth daily as needed.    leuprolide 3 month (LUPRON DEPOT (3 MONTH)) 11.25 mg/1.5 mL injection every 90 days.    lisinopriL (ZESTRIL) 5 mg tablet Take one tablet by mouth daily.    metoprolol succinate XL (TOPROL XL) 50 mg extended release tablet Take one tablet by mouth daily.  Take 25 mg daily for one week then 50 mg daily    naloxone (NARCAN) 4 mg/actuation nasal spray     POTASSIUM CHLORIDE PO Take 90 mg by mouth daily.    pregabalin (LYRICA) 200 mg capsule Take one capsule by mouth twice daily.    rivaroxaban (XARELTO) 20 mg tablet Take one tablet by mouth daily with dinner.    rosuvastatin (CRESTOR) 20 mg tablet Take one tablet by mouth daily.    semaglutide (OZEMPIC) 1 mg/dose (4 mg/3 mL) injection PEN Inject one mg under the skin every 7 days.    semaglutide (OZEMPIC) 2 mg/dose (8 mg/3 mL) injection PEN Inject two mg under the skin every 7 days.    spironolactone (ALDACTONE) 25 mg tablet Take one tablet by mouth daily.    tamsulosin (FLOMAX) 0.4 mg capsule Take one capsule by mouth daily.    tiZANidine (ZANAFLEX) 2 mg tablet Take one tablet to two tablets by mouth at bedtime as needed.    traMADoL (ULTRAM) 50 mg tablet Take one tablet by mouth every 8 hours as needed.     Vitals:    02/01/23 0818   BP: 124/66   BP Source: Arm, Right Upper   Pulse: 72   Temp: 37.1 ?C (98.7 ?F)   Resp: 15   TempSrc: Temporal   Weight: 119.9 kg (264 lb 6.4 oz)     Body mass index is 37.94 kg/m?Marland Kitchen     Physical Exam  Vitals reviewed.   Constitutional:       Appearance: Normal appearance.   HENT:      Head: Normocephalic and atraumatic.      Right Ear: External ear normal.      Left Ear: External ear normal.      Nose: Nose normal.      Mouth/Throat:      Mouth: Mucous membranes are moist.   Eyes:      Extraocular Movements: Extraocular movements intact.      Conjunctiva/sclera: Conjunctivae normal.   Cardiovascular:      Rate and Rhythm: Normal rate and regular rhythm.      Heart sounds: Normal heart sounds. No murmur heard.  Pulmonary: Effort: Pulmonary effort is normal.      Breath sounds: Normal breath sounds. No wheezing.   Musculoskeletal:      Cervical back: Neck supple. No muscular tenderness.   Lymphadenopathy:      Cervical: No cervical adenopathy.   Skin:     General: Skin is warm.      Capillary Refill: Capillary refill takes less than 2 seconds.      Findings: No rash.   Neurological:      Mental Status: He is alert. Mental status is at baseline.      Motor: Weakness present.      Gait: Gait abnormal.   Psychiatric:         Mood and Affect: Mood normal.         Behavior: Behavior normal.         Judgment: Judgment normal.

## 2023-02-02 DIAGNOSIS — E1142 Type 2 diabetes mellitus with diabetic polyneuropathy: Secondary | ICD-10-CM

## 2023-02-02 DIAGNOSIS — Z794 Long term (current) use of insulin: Secondary | ICD-10-CM

## 2023-02-02 DIAGNOSIS — M5416 Radiculopathy, lumbar region: Secondary | ICD-10-CM

## 2023-02-02 DIAGNOSIS — Z23 Encounter for immunization: Secondary | ICD-10-CM

## 2023-02-15 ENCOUNTER — Encounter: Admit: 2023-02-15 | Discharge: 2023-02-15 | Payer: MEDICARE

## 2023-02-15 NOTE — Telephone Encounter
02/15/2023 1:26 PM   Maureen w/ [facility] MRI dept calling asking about details from 2010 surg for AAA repair. I faxed Op Report to [phone]

## 2023-02-21 ENCOUNTER — Ambulatory Visit: Admit: 2023-02-21 | Discharge: 2023-02-22 | Payer: MEDICARE

## 2023-02-21 ENCOUNTER — Encounter: Admit: 2023-02-21 | Discharge: 2023-02-21 | Payer: MEDICARE

## 2023-02-24 ENCOUNTER — Encounter: Admit: 2023-02-24 | Discharge: 2023-02-24 | Payer: MEDICARE

## 2023-03-14 ENCOUNTER — Encounter: Admit: 2023-03-14 | Discharge: 2023-03-14 | Payer: MEDICARE

## 2023-03-21 ENCOUNTER — Encounter: Admit: 2023-03-21 | Discharge: 2023-03-21 | Payer: MEDICARE

## 2023-03-21 NOTE — Telephone Encounter
PA initiated and submitted on CMM for Ozempic.    PA approved until 04/07/2098. Case ID: [id number].    MM, CMA

## 2023-04-02 ENCOUNTER — Encounter: Admit: 2023-04-02 | Discharge: 2023-04-02 | Payer: MEDICARE

## 2023-05-02 ENCOUNTER — Encounter: Admit: 2023-05-02 | Discharge: 2023-05-02 | Payer: MEDICARE

## 2023-05-06 ENCOUNTER — Encounter: Admit: 2023-05-06 | Discharge: 2023-05-06 | Payer: MEDICARE

## 2023-05-06 ENCOUNTER — Ambulatory Visit: Admit: 2023-05-06 | Discharge: 2023-05-06 | Payer: MEDICARE

## 2023-05-06 DIAGNOSIS — E785 Hyperlipidemia, unspecified: Secondary | ICD-10-CM

## 2023-05-06 DIAGNOSIS — J849 Interstitial pulmonary disease, unspecified: Secondary | ICD-10-CM

## 2023-05-06 DIAGNOSIS — C61 Malignant neoplasm of prostate: Secondary | ICD-10-CM

## 2023-05-06 DIAGNOSIS — M5416 Radiculopathy, lumbar region: Secondary | ICD-10-CM

## 2023-05-06 DIAGNOSIS — I10 Essential (primary) hypertension: Secondary | ICD-10-CM

## 2023-05-06 DIAGNOSIS — N401 Enlarged prostate with lower urinary tract symptoms: Secondary | ICD-10-CM

## 2023-05-06 DIAGNOSIS — I428 Other cardiomyopathies: Secondary | ICD-10-CM

## 2023-05-06 DIAGNOSIS — E66812 Class 2 severe obesity due to excess calories with serious comorbidity and body mass index (BMI) of 38.0 to 38.9 in adult (HCC): Secondary | ICD-10-CM

## 2023-05-06 MED ORDER — TAMSULOSIN 0.4 MG PO CAP
.4 mg | ORAL_CAPSULE | Freq: Every day | ORAL | 3 refills | 90.00000 days | Status: AC
Start: 2023-05-06 — End: ?

## 2023-05-06 MED ORDER — METFORMIN 500 MG PO TAB
500 mg | ORAL_TABLET | Freq: Two times a day (BID) | ORAL | 3 refills | Status: AC
Start: 2023-05-06 — End: ?

## 2023-05-06 NOTE — Progress Notes
Date of Service: 05/06/2023    Victor Graham is a 73 y.o. male.  DOB: 1951-02-21  MRN: [id number]     Subjective:               Patient is here for f/u evaluation of the increase in his ozempic.  He reports he is tolerating it well but has noticed some weight loss but this has flattened off.  He denies any GI distress.  He had been on Metformin in the past and tolerated it but this was stopped during a hospitalization.  It was never restarted and we were hoping the ozempic would be enough to control his sugars.  He has a known history of HTN, HLD and cardiomyopathy.  He is treated with diet, activity and meds.  He is also followed by Cardiology.  He has a known history of prostate cancer and BPH.  He is followed by Oncology.  He has a known history of ILD and is followed by Pulmonary as well.  Additionally, he has a long history of low back pain, difficulty with ambulation and lumbar radiculopathy.  He is working with an outside Careers adviser and reports he is going to schedule a surgery in the near future.  He also follows with Pain Management.      Diet adherence:most of the time   Medication adherence:most of the time     Diabetes Management:  The patient has not had hypoglycemic reactions  Lab Results   Component Value Date/Time    HGBA1C 8.5 (H) 10/30/2021 10:51 AM    HGBA1C 6.9 (H) 10/24/2020 10:42 AM    HGBA1C 6.9 (H) 06/01/2020 02:06 PM    A1C 8.2 (A) 05/06/2023 12:00 AM    HGBPOC 12.9 (L) 07/07/2008 11:49 AM    CHOL 98 11/01/2022 08:35 AM    TRIG 131 11/01/2022 08:35 AM    HDL 42 11/01/2022 08:35 AM    LDL 44 11/01/2022 08:35 AM    VLDL 26 11/01/2022 08:35 AM    NONHDLCHOL 56 11/01/2022 08:35 AM    CR 0.65 11/14/2022 09:58 AM    MCALBR 9.7 11/15/2021 10:36 AM    ALT 20 11/14/2022 09:58 AM    CK 255 (H) 01/06/2019 03:47 PM    VITD25 46.5 10/20/2019 09:03 AM        Microalbumin tested in last 12 months?  Yes  Eye exam within the last 12 months? Yes  Comprehensive Foot exam within the last 12 months? Yes  Pneumonia shot current? Yes  The patient is taking an ACE inhibitor or an ARB:Yes   The patient is taking a statin:Yes    Hypertension Management:    Outside blood pressures being performed: Yes  BP Readings from Last 3 Encounters:   05/06/23 118/72   02/01/23 124/66   01/01/23 108/68     He denies significant light-headedness.    Hyperlipidemia Management  Side effects to medications? No                   Assessment and Plan:  1. Type 2 diabetes mellitus with diabetic polyneuropathy, without long-term current use of insulin (HCC) (Primary)  Diabetes Mellitus    Plan:   Discussed general issues about diabetes pathophysiology and management.  Discussed exercise management and diet with emphasis on vegetables, fruit and lean meat.  Discussed foot care.  Reminded to get retinal exam annually and dental appointment every 6 months.  Treatment goals: A1C < or = 7.0  BP <140/90  Are barriers to achieving goals present? No  Medication education provided. Patient voiced understanding? Yes  Patient able to self-manage and ready to comply? Yes  Educational resources identified? Verbal Counseling  - his HgbA1c is good but not at goal.  He is asymptomatic but I would like to try to lower it further, particularly with a potential upcoming surgery.    - will add metformin at 500mg  BID to his current Ozempic.  I reviewed with the patient their current medications and specifically any new medications prescribed at the time of this visit and we reviewed the expected benefits and potential side effects. All questions are answered to the patient's satisfaction.   - he will return in 3 months for recheck.  - POC HEMOGLOBIN A1C  - metFORMIN (GLUCOPHAGE) 500 mg tablet; Take one tablet by mouth twice daily after meals.  Dispense: 180 tablet; Refill: 3    2. Hyperlipidemia, unspecified hyperlipidemia type  Hyperlipidemia    Plan:  Discussed labs and reviewed goals for LDL, HDL, triglycerides.   Discussed exercise management and diet with emphasis on vegetables, fruit and lean meat.  Are barriers to achieving goals present? No  Medication education provided. Patient voiced understanding? Yes  Patient able to self-manage and ready to comply?Yes  Educational resources identified? Verbal Counseling        3. Essential hypertension  Hypertension    Plan:   Discussed hypertension and reviewed goals.  Are barriers to achieving goals present? No  Medication education provided. Patient voiced understanding? Yes  Patient able to self-manage and ready to comply? Yes  Educational resources identified? Verbal Counseling     4. Non-ischemic cardiomyopathy (HCC)  - I have reviewed the patient's medical history in detail and updated the computerized patient record.  - continue with Cardiology.   I appreciate their assistance in care of the patient.       5. Malignant neoplasm of prostate (HCC)  6. Benign prostatic hyperplasia with urinary hesitancy  - I have reviewed the patient's medical history in detail and updated the computerized patient record.  - continue with Oncology.   I appreciate their assistance in care of the patient.     - I reviewed with the patient their current medications and specifically any new medications prescribed at the time of this visit and we reviewed the expected benefits and potential side effects. All questions are answered to the patient's satisfaction.   - tamsulosin (FLOMAX) 0.4 mg capsule; Take one capsule by mouth daily. Do not crush, chew or open capsules. Take 30 minutes following the same meal each day.  Dispense: 90 capsule; Refill: 3    7. ILD (interstitial lung disease) (HCC)  - I have reviewed the patient's medical history in detail and updated the computerized patient record.  - continue with Pulmonary.   I appreciate their assistance in care of the patient.       8. Class 2 severe obesity due to excess calories with serious comorbidity and body mass index (BMI) of 38.0 to 38.9 in adult [facility])  I have reviewed the patient's medical history in detail and updated the computerized patient record.  Discussed general issues about obesity pathophysiology and management.  Discussed exercise management and diet with emphasis on vegetables, fruit and lean meat.  Educational resources identified? Verbal Counseling     9. Lumbar radiculopathy  - I have reviewed the patient's medical history in detail and updated the computerized patient record.  - continue with Pain Management  and Surgery.   I appreciate their assistance in care of the patient.                       Review of Systems      Objective:         ? alendronate (FOSAMAX) 70 mg tablet Take one tablet by mouth every 7 days.   ? ezetimibe (ZETIA) 10 mg tablet Take one tablet by mouth daily.   ? furosemide (LASIX) 20 mg tablet Take one tablet by mouth daily as needed.   ? leuprolide 3 month (LUPRON DEPOT (3 MONTH)) 11.25 mg/1.5 mL injection every 90 days.   ? lisinopriL (ZESTRIL) 5 mg tablet Take one tablet by mouth daily.   ? metFORMIN (GLUCOPHAGE) 500 mg tablet Take one tablet by mouth twice daily after meals.   ? metoprolol succinate XL (TOPROL XL) 50 mg extended release tablet Take one tablet by mouth daily. Take 25 mg daily for one week then 50 mg daily   ? naloxone (NARCAN) 4 mg/actuation nasal spray    ? POTASSIUM CHLORIDE PO Take 90 mg by mouth daily.   ? pregabalin (LYRICA) 200 mg capsule Take one capsule by mouth twice daily.   ? rivaroxaban (XARELTO) 20 mg tablet Take one tablet by mouth daily with dinner.   ? rosuvastatin (CRESTOR) 20 mg tablet Take one tablet by mouth daily.   ? semaglutide (OZEMPIC) 2 mg/dose (8 mg/3 mL) injection PEN Inject two mg under the skin every 7 days.   ? spironolactone (ALDACTONE) 25 mg tablet Take one tablet by mouth daily.   ? tamsulosin (FLOMAX) 0.4 mg capsule Take one capsule by mouth daily. Do not crush, chew or open capsules. Take 30 minutes following the same meal each day.   ? tiZANidine (ZANAFLEX) 2 mg tablet Take one tablet to two tablets by mouth at bedtime as needed.   ? traMADoL (ULTRAM) 50 mg tablet Take one tablet by mouth every 8 hours as needed.     Vitals:    05/06/23 0848   BP: 118/72   BP Source: Arm, Left Upper   Pulse: 70   Temp: 36.8 ?C (98.3 ?F)   SpO2: 96%   TempSrc: Temporal   PainSc: Five   Weight: 120.6 kg (265 lb 12.8 oz)   Height: 177.8 cm (5' 10)     Body mass index is 38.14 kg/m?Marland Kitchen     Physical Exam  Vitals reviewed.   Constitutional:       Appearance: Normal appearance.   HENT:      Head: Normocephalic and atraumatic.      Right Ear: External ear normal.      Left Ear: External ear normal.      Nose: Nose normal.      Mouth/Throat:      Mouth: Mucous membranes are moist.   Eyes:      Extraocular Movements: Extraocular movements intact.      Conjunctiva/sclera: Conjunctivae normal.   Cardiovascular:      Rate and Rhythm: Normal rate and regular rhythm.      Heart sounds: Normal heart sounds. No murmur heard.  Pulmonary:      Effort: Pulmonary effort is normal.      Breath sounds: Normal breath sounds. No wheezing.   Musculoskeletal:      Cervical back: Neck supple. No muscular tenderness.   Lymphadenopathy:      Cervical: No cervical adenopathy.   Skin:     General:  Skin is warm.      Capillary Refill: Capillary refill takes less than 2 seconds.      Findings: No rash.   Neurological:      Mental Status: He is alert. Mental status is at baseline.      Motor: Weakness present.      Coordination: Coordination abnormal.      Gait: Gait abnormal.   Psychiatric:         Mood and Affect: Mood normal.         Behavior: Behavior normal.         Judgment: Judgment normal.

## 2023-05-07 DIAGNOSIS — E1142 Type 2 diabetes mellitus with diabetic polyneuropathy: Secondary | ICD-10-CM

## 2023-05-07 DIAGNOSIS — Z794 Long term (current) use of insulin: Secondary | ICD-10-CM

## 2023-05-07 DIAGNOSIS — R3911 Hesitancy of micturition: Secondary | ICD-10-CM

## 2023-05-16 ENCOUNTER — Encounter: Admit: 2023-05-16 | Discharge: 2023-05-16 | Payer: MEDICARE

## 2023-05-17 ENCOUNTER — Ambulatory Visit: Admit: 2023-05-17 | Discharge: 2023-05-18 | Payer: MEDICARE

## 2023-05-17 ENCOUNTER — Encounter: Admit: 2023-05-17 | Discharge: 2023-05-17 | Payer: MEDICARE

## 2023-05-17 DIAGNOSIS — J029 Acute pharyngitis, unspecified: Secondary | ICD-10-CM

## 2023-05-17 DIAGNOSIS — H9202 Otalgia, left ear: Secondary | ICD-10-CM

## 2023-05-17 NOTE — Progress Notes
Date of Service: 05/17/2023    Victor Graham is a 73 y.o. male.  DOB: 05/02/1950  MRN: [id number]     Subjective:             Chief Complaint   Patient presents with    Ear Pain     Left ear x1 week. Patient went to UC in [place] and was given amoxicillin. Last dose is tomorrow       History of Present Illness    Patient is a 73 year old male here for an urgent care visit.  He reports a 1 week history of mild sore throat and left ear pain.  About 5 days ago he was seen at a different clinic and prescribed amoxicillin for a left ear infection.  He started to feel better, but 2 days ago developed left ear pain again.  The ear pain is disrupting his sleep.  The sore throat is mild when he swallows.      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), CAD (coronary artery disease), Carpal tunnel syndrome of left wrist (emg by Dr Lynnae Sandhoff at [facility] shows severe median neuropathy), Cataract (Sep 2019) [facility] Surgery Dec 2022), Constipation, Degenerative disc disease, lumbar, Diabetes (HCC), Dyslipidemia, Erectile dysfunction, Fatty liver, Heart murmur (Nov 2020), HLD (hyperlipidemia) (01/08/2008), anticoagulation, Hypertension (01/08/2008), Hypertriglyceridemia, Idiopathic polyneuropathy, Joint pain (Jul 2008), Lumbar radiculopathy, Nerve injury, NICM (nonischemic cardiomyopathy) (HCC), 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    He has a past surgical history that includes joint replacement (Right, 06/2010); Hernia repair (01/2013) (x2); fasciotomy (Right, 1992); laparoscopy surg rpr initial inguinal hernia; knee surgery (Right, 02/2007) (Torn meniscus removed); aaa repair (06/2008); knee replacement (Left, 03/2013); cyst removal (Left, 06/2013) (wrist); lumbar fusion (02/2008) (L4-5); stimulator implant (12/2014) (Spinal stimulator [employer]); cyst removal (Right, 08/2017) (wrist); shoulder surgery (08/2018) (reversed arthroplasty); Colonoscopy (N/A, 03/18/2019) (COLONOSCOPY DIAGNOSTIC WITH SPECIMEN COLLECTION BY BRUSHING/ WASHING - FLEXIBLE performed by Lenor Derrick, MD at [facility] OR); Colonoscopy (N/A, 03/18/2019) (COLONOSCOPY WITH CONTROL OF BLEEDING performed by Lenor Derrick, MD at [facility] OR); Colonoscopy (N/A, 03/18/2019) (COLONOSCOPY WITH SUBMUCOSAL INJECTION performed by Lenor Derrick, MD at [facility] OR); arthroscopic surgery; umbilical arterial cath - bedside; tonsillectomy (1956); NEUROSTIMULATOR PROCEDURE (Bilateral, 12/22/2021) (*[employer] Battery Revision* REMOVAL/ REVISION SPINAL NEUROSTIMULATOR PULSE GENERATOR/ RECEIVER performed by Gabriel Rung, MD at [facility] OR); NEUROSTIMULATOR PROCEDURE (Bilateral, 12/22/2021) (REVISION/ REPLACEMENT SPINAL NEUROSTIMULATOR ELECTRODE PERCUTANEOUS ARRAY WITH FLUOROSCOPY performed by Gabriel Rung, MD at [facility] OR); echocardiogram procedure; cardiovascular stress test; NEUROSTIMULATOR PROCEDURE (Bilateral, 11/09/2022) Marland KitchenAbbott SCS Explant* REMOVAL IMPLANTED SPINAL NEUROSTIMULATOR PULSE GENERATOR/ RECEIVER- WITH DETACHABLE CONNECTION ELECTRODE ARRAY performed by Gabriel Rung, MD at [facility] OR); and stimulator explant (Bilateral, 11/09/2022) (REMOVAL SPINAL NEUROSTIMULATOR ELECTRODE PERCUTANEOUS ARRAY performed by Gabriel Rung, MD at [facility] OR).    Victor Graham's family history includes Arthritis in his mother and paternal grandmother; Cancer in his father, maternal grandmother, and paternal grandmother; DVT  in his brother and mother; Joint Pain in his mother; Stroke in his maternal grandfather and paternal grandfather.        Review of Systems   HENT:  Positive for ear pain and sore throat.    Respiratory:  Negative for cough.          Objective:          alendronate (FOSAMAX) 70 mg tablet Take one tablet by mouth every 7 days.    amoxicillin (AMOXIL) 875 mg tablet Take one tablet by mouth.    ezetimibe (ZETIA) 10 mg tablet Take one tablet by mouth daily.    furosemide (LASIX) 20 mg tablet Take one tablet by mouth daily as needed.    leuprolide 3 month (LUPRON DEPOT (3 MONTH)) 11.25 mg/1.5 mL injection every 90 days.    lisinopriL (ZESTRIL) 5 mg tablet Take one tablet by mouth daily.    metFORMIN (GLUCOPHAGE) 500 mg tablet Take one tablet by mouth twice daily after meals.    metoprolol succinate XL (TOPROL XL) 50 mg extended release tablet Take one tablet by mouth daily. Take 25 mg daily for one week then 50 mg daily    naloxone (NARCAN) 4 mg/actuation nasal spray     POTASSIUM CHLORIDE PO Take 90 mg by mouth daily.    pregabalin (LYRICA) 200 mg capsule Take one capsule by mouth twice daily.    rivaroxaban (XARELTO) 20 mg tablet Take one tablet by mouth daily with dinner.    rosuvastatin (CRESTOR) 20 mg tablet Take one tablet by mouth daily.    semaglutide (OZEMPIC) 2 mg/dose (8 mg/3 mL) injection PEN Inject two mg under the skin every 7 days.    spironolactone (ALDACTONE) 25 mg tablet Take one tablet by mouth daily.    tamsulosin (FLOMAX) 0.4 mg capsule Take one capsule by mouth daily. Do not crush, chew or open capsules. Take 30 minutes following the same meal each day.    tiZANidine (ZANAFLEX) 2 mg tablet Take one tablet to two tablets by mouth at bedtime as needed.    traMADoL (ULTRAM) 50 mg tablet Take one tablet by mouth every 8 hours as needed.     Vitals:    05/17/23 1009 BP: 107/69   BP Source: Arm, Left Upper   Pulse: 80   Temp: 98.1 ?F (36.7 ?C)   Resp: 18   SpO2: 99%   TempSrc: Oral     There is no height or weight on file to calculate BMI.     Physical Exam  Vitals and nursing note reviewed.   Constitutional:       General: He is not in acute distress.     Appearance: He is well-developed. He is not ill-appearing, toxic-appearing or diaphoretic.   HENT:      Head: Normocephalic.      Right Ear: Tympanic membrane, ear canal and external ear normal.      Left Ear: Tympanic membrane, ear canal and external ear normal.      Nose: Nose normal.      Mouth/Throat:      Mouth: Mucous membranes are moist.      Pharynx: Uvula midline. No oropharyngeal exudate or posterior oropharyngeal erythema.      Tonsils: No tonsillar exudate. 1+ on the right. 1+ on the left.        Comments: Erythema to left posterior pharynx  Eyes:      General: Lids are normal.      Extraocular Movements: Extraocular movements intact.  Conjunctiva/sclera: Conjunctivae normal.   Cardiovascular:      Rate and Rhythm: Normal rate and regular rhythm.      Heart sounds: Normal heart sounds, S1 normal and S2 normal.   Pulmonary:      Effort: Pulmonary effort is normal. No accessory muscle usage, prolonged expiration or respiratory distress.      Breath sounds: Normal breath sounds.   Musculoskeletal:      Cervical back: Neck supple.   Lymphadenopathy:      Cervical: No cervical adenopathy.      Right cervical: No superficial cervical adenopathy.     Left cervical: No superficial cervical adenopathy.   Skin:     General: Skin is warm and dry.   Neurological:      Mental Status: He is alert and oriented to person, place, and time.   Psychiatric:         Attention and Perception: Attention normal.         Mood and Affect: Mood normal.         Speech: Speech normal.         Behavior: Behavior normal.         Thought Content: Thought content normal.         Cognition and Memory: Cognition normal.         Judgment: Judgment normal.              Assessment and Plan:  Nash Shearer was seen today for ear pain.    Diagnoses and all orders for this visit:    Sore throat  -     POC STREP A PCR    Left ear pain       Results for orders placed or performed in visit on 05/17/23 (from the past 2 weeks)   POC STREP A PCR   Result Value Ref Range    Strep A, PCR POC Negative Negative    Strep A, PCR QC POC Pass           Ear exam normal, unclear etiology.

## 2023-06-09 ENCOUNTER — Encounter: Admit: 2023-06-09 | Discharge: 2023-06-09 | Payer: MEDICARE

## 2023-06-18 ENCOUNTER — Encounter: Admit: 2023-06-18 | Discharge: 2023-06-18 | Payer: MEDICARE

## 2023-06-21 ENCOUNTER — Encounter: Admit: 2023-06-21 | Discharge: 2023-06-21 | Payer: MEDICARE

## 2023-06-25 ENCOUNTER — Encounter: Admit: 2023-06-25 | Discharge: 2023-06-25 | Payer: MEDICARE

## 2023-06-25 ENCOUNTER — Ambulatory Visit: Admit: 2023-06-25 | Discharge: 2023-06-26 | Payer: MEDICARE

## 2023-06-25 DIAGNOSIS — Z01818 Encounter for other preprocedural examination: Secondary | ICD-10-CM

## 2023-06-25 NOTE — Patient Instructions
 Thank you for visiting our office today.   We recommend follow-up with our office in 6 months.   Please call [phone] if you're unable to schedule the office visit Dr. Iver Nestle ordered.     Please complete the following orders:     Schedule stress test    Take your medications as ordered.   Check your list with what you have on hand at home.   Should you have any additional questions or concerns, please message me through MyChart or call the office.    Cardiovascular Medicine Team   Clinic phone: [phone]    [facility]    Nuclear Stress Test Instructions      Your cardiologist has asked that you have a nuclear stress test (also known as a Myocardial Perfusion Imaging (MPI) test.    This evaluation of your heart muscle consists of two sets of nuclear images and either a Treadmill stress test or a chemical stress test, decided by you and your physician.    You will get an IV placed in your arm for the test.    You will need to be able to raise your arm up by your head for about 20 minutes and lie on your back for about 10 minutes.  Please discuss this with your doctor or talk with the nuclear technologist or nurse if these are a problem for you.    It is recommended not to schedule any other appointment for the same day.      Wear comfortable clothing and walking shoes if you are walking on the treadmill.  Women should wear shorts or comfortable pants instead of dresses.   Sweatshirts or T-shirts work really well for imaging.      There may be enough time to leave to get a snack after the stress portion of the test.   The Technologist will tell you what time to return for the second set of images.    PLEASE NO CAFFEINE 24 HOURS PRIOR TO TEST:  Examples include coffee, tea, decaffeinated drinks, colas, [facility], Dr. Reino Kent.  Some orange sodas and root beers have caffeine, please check.  No energy drinks, Excedrin, Midol, or any foods CONTAINING CHOCOLATE.   Consuming Caffeine may postpone your test.    PLEASE DO NOT EAT OR DRINK THE MORNING OF YOUR TEST.  Water is ok to drink with your morning medications.    Please hold these medications the day of test:  Lasix, metformin and spironolactone    DIABETIC PATIENTS:  if insulin dependent:  please take one third of your insulin with two pieces of dry toast and a small juice.  Bring remaining 2/3   insulin and oral diabetic medications with you to your test.    PLEASE NO TOBACCO PRODUCTS BETWEEN SCANS  You will NOT need a driver for this test.  But are welcome to bring a visitor with you.   Visitors will not be able to accompany you back to stress room.   Please do not bring children to the nuclear stress test.    TEST FINDINGS:   You will receive the results of the test within 7 business days of completion of this test by telephone. If you have any questions concerning your nuclear stress test or if you do not hear from your cardiologist/or nurse with 7 business days, please call our office.

## 2023-06-25 NOTE — Progress Notes
 Date of Service: 06/25/2023    Victor Graham is a 73 y.o. male.       HPI    Victor Graham is a pleasant 73 year old gentleman, seeking a clearance for his upcoming back surgery to be done by Dr. Madelaine Bhat Grooms of [facility] neurosurgery at [facility].  The surgery scheduled for 4/25.  The patient tells me that the surgery is anticipated to last 6 to 8 hours.      He is normally followed in our clinic for his mild nonischemic cardiomyopathy, mild to moderate coronary disease, essential hypertension, prior ascending aortic aneurysm repair and dyslipidemia.       He uses an upright walker-walking on level ground at a casual pace is fine.  It is problematic when he has to go up and down a flight of stairs.   The legs are weak and his balance is off.  He gets very short of breath.  He denies any chest discomfort.  The last stress test was 5 years ago.  It had shown a small mixed defect in the inferolateral wall.  Previous remote coronary angiography had shown mild nonobstructive CAD.  The echo from 2022 showed an EF of 45%.    He has had prostate radiation with improved urine flow.    The ascending aortic aneurysm repair was many years ago.  The last CT done in 2022-noncontrast study, appearance was similar to the one done in 2020.  The previous 2020 study was reviewed by  Dr. Helen Hashimoto of [facility] personally and he felt that there is nothing to worry about.     Cholesterol check in July 2024 was excellent.     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     He does not smoke.            Vitals:    06/25/23 1248   BP: 101/66   BP Source: Arm, Left Upper   Pulse: 83   SpO2: 94%   O2 Device: None (Room air)   PainSc: Zero   Weight: 118.8 kg (261 lb 14.4 oz)   Height: 177.8 cm (5' 10)     Body mass index is 37.58 kg/m?Marland Kitchen     Past Medical History  Patient Active Problem List    Diagnosis Date Noted    ILD (interstitial lung disease) (CMS-HCC) 02/01/2023    Lumbar stenosis with neurogenic claudication 09/10/2022    Malignant neoplasm of prostate (CMS-HCC) 04/06/2022     [facility], Dr Maximino Greenland,      Carpal tunnel syndrome of left wrist 07/06/2021     emg by Dr Lynnae Sandhoff at [facility] shows severe median neuropathy      Leg swelling 01/12/2021    Nummular eczema 10/24/2020    Type 2 diabetes mellitus with diabetic polyneuropathy, with long-term current use of insulin (CMS-HCC) 10/24/2020    History of deep venous thrombosis 03/24/2019    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 Started Rapaflo 8mg   01/26/14 - LUTS much improved. Continue Rapaflo 8mg   02/15/15 - stable LUTS      08/16/15- Minimal nocturia and no other symptoms. Interested in trialing discontinuation of his Rapaflo.  He will call if his symptoms worsen and we can refill his medications over the phone.     08/14/16 - symptoms stable, PVR 64mL       Class 3 severe obesity due to excess calories with serious comorbidity and body mass index (BMI) of 40.0 to 44.9 in adult (CMS-HCC) 10/28/2013    Cardiomyopathy, idiopathic (CMS-HCC) 06/06/2009     4/11 repeat echo EF 45%, mild AI  2/11 echo from PCPs office EF 35%. New finding compared to prior EF 45-50% in '09 (by echo at [facility]), LV gram from '09 reviewed- EF 45%      Status post thoracic aortic aneurysm repair 07/12/2008     3/10-   30 mm Hemashield graft used to replace the ascending aorta  02/2019- Stable focal dissection flap originating from the superior aspect of   the ascending aortic graft. Has been personally reviewed by Dr Helen Hashimoto and no concerns      Essential hypertension 01/08/2008     09-17-14: Well controlled. Taking Lisinopril 10 mg daily. Coreg 25 mg 0.5 tablet BID  Well controlled      Hyperlipidemia 01/08/2008 10-08-14: TC 164, HDL 35, TG 237. On Zocor 20 mg  07-07-14: TC 209, LDL 234, HDL 45, TG 234  11/14 TC 153, LDL 74, HDL 45, TG 172  1/14 good numbers  5/13 TC 152, LDL 76, HDL 39, TG 183  2/11 TC 147, LDL 81, HDL 42, TG 138  Panel checked in 6/10- TC 144, LDL 87, HDL 36, TG 131, on simva 40 qhs, intolerant of niacin      CAD (coronary artery disease) 01/08/2008     Mild to moderate, non-obstructive by angio in 10/09 done following an abnormal stress thallium            Review of Systems   Constitutional: Negative.   HENT: Negative.     Eyes: Negative.    Cardiovascular: Negative.    Respiratory: Negative.     Endocrine: Negative.    Hematologic/Lymphatic: Negative.    Skin: Negative.    Musculoskeletal: Negative.    Gastrointestinal: Negative.    Genitourinary: Negative.    Neurological: Negative.    Psychiatric/Behavioral: Negative.     Allergic/Immunologic: Negative.        Physical Exam    General Appearance: no acute distress  Skin: warm, moist, no ulcers  HEENT: unremarkable  Neck Veins: neck veins are flat, neck veins are not distended  Carotid Arteries: normal carotid upstroke bilaterally, no bruits  Chest Inspection: chest is normal in appearance  Auscultation/Percussion: lungs clear   Cardiac Rhythm: regular rhythm and normal rate  Cardiac Auscultation: Normal S1 & S2, no S3 or S4, no rub  Murmurs: soft esm base   Extremities: Only trivial edema   Abdominal Exam: soft, non-tender, no masses, bowel sounds normal  Neurologic Exam: neurological assessment grossly intact      Cardiovascular Health Factors  Vitals BP Readings from Last 3 Encounters:   06/25/23 101/66   05/17/23 107/69   05/06/23 118/72     Wt Readings from Last 3 Encounters:   06/25/23 118.8 kg (261 lb 14.4 oz)   05/06/23 120.6 kg (265 lb 12.8 oz)   02/01/23 119.9 kg (264 lb 6.4 oz)     BMI Readings from Last 3 Encounters:   06/25/23 37.58 kg/m?   05/06/23 38.14 kg/m?   02/01/23 37.94 kg/m?      Smoking Social History     Tobacco Use   Smoking  Status Former    Current packs/day: 0.00    Average packs/day: 2.0 packs/day for 15.0 years (30.0 ttl pk-yrs)    Types: Cigarettes    Start date: 08/23/1976    Quit date: 08/24/1991    Years since quitting: 31.8    Passive exposure: Past   Smokeless Tobacco Never      Lipid Profile Cholesterol   Date Value Ref Range Status   11/01/2022 98 <200 MG/DL Final     HDL   Date Value Ref Range Status   11/01/2022 42 >40 MG/DL Final     LDL   Date Value Ref Range Status   11/01/2022 44 <100 mg/dL Final     Triglycerides   Date Value Ref Range Status   11/01/2022 131 <150 MG/DL Final      Blood Sugar Hemoglobin A1C   Date Value Ref Range Status   10/30/2021 8.5 (H) 4.0 - 5.7 % Final     Comment:     The ADA recommends that most patients with type 1 and type 2 diabetes maintain   an A1c level <7%.       Glucose   Date Value Ref Range Status   11/14/2022 172 (H) 70 - 100 MG/DL Final   08/65/7846 962 (H) 70 - 100 MG/DL Final   95/28/4132 440 (H) 70 - 100 MG/DL Final     Glucose Fasting   Date Value Ref Range Status   09/01/2015 117 (H) 70 - 100 MG/DL Final     Glucose, POC   Date Value Ref Range Status   12/22/2021 164 (A) 70 - 100 Final   12/22/2021 197 (A) 70 - 100 Final     Glucose POC   Date Value Ref Range Status   03/18/2019 99 65 - 110 mg/dL Final          Problems Addressed Today  Encounter Diagnoses   Name Primary?    Hyperlipidemia, unspecified hyperlipidemia type Yes    Coronary artery disease involving native heart without angina pectoris, unspecified vessel or lesion type     Cardiomyopathy, idiopathic (CMS-HCC)     Pre-operative clearance              Assessment and Plan     In summary, Victor Graham is a 73 year old gentleman with the following cardiac and related issues:      History of mild nonischemic cardiomyopathy, last echo in April 2022, EF of 45%, seems to be euvolemic.  He is using furosemide 20 mg daily.           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   Dyslipidemia-  on Crestor with excellent LDL control as of 10/2022.    PVCs-currently asymptomatic.  Continue beta-blockers     History of ascending aortic aneurysm repair in 2010.  ? residual dissection reported in 2020.    Unchanged noncontrast CT in 2022.  CT with contrast done in 2024 and results are acceptable.    DM2, followed by Dr. Abel Presto     Needing extensive back surgery, stress test in 2020 was mildly abnl.  History of mild CAD by remote angiography.  Has poor exertional capacity.  Will arrange a pharmacological stress test before he undergoes the back operation.       It was a pleasure to see Victor Graham in the clinic today.  Current Medications (including today's revisions)   alendronate (FOSAMAX) 70 mg tablet Take one tablet by mouth every 7 days.    amoxicillin (AMOXIL) 875 mg tablet Take one tablet by mouth.    ezetimibe (ZETIA) 10 mg tablet Take one tablet by mouth daily.    furosemide (LASIX) 20 mg tablet Take one tablet by mouth daily as needed.    leuprolide 3 month (LUPRON DEPOT (3 MONTH)) 11.25 mg/1.5 mL injection every 90 days.    lisinopriL (ZESTRIL) 5 mg tablet Take one tablet by mouth daily.    metFORMIN (GLUCOPHAGE) 500 mg tablet Take one tablet by mouth twice daily after meals.    metoprolol succinate XL (TOPROL XL) 50 mg extended release tablet Take one tablet by mouth daily. Take 25 mg daily for one week then 50 mg daily    naloxone (NARCAN) 4 mg/actuation nasal spray     POTASSIUM CHLORIDE PO Take 90 mg by mouth daily.    pregabalin (LYRICA) 200 mg capsule Take one capsule by mouth twice daily.    rivaroxaban (XARELTO) 20 mg tablet Take one tablet by mouth daily with dinner.    rosuvastatin (CRESTOR) 20 mg tablet Take one tablet by mouth daily.    semaglutide (OZEMPIC) 2 mg/dose (8 mg/3 mL) injection PEN Inject two mg under the skin every 7 days.    spironolactone (ALDACTONE) 25 mg tablet Take one tablet by mouth daily.    tamsulosin (FLOMAX) 0.4 mg capsule Take one capsule by mouth daily. Do not crush, chew or open capsules. Take 30 minutes following the same meal each day.    tiZANidine (ZANAFLEX) 2 mg tablet Take one tablet to two tablets by mouth at bedtime as needed.    traMADoL (ULTRAM) 50 mg tablet Take one tablet by mouth every 8 hours as needed.

## 2023-06-26 DIAGNOSIS — I429 Cardiomyopathy, unspecified: Secondary | ICD-10-CM

## 2023-06-26 DIAGNOSIS — E785 Hyperlipidemia, unspecified: Secondary | ICD-10-CM

## 2023-06-26 DIAGNOSIS — I2510 Atherosclerotic heart disease of native coronary artery without angina pectoris: Secondary | ICD-10-CM

## 2023-06-28 ENCOUNTER — Encounter: Admit: 2023-06-28 | Discharge: 2023-06-28 | Payer: MEDICARE

## 2023-07-01 ENCOUNTER — Ambulatory Visit: Admit: 2023-07-01 | Discharge: 2023-07-01 | Payer: MEDICARE

## 2023-07-01 ENCOUNTER — Encounter: Admit: 2023-07-01 | Discharge: 2023-07-01 | Payer: MEDICARE

## 2023-07-07 ENCOUNTER — Encounter: Admit: 2023-07-07 | Discharge: 2023-07-07

## 2023-07-08 ENCOUNTER — Encounter: Admit: 2023-07-08 | Discharge: 2023-07-08

## 2023-07-08 DIAGNOSIS — I428 Other cardiomyopathies: Secondary | ICD-10-CM

## 2023-07-08 MED ORDER — SPIRONOLACTONE 25 MG PO TAB
25 mg | ORAL_TABLET | Freq: Every day | ORAL | 1 refills | 90.00000 days | Status: AC
Start: 2023-07-08 — End: ?

## 2023-07-08 NOTE — Telephone Encounter
 07/08/2023 7:00 AM    Refilled Spironolactone per pt. Request. Pt up to date on OV and labs (in care everywhere). Pt. CMP is stable.

## 2023-07-09 ENCOUNTER — Encounter: Admit: 2023-07-09 | Discharge: 2023-07-09

## 2023-07-09 ENCOUNTER — Ambulatory Visit: Admit: 2023-07-09 | Discharge: 2023-07-10

## 2023-07-09 DIAGNOSIS — J849 Interstitial pulmonary disease, unspecified: Secondary | ICD-10-CM

## 2023-07-09 DIAGNOSIS — M48062 Spinal stenosis, lumbar region with neurogenic claudication: Secondary | ICD-10-CM

## 2023-07-09 DIAGNOSIS — E1142 Type 2 diabetes mellitus with diabetic polyneuropathy: Secondary | ICD-10-CM

## 2023-07-09 DIAGNOSIS — I10 Essential (primary) hypertension: Secondary | ICD-10-CM

## 2023-07-09 DIAGNOSIS — I429 Cardiomyopathy, unspecified: Secondary | ICD-10-CM

## 2023-07-09 DIAGNOSIS — I2510 Atherosclerotic heart disease of native coronary artery without angina pectoris: Secondary | ICD-10-CM

## 2023-07-09 DIAGNOSIS — Z86718 Personal history of other venous thrombosis and embolism: Secondary | ICD-10-CM

## 2023-07-09 DIAGNOSIS — E785 Hyperlipidemia, unspecified: Secondary | ICD-10-CM

## 2023-07-09 DIAGNOSIS — Z01818 Encounter for other preprocedural examination: Secondary | ICD-10-CM

## 2023-07-09 DIAGNOSIS — E66812 Class 2 severe obesity with serious comorbidity and body mass index (BMI) of 37.0 to 37.9 in adult, unspecified obesity type (CMS-HCC): Secondary | ICD-10-CM

## 2023-07-09 NOTE — Progress Notes
 Date of Service: 07/09/2023    Victor Graham is a 72 y.o. male.  DOB: [birth year]  MRN: [id number]     Subjective:             Patient is well known to our clinic.  He has a fairly extensive list of medical histories but is overall stable in his healthcare.  He is seen by several specialists.  He has a known history of DM.  He has been managed by multiple meds but most recently his regimen has included ozempic and metformin.  In 10/24 his HgbA1c was at goal and we increased his ozempic and reduced his metformin however his repeat HgbA1c increased slightly.  He has increased his metformin back to the original dosage while keeping his Ozempic at the higher dose (2mg  weekly).  It is too early to repeat his HgbA1c yet.  He also has a history of CAD, cardiomyopathy, h/o unprovoked DVT, HTN and HLD.  He follows with Cardiology.  He has a history of ILD and is followed by Pulmonary.  Additionally, he has a chronic history of lumbar DDD, stenosis and radiculopathy.  He has an upcoming surgery planned and he is looking forward to functional improvement.      Diet adherence:most of the time   Medication adherence:most of the time     Diabetes Management:  The patient has not had hypoglycemic reactions  Lab Results   Component Value Date/Time    HGBA1C 8.5 (H) 10/30/2021 10:51 AM    HGBA1C 6.9 (H) 10/24/2020 10:42 AM    HGBA1C 6.9 (H) 06/01/2020 02:06 PM    A1C 8.2 (A) 05/06/2023 12:00 AM    HGBPOC 12.9 (L) 07/07/2008 11:49 AM    CHOL 98 11/01/2022 08:35 AM    TRIG 131 11/01/2022 08:35 AM    HDL 42 11/01/2022 08:35 AM    LDL 44 11/01/2022 08:35 AM    VLDL 26 11/01/2022 08:35 AM    NONHDLCHOL 56 11/01/2022 08:35 AM    CR 0.65 11/14/2022 09:58 AM    MCALBR 9.7 11/15/2021 10:36 AM    ALT 20 11/14/2022 09:58 AM    CK 255 (H) 01/06/2019 03:47 PM    VITD25 46.5 10/20/2019 09:03 AM        Microalbumin tested in last 12 months?  Yes  Eye exam within the last 12 months? Yes  Comprehensive Foot exam within the last 12 months? Yes  Pneumonia shot current? Yes  The patient is taking an ACE inhibitor or an ARB:Yes   The patient is taking a statin:Yes    Hypertension Management:    Outside blood pressures being performed: Yes  BP Readings from Last 3 Encounters:   07/09/23 (P) 110/68   06/25/23 101/66   05/17/23 107/69     He denies significant light-headedness.    Hyperlipidemia Management  Side effects to medications? No                     Assessment and Plan:  1. Preoperative clearance (Primary)  2. Lumbar stenosis with neurogenic claudication  - I have reviewed the patient's medical history in detail and updated the computerized patient record.  - as noted above he has an extensive medical history but overall has been very stable.  He has already been seen and evaluated by Cardiology and Pulmonary and been given clearance.  He did have a slight bump in his HgbA1c but we have adjusted his meds and I suspect his numbers have  improved but we are too early to repeat.  We discussed that no surgery is without risks of complications but overall I feel he has reduced his modifiable risks as much as he can.  Given his clearances by his other providers, I would agree for him to proceed with the surgery as planned.  We discussed that the final, ultimate surgical clearance would come from the surgeon but from a medical standpoint he is cleared to proceed.    3. Type 2 diabetes mellitus with diabetic polyneuropathy, without long-term current use of insulin (CMS-HCC)  Diabetes Mellitus    Plan:   Discussed general issues about diabetes pathophysiology and management.  Discussed exercise management and diet with emphasis on vegetables, fruit and lean meat.  Discussed foot care.  Reminded to get retinal exam annually and dental appointment every 6 months.  Treatment goals: A1C < or = 7.0       BP <140/90  Are barriers to achieving goals present? No  Medication education provided. Patient voiced understanding? Yes  Patient able to self-manage and ready to comply? Yes  Educational resources identified? Verbal Counseling  - he has already adjusted his regimen to Metformin 1000mg  BID and Ozempic 2mg  qweek.  He will continue on this for now.  He does report he had an elevated, morning fasting blood glucose.  I suspect this is a response to low blood glucose overnight and he will continue to check his sugars throughout the day.  If he is getting low blood glucose he will eat healthy snacks.  If it continues we may need to adjust his medication regimen  - he will f/u in May to recheck.  - I reviewed with the patient their current medications and specifically any new medications prescribed at the time of this visit and we reviewed the expected benefits and potential side effects. All questions are answered to the patient's satisfaction.     4. Essential hypertension  Hypertension    Plan:   Discussed hypertension and reviewed goals.  Are barriers to achieving goals present? No  Medication education provided. Patient voiced understanding? Yes  Patient able to self-manage and ready to comply? Yes  Educational resources identified? Verbal Counseling     5. Hyperlipidemia, unspecified hyperlipidemia type  Hyperlipidemia    Plan:  Discussed labs and reviewed goals for LDL, HDL, triglycerides.   Discussed exercise management and diet with emphasis on vegetables, fruit and lean meat.  Are barriers to achieving goals present? No  Medication education provided. Patient voiced understanding? Yes  Patient able to self-manage and ready to comply?Yes  Educational resources identified? Verbal Counseling        6. Cardiomyopathy, idiopathic (CMS-HCC)  7. Coronary artery disease involving native heart without angina pectoris, unspecified vessel or lesion type  - I have reviewed the patient's medical history in detail and updated the computerized patient record.  - continue with Cardiology.   I appreciate their assistance in care of the patient.       8. Class 2 severe obesity with serious comorbidity and body mass index (BMI) of 37.0 to 37.9 in adult, unspecified obesity type (CMS-HCC)  I have reviewed the patient's medical history in detail and updated the computerized patient record.  Discussed general issues about obesity pathophysiology and management.  Discussed exercise management and diet with emphasis on vegetables, fruit and lean meat.  Educational resources identified? Verbal Counseling     9. History of deep venous thrombosis  - I have reviewed the patient's medical  history in detail and updated the computerized patient record.  - continue with Cardiology.   I appreciate their assistance in care of the patient.       10. ILD (interstitial lung disease) (CMS-HCC)  - I have reviewed the patient's medical history in detail and updated the computerized patient record.  - continue with Pulmonary.   I appreciate their assistance in care of the patient.                      Review of Systems      Objective:         ? alendronate (FOSAMAX) 70 mg tablet Take one tablet by mouth every 7 days.   ? amoxicillin (AMOXIL) 875 mg tablet Take one tablet by mouth. (Patient not taking: Reported on 07/09/2023)   ? ezetimibe (ZETIA) 10 mg tablet Take one tablet by mouth daily.   ? furosemide (LASIX) 20 mg tablet Take one tablet by mouth daily as needed.   ? leuprolide 3 month (LUPRON DEPOT (3 MONTH)) 11.25 mg/1.5 mL injection every 90 days.   ? lisinopriL (ZESTRIL) 5 mg tablet Take one tablet by mouth daily.   ? metFORMIN (GLUCOPHAGE) 500 mg tablet Take one tablet by mouth twice daily after meals.   ? metoprolol succinate XL (TOPROL XL) 50 mg extended release tablet Take one tablet by mouth daily. Take 25 mg daily for one week then 50 mg daily   ? naloxone (NARCAN) 4 mg/actuation nasal spray    ? POTASSIUM CHLORIDE PO Take 90 mg by mouth daily.   ? pregabalin (LYRICA) 200 mg capsule Take one capsule by mouth twice daily.   ? rivaroxaban (XARELTO) 20 mg tablet Take one tablet by mouth daily with dinner. ? rosuvastatin (CRESTOR) 20 mg tablet Take one tablet by mouth daily.   ? semaglutide (OZEMPIC) 2 mg/dose (8 mg/3 mL) injection PEN Inject two mg under the skin every 7 days.   ? spironolactone (ALDACTONE) 25 mg tablet Take one tablet by mouth daily.   ? tamsulosin (FLOMAX) 0.4 mg capsule Take one capsule by mouth daily. Do not crush, chew or open capsules. Take 30 minutes following the same meal each day.   ? tiZANidine (ZANAFLEX) 2 mg tablet Take one tablet to two tablets by mouth at bedtime as needed.   ? traMADoL (ULTRAM) 50 mg tablet Take one tablet by mouth every 8 hours as needed.     Vitals:    07/09/23 0826   BP: (P) 110/68   BP Source: Arm, Right Upper   Pulse: (P) 82   Temp: 36.7 ?C (98.1 ?F)   SpO2: (P) 96%   TempSrc: Temporal   PainSc: Four   Weight: 118.2 kg (260 lb 9.6 oz)   Height: 177.8 cm (5' 10)     Body mass index is 37.39 kg/m?Marland Kitchen     Physical Exam  Vitals reviewed.   Constitutional:       Appearance: Normal appearance. He is well-developed.   HENT:      Head: Normocephalic and atraumatic.      Nose: Nose normal.   Eyes:      Conjunctiva/sclera: Conjunctivae normal.   Cardiovascular:      Rate and Rhythm: Normal rate.   Pulmonary:      Effort: Pulmonary effort is normal.   Skin:     General: Skin is warm.      Capillary Refill: Capillary refill takes less than 2 seconds.  Findings: No rash.   Neurological:      Mental Status: He is alert. Mental status is at baseline.   Psychiatric:         Behavior: Behavior normal.         Thought Content: Thought content normal.         Judgment: Judgment normal.

## 2023-07-22 ENCOUNTER — Encounter: Admit: 2023-07-22 | Discharge: 2023-07-22 | Payer: MEDICARE

## 2023-08-07 ENCOUNTER — Encounter: Admit: 2023-08-07 | Discharge: 2023-08-07 | Payer: MEDICARE

## 2023-08-07 NOTE — Telephone Encounter
 Hospital Discharge Follow Up      Reached Patient: No, transferred to Skilled Nursing Facility, [place]   Patient Date of Birth: Aug 02, 1950     Admission Information:     Hospital Name: [facility]  Admission Date: 08/02/23    Discharge Date: 08/06/23    Admission Diagnosis:  Lumbar stenosis  Discharge Diagnosis: Lumbar stenosis   Has there been a discharge within the last 30 days? No NA  Hospital Services: Unplanned  Today's call is 1 (business) days post discharge      Medication Reconciliation    Changes to pre-hospital medications? Yes  Start taking the following new medications:  CYCLOBENZAPRINE (FLEXERIL) 10 MG TAB  10 MILLIGRAM ORAL EVERY 8 HOURS AS NEEDED as needed for Muscle Spasms  Qty = 40  No Refills    oxyCODONE HCL (oxyCODONE IR) 10 MG TAB  10 MILLIGRAM ORAL EVERY 4 HOURS AS NEEDED as needed for Pain  Qty = 50  No Refills    The following medications have been changed:  Old:  RIVAROXABAN (XARELTO)  20 MILLIGRAM ORAL DAILY AT 1700    New:  RIVAROXABAN (XARELTO) 20 MG TAB  20 MILLIGRAM ORAL DAILY AT 1700  Instructions:  Ok to resume 4/30   Stop taking the following medications:  traMADol HCL (ULTRAM) 50 MG TAB  50 MILLIGRAM ORAL EVERY 8 HOURS AS NEEDED as needed for PAIN   Were new prescriptions filled? N/A  Meds reviewed and reconciled? Yes    Current Outpatient Medications   Medication Instructions    alendronate (FOSAMAX) 70 mg, EVERY  7 DAYS    ezetimibe (ZETIA) 10 mg, Oral, DAILY    furosemide (LASIX) 20 mg, Oral, DAILY  PRN    leuprolide 3 month (LUPRON DEPOT (3 MONTH)) 11.25 mg/1.5 mL injection every 90 days.    lisinopril (ZESTRIL) 5 mg, Oral, DAILY    metFORMIN (GLUCOPHAGE) 500 mg, Oral, TWICE DAILY AFTER MEALS    metoprolol succinate XL (TOPROL XL) 50 mg, Oral, DAILY, Take 25 mg daily for one week then 50 mg daily    naloxone (NARCAN) 4 mg/actuation nasal spray     OZEMPIC 2 mg, Subcutaneous, EVERY  7 DAYS    POTASSIUM CHLORIDE PO 90 mg, DAILY    pregabalin (LYRICA) 200 mg, Oral, TWICE DAILY    rivaroxaban (XARELTO) 20 mg, Oral, DAILY WITH DINNER    rosuvastatin (CRESTOR) 20 mg, Oral, DAILY    spironolactone (ALDACTONE) 25 mg, Oral, DAILY    tamsulosin (FLOMAX) 0.4 mg, Oral, DAILY, Do not crush, chew or open capsules. Take 30 minutes following the same meal each day.    tiZANidine (ZANAFLEX) 2-4 mg, Oral, AT BEDTIME PRN    traMADoL (ULTRAM) 50 mg, Oral, EVERY  8 HOURS PRN     Scheduling Follow-up Appointment   Upcoming appointments:   Future Appointments   Date Time Provider Department Center   08/27/2023  9:00 AM Rosaura Comp, MD [facility]   12/19/2023 11:00 AM CT - IC [facility]1CT [facility] Radiolog   12/20/2023 10:30 AM Diann Forth, MD [facility] Exam   12/24/2023 11:00 AM PF LAB SCHEDULE C [id number] None   12/24/2023  1:00 PM Tenny Felix, MD [facility] IM   01/02/2024 11:40 AM Pasnoor, Alla Ar, MD [facility] Neurology     Does the patient require 7 day follow up appointment? No  Hospital Follow-Up scheduled with PCP? No, pt d/cd to SNF  When was patient?s last PCP visit: 07/09/2023  PCP primary location: [facility]  Specialist appointment scheduled? Yes, with Cardiology 12/20/23  Is assistance with transportation needed? Unable to assess   MyChart message sent? Active in MyChart. No message sent.   Artera text sent? No    Annitta Basil, RN

## 2023-08-12 ENCOUNTER — Encounter: Admit: 2023-08-12 | Discharge: 2023-08-12 | Payer: MEDICARE

## 2023-09-03 ENCOUNTER — Encounter: Admit: 2023-09-03 | Discharge: 2023-09-03 | Payer: MEDICARE

## 2023-09-03 DIAGNOSIS — E785 Hyperlipidemia, unspecified: Secondary | ICD-10-CM

## 2023-09-03 MED ORDER — ROSUVASTATIN 20 MG PO TAB
20 mg | ORAL_TABLET | Freq: Every day | ORAL | 1 refills | 90.00000 days | Status: AC
Start: 2023-09-03 — End: ?

## 2023-09-12 ENCOUNTER — Encounter: Admit: 2023-09-12 | Discharge: 2023-09-12 | Payer: MEDICARE

## 2023-09-15 ENCOUNTER — Encounter: Admit: 2023-09-15 | Discharge: 2023-09-15 | Payer: MEDICARE

## 2023-09-16 ENCOUNTER — Encounter: Admit: 2023-09-16 | Discharge: 2023-09-16 | Payer: MEDICARE

## 2023-09-16 MED ORDER — METOPROLOL SUCCINATE 50 MG PO TB24
50 mg | ORAL_TABLET | Freq: Every day | ORAL | 1 refills | 90.00000 days | Status: AC
Start: 2023-09-16 — End: ?

## 2023-09-17 ENCOUNTER — Encounter: Admit: 2023-09-17 | Discharge: 2023-09-17 | Payer: MEDICARE

## 2023-09-24 ENCOUNTER — Encounter: Admit: 2023-09-24 | Discharge: 2023-09-24 | Payer: MEDICARE

## 2023-09-24 ENCOUNTER — Ambulatory Visit: Admit: 2023-09-24 | Discharge: 2023-09-25 | Payer: MEDICARE

## 2023-09-24 DIAGNOSIS — I10 Essential (primary) hypertension: Secondary | ICD-10-CM

## 2023-09-24 DIAGNOSIS — R600 Localized edema: Secondary | ICD-10-CM

## 2023-09-24 DIAGNOSIS — E785 Hyperlipidemia, unspecified: Secondary | ICD-10-CM

## 2023-09-24 DIAGNOSIS — E66812 Class 2 severe obesity with serious comorbidity and body mass index (BMI) of 36.0 to 36.9 in adult, unspecified obesity type (CMS-HCC): Secondary | ICD-10-CM

## 2023-09-24 DIAGNOSIS — M4327 Fusion of spine, lumbosacral region: Secondary | ICD-10-CM

## 2023-09-24 MED ORDER — FUROSEMIDE 20 MG PO TAB
20 mg | ORAL_TABLET | Freq: Every day | ORAL | 1 refills | 90.00000 days | Status: AC | PRN
Start: 2023-09-24 — End: ?

## 2023-09-25 DIAGNOSIS — E1142 Type 2 diabetes mellitus with diabetic polyneuropathy: Secondary | ICD-10-CM

## 2023-10-06 ENCOUNTER — Encounter: Admit: 2023-10-06 | Discharge: 2023-10-06 | Payer: MEDICARE

## 2023-10-08 ENCOUNTER — Encounter: Admit: 2023-10-08 | Discharge: 2023-10-08 | Payer: MEDICARE

## 2023-10-20 ENCOUNTER — Encounter: Admit: 2023-10-20 | Discharge: 2023-10-20 | Payer: MEDICARE

## 2023-10-21 ENCOUNTER — Encounter: Admit: 2023-10-21 | Discharge: 2023-10-21 | Payer: MEDICARE

## 2023-10-22 ENCOUNTER — Encounter: Admit: 2023-10-22 | Discharge: 2023-10-22 | Payer: MEDICARE

## 2023-11-03 ENCOUNTER — Encounter: Admit: 2023-11-03 | Discharge: 2023-11-03 | Payer: MEDICARE

## 2023-11-06 ENCOUNTER — Encounter: Admit: 2023-11-06 | Discharge: 2023-11-06 | Payer: MEDICARE

## 2023-11-08 ENCOUNTER — Encounter: Admit: 2023-11-08 | Discharge: 2023-11-08 | Payer: MEDICARE

## 2023-11-08 ENCOUNTER — Ambulatory Visit: Admit: 2023-11-08 | Discharge: 2023-11-09 | Payer: MEDICARE

## 2023-11-08 DIAGNOSIS — A419 Sepsis, unspecified organism: Secondary | ICD-10-CM

## 2023-11-08 DIAGNOSIS — Z9049 Acquired absence of other specified parts of digestive tract: Secondary | ICD-10-CM

## 2023-11-08 DIAGNOSIS — Z09 Encounter for follow-up examination after completed treatment for conditions other than malignant neoplasm: Principal | ICD-10-CM

## 2023-11-08 DIAGNOSIS — N433 Hydrocele, unspecified: Secondary | ICD-10-CM

## 2023-11-08 MED ORDER — METFORMIN 1,000 MG PO TAB
1000 mg | ORAL_TABLET | Freq: Two times a day (BID) | ORAL | 3 refills | 90.00000 days | Status: AC
Start: 2023-11-08 — End: ?

## 2023-11-08 NOTE — Progress Notes
 Date of Service: 11/08/2023    Victor Graham is a 73 y.o. male.  DOB: 1951/03/18  MRN: [id number]     Subjective:               Patient is here with his wife.  He was in [place], [place] on vacation on 7/26 and he began to feel ill and experience abdominal pain, fevers, chills and nausea.  He was taken to the ED and found to have a perforated appendix and sepsis.  He underwent an appendectomy and treated with antibiotics.  After cultures returned, his antibiotics were adjusted.  He reports he is recovering but still experiences fatigue, abdominal pain and diarrhea.  He is able to tolerate soft, bland foods.  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  He reports since his surgery/illness he noticed his hydrocele has enlarged.  He reports it is painful when pressed but otherwise is not symptomatic.  He reports it is improving in size.         Assessment and Plan:  1. Hospital discharge follow-up (Primary)  2. History of laparoscopic appendectomy  3. Sepsis, due to unspecified organism, unspecified whether acute organ dysfunction present (CMS-HCC)  - I have reviewed the patient's medical history in detail and updated the computerized patient record.  - he appears to be much improved.  I reviewed anticipated course of recovery.  He is not able to drive yet because of his limitations in his core and arms.  He will consider in 2 weeks and practice in his driveway without the car on first.  I reviewed signs/symptoms to monitor for.  - he is going to slowly increase his food intake and introduce different foods.      4. 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  - I anticipate this will continue to improve and return to baseline.  If not, he will let us  know and we can order an US     5. Type 2 diabetes mellitus with diabetic polyneuropathy, without long-term current use of insulin (CMS-HCC)  - I have reviewed the patient's medical history in detail and updated the computerized patient record.  - he is due for a refill.  I reviewed with the patient their current medications and specifically any new medications prescribed at the time of this visit and we reviewed the expected benefits and potential side effects. All questions are answered to the patient's satisfaction.   - metFORMIN  (GLUCOPHAGE ) 1,000 mg tablet; Take one tablet by mouth twice daily after meals.  Dispense: 90 tablet; Refill: 3                    Review of Systems      Objective:         ? alendronate (FOSAMAX) 70 mg tablet Take one tablet by mouth every 7 days.   ? ciprofloxacin (CIPRO) 500 mg tablet Take one tablet by mouth twice daily.   ? cyclobenzaprine (FLEXERIL) 10 mg tablet Take one tablet by mouth every 8 hours as needed.   ? ezetimibe  (ZETIA ) 10 mg tablet Take one tablet by mouth daily.   ? FEROSUL 325 mg (65 mg iron) tablet    ? furosemide  (LASIX ) 20 mg tablet Take one tablet by mouth daily as needed.   ? leuprolide 3 month (LUPRON DEPOT (3 MONTH)) 11.25 mg/1.5 mL injection every 90 days.   ?  lisinopriL  (ZESTRIL ) 5 mg tablet Take one tablet by mouth daily.   ? metFORMIN  (GLUCOPHAGE ) 1,000 mg tablet Take one tablet by mouth twice daily after meals.   ? metoprolol  succinate XL (TOPROL  XL) 50 mg extended release tablet Take one tablet by mouth daily. Take 25 mg daily for one week then 50 mg daily   ? oxyCODONE  10 mg tablet Take one tablet by mouth every 4 hours as needed.   ? POTASSIUM CHLORIDE PO Take 90 mg by mouth daily.   ? pregabalin  (LYRICA ) 200 mg capsule Take one capsule by mouth twice daily.   ? rivaroxaban  (XARELTO ) 20 mg tablet Take one tablet by mouth daily with dinner.   ? rosuvastatin  (CRESTOR ) 20 mg tablet Take one tablet by mouth daily.   ? semaglutide  (OZEMPIC ) 2 mg/dose (8 mg/3 mL) injection PEN Inject two mg under the skin every 7 days.   ? spironolactone  (ALDACTONE ) 25 mg tablet Take one tablet by mouth daily.   ? tamsulosin  (FLOMAX ) 0.4 mg capsule Take one capsule by mouth daily. Do not crush, chew or open capsules. Take 30 minutes following the same meal each day.   ? traMADoL  (ULTRAM ) 50 mg tablet Take one tablet by mouth every 8 hours as needed.     Vitals:    11/08/23 1113   BP: 108/68   BP Source: Arm, Right Upper   Pulse: 78   Temp: 36.9 ?C (98.4 ?F)   SpO2: 97%   TempSrc: Temporal   PainSc: Three   Weight: 116.4 kg (256 lb 9.6 oz)   Height: (P) 177.8 cm (5' 10)     Body mass index is 36.82 kg/m? (pended).     Physical Exam  Vitals reviewed.   Constitutional:       Appearance: Normal appearance. Khiry is well-developed.   HENT:      Head: Normocephalic and atraumatic.      Nose: Nose normal.   Eyes:      Conjunctiva/sclera: Conjunctivae normal.   Cardiovascular:      Rate and Rhythm: Normal rate.   Pulmonary:      Effort: Pulmonary effort is normal.   Abdominal:       Genitourinary:     Penis: Normal.       Testes:         Right: Testicular hydrocele present.         Left: Testicular hydrocele not present.   Skin:     General: Skin is warm.      Capillary Refill: Capillary refill takes less than 2 seconds.      Findings: No rash.   Neurological:      Mental Status: Jedaiah is alert. Mental status is at baseline.      Motor: Weakness present.      Coordination: Coordination abnormal.      Gait: Gait abnormal.   Psychiatric:         Behavior: Behavior normal.         Thought Content: Thought content normal.         Judgment: Judgment normal.

## 2023-11-09 DIAGNOSIS — E1142 Type 2 diabetes mellitus with diabetic polyneuropathy: Secondary | ICD-10-CM

## 2023-12-11 ENCOUNTER — Encounter: Admit: 2023-12-11 | Discharge: 2023-12-11 | Payer: MEDICARE

## 2023-12-13 ENCOUNTER — Encounter: Admit: 2023-12-13 | Discharge: 2023-12-13 | Payer: MEDICARE

## 2023-12-19 ENCOUNTER — Encounter: Admit: 2023-12-19 | Discharge: 2023-12-19 | Payer: MEDICARE

## 2023-12-19 ENCOUNTER — Ambulatory Visit: Admit: 2023-12-19 | Discharge: 2023-12-19 | Payer: MEDICARE

## 2023-12-20 ENCOUNTER — Ambulatory Visit: Admit: 2023-12-20 | Discharge: 2023-12-21 | Payer: MEDICARE

## 2023-12-20 ENCOUNTER — Encounter: Admit: 2023-12-20 | Discharge: 2023-12-20 | Payer: MEDICARE

## 2023-12-20 VITALS — BP 96/62 | HR 85 | Ht 70.0 in | Wt 263.7 lb

## 2023-12-20 DIAGNOSIS — I429 Cardiomyopathy, unspecified: Secondary | ICD-10-CM

## 2023-12-20 NOTE — Progress Notes
 [facility]

## 2023-12-20 NOTE — Progress Notes
 Date of Service: 12/20/2023    Victor Graham is a 73 y.o. male.       HPI    Victor Graham is a pleasant 73 year old gentleman, normally followed in our clinic for his mild nonischemic cardiomyopathy, mild to moderate coronary disease, essential hypertension, prior ascending aortic aneurysm repair and dyslipidemia.       He underwent 10-hour low back surgery.  His back pain is much improved.  He tells me that he may have to undergo another surgery for lumbar spinal stenosis.  In summary this year, he was vacationing in [place] when he had to undergo emergent appendectomy.      He denies any chest pains or undue shortness of breath with the limited activity that he is able to perform.  The latest stress test is from March 2025 showing a mixed pattern defect in the RCA territory along with an EF of 49%.  Previous remote coronary angiography had shown mild nonobstructive CAD.  The echo from 2024 done at [facility] had shown an EF of 50 to 55%, moderate LA enlargement, mild aortic insufficiency, mild mitral regurgitation    The ascending aortic aneurysm repair was many years ago.  The last CT done in 2022-noncontrast study, appearance was similar to the one done in 2020.  The previous 2020 study was reviewed by  Dr. Meriel of [facility] personally and he felt that there is nothing to worry about.     Cholesterol check in July 2024 was excellent.     The Xarelto  is being taken chronically for PE.     He does not smoke.            Vitals:    12/20/23 1050   BP: 96/62   BP Source: Arm, Left Upper   Pulse: 85   SpO2: 95%   PainSc: Zero   Weight: 119.6 kg (263 lb 11.2 oz)   Height: 177.8 cm (5' 10)     Body mass index is 37.84 kg/m?SABRA     Past Medical History  Patient Active Problem List    Diagnosis Date Noted    Fusion of lumbosacral spine 09/24/2023    Edema of both lower extremities 09/24/2023    ILD (interstitial lung disease) (CMS-HCC) 02/01/2023    Lumbar stenosis with neurogenic claudication 09/10/2022    Malignant neoplasm of prostate (CMS-HCC) 04/06/2022     [facility], Dr Creighton Barlow,      Carpal tunnel syndrome of left wrist 07/06/2021     emg by Dr Deward Boll at [facility] shows severe median neuropathy      Leg swelling 01/12/2021    Nummular eczema 10/24/2020    Type 2 diabetes mellitus with diabetic polyneuropathy, without long-term current use of insulin (CMS-HCC) 10/24/2020    History of deep venous thrombosis 03/24/2019    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 Started Rapaflo 8mg   01/26/14 - LUTS much improved. Continue Rapaflo 8mg   02/15/15 - stable LUTS      08/16/15- Minimal nocturia and no other symptoms. Interested in trialing discontinuation of his Rapaflo. He will call if his symptoms worsen and we can refill his medications over the phone.  08/14/16 - symptoms stable, PVR 64mL       Class 2 severe obesity with serious comorbidity and body mass index (BMI) of 36.0 to 36.9 in adult (CMS-HCC) 10/28/2013    Cardiomyopathy, idiopathic (CMS-HCC) 06/06/2009     4/11 repeat echo EF 45%, mild AI  2/11 echo from PCPs office EF 35%. New finding compared to prior EF 45-50% in '09 (by echo at [facility]), LV gram from '09 reviewed- EF 45%      Status post thoracic aortic aneurysm repair 07/12/2008     3/10-   30 mm Hemashield graft used to replace the ascending aorta  02/2019- Stable focal dissection flap originating from the superior aspect of   the ascending aortic graft. Has been personally reviewed by Dr Meriel and no concerns      Essential hypertension 01/08/2008     09-17-14: Well controlled. Taking Lisinopril  10 mg daily. Coreg  25 mg 0.5 tablet BID  Well controlled      Hyperlipidemia 01/08/2008     10-08-14: TC 164, HDL 35, TG 237. On Zocor  20 mg  07-07-14: TC 209, LDL 234, HDL 45, TG 234  11/14 TC 153, LDL 74, HDL 45, TG 172  1/14 good numbers  5/13 TC 152, LDL 76, HDL 39, TG 183  2/11 TC 147, LDL 81, HDL 42, TG 138  Panel checked in 6/10- TC 144, LDL 87, HDL 36, TG 131, on simva 40 qhs, intolerant of niacin      CAD (coronary artery disease) 01/08/2008     Mild to moderate, non-obstructive by angio in 10/09 done following an abnormal stress thallium            Review of Systems   Constitutional: Negative.   HENT: Negative.     Eyes: Negative.    Cardiovascular: Negative.    Respiratory: Negative.     Endocrine: Negative.    Hematologic/Lymphatic: Negative.    Skin: Negative.    Musculoskeletal: Negative.    Gastrointestinal: Negative.    Genitourinary: Negative.    Neurological: Negative.    Psychiatric/Behavioral: Negative.     Allergic/Immunologic: Negative.        Physical Exam    General Appearance: no acute distress  Skin: warm, moist, no ulcers  HEENT: unremarkable  Neck Veins: neck veins are flat, neck veins are not distended  Carotid Arteries: normal carotid upstroke bilaterally, no bruits  Chest Inspection: chest is normal in appearance  Auscultation/Percussion: lungs clear   Cardiac Rhythm: regular rhythm and normal rate  Cardiac Auscultation: Normal S1 & S2, no S3 or S4, no rub  Murmurs: soft esm base   Extremities: No significant edema  Abdominal Exam: soft, non-tender, no masses, bowel sounds normal  Neurologic Exam: neurological assessment grossly intact    EKG shows sinus rhythm, nonspecific T wave flattening  Cardiovascular Health Factors  Vitals BP Readings from Last 3 Encounters:   12/20/23 96/62   11/08/23 108/68   09/24/23 101/66     Wt Readings from Last 3 Encounters:   12/20/23 119.6 kg (263 lb 11.2 oz)   11/08/23 116.4 kg (256 lb 9.6 oz)   09/24/23 116 kg (255 lb 12.8 oz)     BMI Readings from Last 3 Encounters:   12/20/23 37.84 kg/m?   11/08/23 (P) 36.82 kg/m?   09/24/23 36.70 kg/m?      Smoking Social History     Tobacco Use   Smoking Status Former    Current packs/day: 0.00    Average packs/day:  2.0 packs/day for 15.0 years (30.0 ttl pk-yrs)    Types: Cigarettes    Start date: 08/23/1976    Quit date: 08/24/1991    Years since quitting: 32.3    Passive exposure: Past   Smokeless Tobacco Never      Lipid Profile Cholesterol   Date Value Ref Range Status   11/01/2022 98 <200 MG/DL Final     HDL   Date Value Ref Range Status   11/01/2022 42 >40 MG/DL Final     LDL   Date Value Ref Range Status   11/01/2022 44 <100 mg/dL Final     Triglycerides   Date Value Ref Range Status   11/01/2022 131 <150 MG/DL Final      Blood Sugar Hemoglobin A1C   Date Value Ref Range Status   10/30/2021 8.5 (H) 4.0 - 5.7 % Final     Comment:     The ADA recommends that most patients with type 1 and type 2 diabetes maintain   an A1c level <7%.       Glucose   Date Value Ref Range Status   09/11/2023 132  Final   11/14/2022 172 (H) 70 - 100 MG/DL Final   92/74/7975 858 (H) 70 - 100 MG/DL Final     Glucose Fasting   Date Value Ref Range Status   09/01/2015 117 (H) 70 - 100 MG/DL Final     Glucose, POC   Date Value Ref Range Status   12/22/2021 164 (A) 70 - 100 Final   12/22/2021 197 (A) 70 - 100 Final     Glucose POC   Date Value Ref Range Status   03/18/2019 99 65 - 110 mg/dL Final          Problems Addressed Today  Encounter Diagnoses   Name Primary?    Essential hypertension Yes    Hyperlipidemia, unspecified hyperlipidemia type     Cardiomyopathy, idiopathic (CMS-HCC)              Assessment and Plan     In summary, Victor Graham is a 73 year old gentleman with the following cardiac and related issues:      History of mild nonischemic cardiomyopathy, last outside echo in 2024 had shown an EF of 50%, currently appears to be euvolemic.  He is using furosemide  20 mg daily.           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   Dyslipidemia-  on Crestor  with excellent LDL control as of 10/2022.    PVCs-currently asymptomatic.  Continue beta-blockers     History of ascending aortic aneurysm repair in 2010.  ? residual dissection reported in 2020.    Unchanged noncontrast CT in 2022.  CT with contrast done in 2024 and results are acceptable.    DM2, followed by Dr. Rollen     S/p extensive back surgery, -pain is much improved at this time        It was a pleasure to see Victor Graham in the clinic today.           Current Medications (including today's revisions)   alendronate (FOSAMAX) 70 mg tablet Take one tablet by mouth every 7 days.    ezetimibe  (ZETIA ) 10 mg tablet Take one tablet by mouth daily.    FEROSUL 325 mg (65 mg iron) tablet     furosemide  (LASIX ) 20 mg  tablet Take one tablet by mouth daily as needed.    leuprolide 3 month (LUPRON DEPOT (3 MONTH)) 11.25 mg/1.5 mL injection every 90 days.    lisinopriL  (ZESTRIL ) 5 mg tablet Take one tablet by mouth daily.    metFORMIN  (GLUCOPHAGE ) 1,000 mg tablet Take one tablet by mouth twice daily after meals.    metoprolol  succinate XL (TOPROL  XL) 50 mg extended release tablet Take one tablet by mouth daily. Take 25 mg daily for one week then 50 mg daily    POTASSIUM CHLORIDE PO Take 90 mg by mouth daily.    pregabalin  (LYRICA ) 200 mg capsule Take one capsule by mouth twice daily.    rivaroxaban  (XARELTO ) 20 mg tablet Take one tablet by mouth daily with dinner.    rosuvastatin  (CRESTOR ) 20 mg tablet Take one tablet by mouth daily.    semaglutide  (OZEMPIC ) 2 mg/dose (8 mg/3 mL) injection PEN Inject two mg under the skin every 7 days.    spironolactone  (ALDACTONE ) 25 mg tablet Take one tablet by mouth daily.    tamsulosin  (FLOMAX ) 0.4 mg capsule Take one capsule by mouth daily. Do not crush, chew or open capsules. Take 30 minutes following the same meal each day.

## 2023-12-20 NOTE — Patient Instructions
 Thank you for visiting our office today, we want to provide you with the best possible care.     Dr Jerrie would like to see you again in 6-8 mo.     Please complete the following orders:     Lipid panel if it's not been done  Take your medications as prescribed.   Please carefully check your list with what you have on hand at home and notify us  of any changes or updates.     Please call or mychart message with any additional questions or concerns.    For questions: please Dr. Peggi nursing line at [phone] Monday - Friday 8-5 only. [facility] [address])        Please leave a detailed message with your name, date of birth, and reason for your call.  A nurse will return your call as soon as possible    Scheduling: Please call [phone]    After hours: [phone]    For all medication refills: please contact your pharmacy.      It was truly our pleasure seeing you today. Thank you for choosing [facility] for your heart health!

## 2023-12-21 DIAGNOSIS — E785 Hyperlipidemia, unspecified: Secondary | ICD-10-CM

## 2023-12-21 DIAGNOSIS — I10 Essential (primary) hypertension: Principal | ICD-10-CM

## 2023-12-22 ENCOUNTER — Encounter: Admit: 2023-12-22 | Discharge: 2023-12-22 | Payer: MEDICARE

## 2023-12-23 ENCOUNTER — Encounter: Admit: 2023-12-23 | Discharge: 2023-12-23 | Payer: MEDICARE

## 2023-12-23 DIAGNOSIS — I10 Essential (primary) hypertension: Principal | ICD-10-CM

## 2023-12-23 MED ORDER — LISINOPRIL 5 MG PO TAB
5 mg | ORAL_TABLET | Freq: Every day | ORAL | 1 refills | 90.00000 days | Status: AC
Start: 2023-12-23 — End: ?

## 2023-12-24 ENCOUNTER — Encounter: Admit: 2023-12-24 | Discharge: 2023-12-24 | Payer: MEDICARE

## 2023-12-24 ENCOUNTER — Ambulatory Visit: Admit: 2023-12-24 | Discharge: 2023-12-24 | Payer: MEDICARE

## 2023-12-24 VITALS — BP 101/62 | HR 87 | Temp 97.90000°F | Resp 16 | Ht 70.0 in | Wt 255.0 lb

## 2023-12-24 DIAGNOSIS — J849 Interstitial pulmonary disease, unspecified: Principal | ICD-10-CM

## 2023-12-24 DIAGNOSIS — R768 Other specified abnormal immunological findings in serum: Secondary | ICD-10-CM

## 2023-12-24 DIAGNOSIS — J984 Other disorders of lung: Secondary | ICD-10-CM

## 2023-12-24 NOTE — Progress Notes
 Date of Service: 12/24/2023    Subjective:             Casanova Schurman is a 73 y.o. male.    This is a 73 y.o. year old male who presents to the [facility] for further evaluation.    To summarize his history,     Mr. Jacinto was referred to our clinic after he had abnormal imaging findings on a CT scan.  Overall, he notes that he does not have any significant respiratory symptoms.  He denies any notable shortness of breath.  He has a rare cough. He is not on any supplemental oxygen.    He was subsequently referred to the [facility] for further evaluation.    I screened him for autoimmune symptoms that might suggest an autoimmune featured interstitial lung disease.  He reports arthralgias, but denies any synovitis.  He reports significant dry mouth symptoms but denies other sicca symptoms.  He denies GERD symptoms, but reports occasional food sticking to suggest esophageal dysphagia.  He denies raynaud's phenomenon, sclerodactyly, or hyperkeratosis.  He denies any muscle weakness or tenderness. He denies any significant skin rashes or ulcerations.     I also screened him for environmental exposures that might suggest a chronic hypersensitivity pneumonitis.  He denies having birds in the home.  Denies the use of feather pillows or a down comforter.  He does reports that he participates in woodworking as a hobby and has routine exposure polyurethane or isocyanate, but states he wears a respirator when using chemicals or is around dust.  He does report a history of water damage in the home.  He had this remediatied and mold growth is not obvious.  He denies the use of a humidifier or hot tub.  He doesn't live near any industrial or agricultural facilities.  There are no obvious exposures to any other significant respiratory toxins.    Potential drug exposures were also reviewed to exclude a drug induced cause.  He denies the use of chronic nitrofurantoin, methotrexate or amiodarone.  He denies radiation exposure.     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  His prior ANA was markedly elevated.  He denies being diagnosed with an autoimmune disease.     _____________________________________________________________    Mr. Errico continues to do well from a respiratory standpoint. No notable respiratory symptoms. He has had other medical issues, however. PFTs and CT stable.      Past Medical History:    Adverse drug reaction    Aneurysm    Arthritis    Ascending aortic aneurysm    Asymptomatic PVCs    Blood clotting disorder    CAD (coronary artery disease)    Cardiac dysrhythmia    Carpal tunnel syndrome of left wrist    Cataract    Constipation    Degenerative disc disease, lumbar    Diabetes (CMS-HCC)    Dyslipidemia    Erectile dysfunction    Fatty liver    Heart murmur    HLD (hyperlipidemia)    HX: anticoagulation    Hypertension    Hypertriglyceridemia    Idiopathic polyneuropathy    Joint pain    Lumbar radiculopathy    Nerve injury    NICM (nonischemic cardiomyopathy) (CMS-HCC)    Nonrheumatic aortic valve insufficiency    Obesity    Osteoporosis    Other malignant neoplasm without specification of site  Pneumonia    Positive ANA (antinuclear antibody)    Positive ANA (antinuclear antibody)    Pulmonary embolism (CMS-HCC)    Spinal headache    Spinal stenosis       SOCIAL HISTORY:  Former Smoker. 36 pack years. He denies significant alcohol or illicit substances.    FAMILY HISTORY:  Pulmonary fibrosis or autoimmune diseases does not run in the family       Review of Systems  A full 14 point review of systems was performed and is as above or is unremarkable.      Objective:          alendronate (FOSAMAX) 70 mg tablet Take one tablet by mouth every 7 days.    ezetimibe  (ZETIA ) 10 mg tablet TAKE 1 TABLET BY MOUTH DAILY    FEROSUL 325 mg (65 mg iron) tablet     furosemide  (LASIX ) 20 mg tablet Take one tablet by mouth daily as needed. leuprolide 3 month (LUPRON DEPOT (3 MONTH)) 11.25 mg/1.5 mL injection every 90 days.    lisinopril  (ZESTRIL ) 5 mg tablet Take one tablet by mouth daily.    metFORMIN  (GLUCOPHAGE ) 1,000 mg tablet Take one tablet by mouth twice daily after meals.    metoprolol  succinate XL (TOPROL  XL) 50 mg extended release tablet Take one tablet by mouth daily. Take 25 mg daily for one week then 50 mg daily    POTASSIUM CHLORIDE PO Take 90 mg by mouth daily.    pregabalin  (LYRICA ) 200 mg capsule Take one capsule by mouth twice daily.    rivaroxaban  (XARELTO ) 20 mg tablet Take one tablet by mouth daily with dinner.    rosuvastatin  (CRESTOR ) 20 mg tablet Take one tablet by mouth daily.    semaglutide  (OZEMPIC ) 2 mg/dose (8 mg/3 mL) injection PEN Inject two mg under the skin every 7 days.    spironolactone  (ALDACTONE ) 25 mg tablet Take one tablet by mouth daily.    tamsulosin  (FLOMAX ) 0.4 mg capsule Take one capsule by mouth daily. Do not crush, chew or open capsules. Take 30 minutes following the same meal each day.     Vitals:    12/24/23 1259   BP: 101/62   BP Source: Arm, Right Upper   Pulse: 87   Temp: 36.6 ?C (97.9 ?F)   Resp: 16   SpO2: 96%   TempSrc: Oral   Weight: 115.7 kg (255 lb)   Height: 177.8 cm (5' 10)     Body mass index is 36.59 kg/m?SABRA     Physical Exam   GENERAL:  Alert and Pleasant, No Distress  HEENT: PERRL, EOMI  NECK: No Cervical or Supraclavicular Adenopathy, No Thyromegaly  CVS:  Regular Rate and Rhythm  LUNGS: Bibasilar crackles, No Wheezes  ABDOMEN: Soft, Nontender, No Hepatosplenomegaly  EXTREMITIES: No edema.  SKIN: No rashes. No ulcers.  NEURO: Grossly normal    REVIEW OF DATA:    Pulmonary Function Tests:   Complete PFT Absolute FVC-Pre FEV1-Pre RVPleth-Pre TLCPleth-Pre DLCOunc-%Pred-Pre   Latest Ref Rng & Units L L L L %   01/06/2019   1:44 PM 3.04  2.53  2.07  5.17  85    07/07/2019  12:35 PM 3.12  2.64  1.87  5.15  104    03/01/2020  11:37 AM 2.96  2.50  1.97  4.91  86    08/23/2020   1:52 PM 2.66  2.20 2.01  4.83  92    01/17/2021   9:41 AM 2.90  2.46  2.03  5.12  92    07/18/2021  11:47 AM 3.01  2.52  2.03  5.02  103    11/26/2022  10:45 AM 2.84  2.45  2.15  5.14  84    12/24/2023  10:39 AM 3.09  P 2.55  P 1.91  P 5.13  P 92  P      P Preliminary result     Complete PFT Percent Predicted FVC-%Pred-pre FEV1-%Pred-Pre RVPleth-%Pred-Pre TLCPleth-%Pred-Pre DLCOunc-%Pred-Pre   Latest Ref Rng & Units % % % % %   01/06/2019   1:44 PM 75  83  89  78  85    07/07/2019  12:35 PM 78  86  80  77  104    03/01/2020  11:37 AM 73  81  84  74  86    08/23/2020   1:52 PM 67  72  85  73  92    01/17/2021   9:41 AM 73  81  86  77  92    07/18/2021  11:47 AM 76  84  85  75  103    11/26/2022  10:45 AM 77  88  89  77  84    12/24/2023  10:39 AM 84  P 92  P 78  P 77  P 92  P      P Preliminary result            Chest Imaging:  Chest CT:    IMPRESSION     1. Healed medial sternotomy with supracoronary ascending aortic repair   extending to zone 0.     2. Sinuses of Valsalva measure up to 4.0 cm measured orthogonal to the   long axis from cusp to cusp or cusp to commissure.     3. Peripheral and basilar predominant reticulation with mild volume loss   and architectural distortion suggestive of fibrosis which may be slightly   progressed from 2022 although in part could be related to differences in   technique. Consider correlation with symptoms and pulmonary function   testing.     Cardiac Data:  Echocardiogram:  Interpretation Summary     Rest Echo:   Overall left ventricular systolic function is normal. EF~ 55%  Abnormal septal motion  Valves appear structurally normal without signficant stenosis or regurgitation  No significant pericardial effusion  Normal ventricular chamber dimensions  Mild left and right atrial enlargement  Normal LV wall thickness  Normal diastolic function  Mild aortic root dilation  Estimated peak systolic PA pressure =  25 mmHg         Pathology Data:  None    Lab Data:  Labs:  Results for ROMMEL, HOGSTON (MRN [id number]) as of 01/09/2019 10:10   Ref. Range 09/01/2015 07:52 03/30/2016 16:26 06/25/2016 09:34 03/20/2017 15:40 09/11/2017 14:51 01/06/2019 15:47   Alternaria Alternata Unknown      <2.0.SABRASABRA   Aspergillus Fumigatus IgG Unknown      16.7   Aureobasidium Pullulans Unknown      3.7   Micropolyspora Faeni Unknown      <2.0.SABRASABRA   Penicillium Notatum Unknown      13.9   Phoma Herbarum Latest Units: MCG/ML      2.6   Thermoactomyces Vulgaris Unknown      5.8   Trichoderma Viride Unknown      5.3   DNA Double Strand AB Latest Ref Range: <10 TITER    <10     ANA Screen Latest  Ref Range: <80 TITER SEE TITER     SEE TITER   ANA Titer/Pattern Latest Ref Range: <80  > OR = 1280     320 (H)   Anti-Smith Latest Ref Range: <1.0 AI     <0.2    Anti-RNP Latest Ref Range: <1.0 AI     <0.2    Anti-SSA Latest Ref Range: <1.0 AI     <0.2 <0.2   Anti-SSB Latest Ref Range: <1.0 AI     <0.2 <0.2   Rheum Factor Screen Latest Ref Range: <25 IU/mL <20 <20    <10   Centromere Antibody Unknown  <0.2.SABRASABRA       Myeloperoxidase AB Latest Ref Range: <1.0 AI      <0.2   Serine Protease3 AB Latest Ref Range: <1.0 AI      <0.2   Complemnt C3 Latest Ref Range: 88 - 200 MG/DL    868.9     Complemnt C4 Latest Ref Range: 10 - 49 MG/DL    58.9     Jo 1 Antibody Latest Ref Range: <1.0 AI      <0.2   SCL70 Ab Latest Ref Range: <1.0 AI  <0.2...    <0.2   BETA-2 GLY 1 AB ICG Latest Ref Range: 0 - 20 SGU  1.7       BETA-2 GLY 1 AB IGM Latest Ref Range: 0 - 20 SMU  7.2       CCP IgG Antibody Latest Ref Range: <3.0 U/mL  <0.5    <0.5   Cardiolipin, IgG Latest Ref Range: <20.0 GPL/ML  1.2 <1.6      Cardiolipin, IgM Latest Ref Range: <20.0 MPL/ML  21.8 (H) 12.1      Dilute Russell Viper Venom Latest Ref Range: <1.3 RATIO  1.2       Hexagonal Lupus Anticoagulant Unknown  14 (H) 16 (H)      IgG Latest Ref Range: 762 - 1,488 MG/DL      147   IgM Latest Ref Range: 38 - 328 MG/DL      854   IgA Latest Ref Range: 70 - 390 MG/DL      756                Assessment and Plan:  This is a 73 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    PROBLEM LIST:  1.  Interstitial Lung Disease  2.  Interstitial Lung Abnormalities - UIP/IPF vs non-progressive finding found with age.  3.  Positive ANA  4.  History of VTE  5.  Mild restrictive lung disease    PLAN:  Continue to monitor  F/u in 2 years with PFTs    Kadyn Chovan S. Shona, MD  Associate Professor  Associate Director of the [facility] [place]  [facility]  Division of Pulmonary & Critical Care Medicine  [address]  [address]  [place], [place] [address]  Phone: [phone]  Fax: [phone]    Total Time Today was 30 minutes in the following activities: Preparing to see the patient, Obtaining and/or reviewing separately obtained history, Performing a medically appropriate examination and/or evaluation, Counseling and educating the patient/family/caregiver, Ordering medications, tests, or procedures, Referring and communication with other health care professionals (when not separately reported), Documenting clinical information in the electronic or other health record, Independently interpreting results (not separately reported) and communicating  results to the patient/family/caregiver, and Care coordination (not separately reported)

## 2023-12-24 NOTE — Patient Instructions
 Clinic Visit Summary:     Next clinic visit follow up with Dr Shona recommended in 2 months with PFTs, at any location.     Please contact Pulmonary Nurse Coordinator with signs and symptoms of worsening productive cough with thick secretions, blood in sputum, chest tightness/pain, shortness of breath, fever, chills, night sweats, or any questions or concerns.     ILD/Sarcoidosis Clinical Care Coordinators: Warren League, RN [phone], Beryl Pfeiffer, RN [phone], Wilkie Mall, RN [phone], and Alejandra Pipe RN [phone].    For refills on medications, please have your pharmacy fax a refill authorization request form to our office at Fax) [phone]. Please allow at least 3 business days for refill requests.   For urgent issues after business hours/weekends/holidays call [phone] and request for the pulmonary fellow to be paged. For scheduling questions/concerns, please call [phone].

## 2023-12-25 ENCOUNTER — Encounter: Admit: 2023-12-25 | Discharge: 2023-12-25 | Payer: MEDICARE

## 2023-12-26 ENCOUNTER — Encounter: Admit: 2023-12-26 | Discharge: 2023-12-26 | Payer: MEDICARE

## 2023-12-26 DIAGNOSIS — E1142 Type 2 diabetes mellitus with diabetic polyneuropathy: Principal | ICD-10-CM

## 2024-01-03 ENCOUNTER — Encounter: Admit: 2024-01-03 | Discharge: 2024-01-03 | Payer: MEDICARE

## 2024-01-05 ENCOUNTER — Encounter: Admit: 2024-01-05 | Discharge: 2024-01-05 | Payer: MEDICARE

## 2024-01-09 ENCOUNTER — Encounter: Admit: 2024-01-09 | Discharge: 2024-01-09 | Payer: MEDICARE

## 2024-01-09 ENCOUNTER — Ambulatory Visit: Admit: 2024-01-09 | Discharge: 2024-01-10 | Payer: MEDICARE

## 2024-01-19 ENCOUNTER — Encounter: Admit: 2024-01-19 | Discharge: 2024-01-19 | Payer: MEDICARE

## 2024-01-20 ENCOUNTER — Encounter: Admit: 2024-01-20 | Discharge: 2024-01-20 | Payer: MEDICARE

## 2024-01-20 DIAGNOSIS — E785 Hyperlipidemia, unspecified: Principal | ICD-10-CM

## 2024-01-20 MED ORDER — EZETIMIBE 10 MG PO TAB
10 mg | ORAL_TABLET | Freq: Every day | ORAL | 3 refills | 90.00000 days | Status: AC
Start: 2024-01-20 — End: ?

## 2024-01-26 ENCOUNTER — Encounter: Admit: 2024-01-26 | Discharge: 2024-01-26 | Payer: MEDICARE

## 2024-02-05 ENCOUNTER — Ambulatory Visit: Admit: 2024-02-05 | Discharge: 2024-02-05 | Payer: MEDICARE

## 2024-03-08 ENCOUNTER — Encounter: Admit: 2024-03-08 | Discharge: 2024-03-08 | Payer: MEDICARE

## 2024-03-22 ENCOUNTER — Encounter: Admit: 2024-03-22 | Discharge: 2024-03-22 | Payer: MEDICARE

## 2024-03-23 ENCOUNTER — Encounter: Admit: 2024-03-23 | Discharge: 2024-03-23 | Payer: MEDICARE

## 2024-03-23 MED ORDER — METOPROLOL SUCCINATE 50 MG PO TB24
50 mg | ORAL_TABLET | Freq: Every day | ORAL | 1 refills | 90.00000 days | Status: AC
Start: 2024-03-23 — End: ?

## 2024-04-05 ENCOUNTER — Encounter: Admit: 2024-04-05 | Discharge: 2024-04-05 | Payer: MEDICARE

## 2024-04-06 ENCOUNTER — Encounter: Admit: 2024-04-06 | Discharge: 2024-04-06 | Payer: MEDICARE

## 2024-04-15 ENCOUNTER — Encounter: Admit: 2024-04-15 | Discharge: 2024-04-15 | Payer: MEDICARE

## 2024-04-23 ENCOUNTER — Encounter: Admit: 2024-04-23 | Discharge: 2024-04-23 | Payer: MEDICARE

## 2024-04-28 ENCOUNTER — Encounter: Admit: 2024-04-28 | Discharge: 2024-04-28 | Payer: MEDICARE

## 2024-04-28 DIAGNOSIS — E1142 Type 2 diabetes mellitus with diabetic polyneuropathy: Principal | ICD-10-CM

## 2024-04-28 MED ORDER — TIRZEPATIDE 7.5 MG/0.5 ML SC PNIJ
7.5 mg | SUBCUTANEOUS | 11 refills | 28.00000 days | Status: AC
Start: 2024-04-28 — End: ?

## 2024-04-28 NOTE — Telephone Encounter [36]
 Rcv'd fax from [facility] on 04/27/24 regarding pt's Mounjaro  prescription. Per [facility]- The pt's most recent GLP-1 tx was for ozempic  2 mg weekly dosing which is considered therapeutically equivalent to Mounjaro  5 mg weekly dosing; the next dose in the titration should be 7.5 mg Mounjaro  weekly, not 10 mg weekly. Please advise.

## 2024-04-28 NOTE — Telephone Encounter [36]
 Ok.  Sent in 7.5mg  Mounjaro  order.  Thank you
